# Patient Record
Sex: Male | Born: 1965 | Race: White | Hispanic: No | Marital: Married | Smoking: Former smoker
Health system: Southern US, Community
[De-identification: ages and names within clinical notes are randomized; demographics above are authoritative.]

## PROBLEM LIST (undated history)

## (undated) DIAGNOSIS — E785 Hyperlipidemia, unspecified: Secondary | ICD-10-CM

## (undated) DIAGNOSIS — E119 Type 2 diabetes mellitus without complications: Secondary | ICD-10-CM

## (undated) DIAGNOSIS — E782 Mixed hyperlipidemia: Secondary | ICD-10-CM

## (undated) DIAGNOSIS — U071 COVID-19: Secondary | ICD-10-CM

## (undated) DIAGNOSIS — I10 Essential (primary) hypertension: Secondary | ICD-10-CM

## (undated) DIAGNOSIS — Z87442 Personal history of urinary calculi: Secondary | ICD-10-CM

## (undated) DIAGNOSIS — I2510 Atherosclerotic heart disease of native coronary artery without angina pectoris: Secondary | ICD-10-CM

## (undated) DIAGNOSIS — K219 Gastro-esophageal reflux disease without esophagitis: Secondary | ICD-10-CM

## (undated) HISTORY — DX: COVID-19: U07.1

## (undated) HISTORY — DX: Type 2 diabetes mellitus without complications: E11.9

## (undated) HISTORY — DX: Essential (primary) hypertension: I10

## (undated) HISTORY — DX: Mixed hyperlipidemia: E78.2

## (undated) HISTORY — PX: LACERATION REPAIR: SHX5168

## (undated) HISTORY — DX: Gastro-esophageal reflux disease without esophagitis: K21.9

---
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
## 2007-12-07 ENCOUNTER — Ambulatory Visit (HOSPITAL_COMMUNITY): Admission: RE | Admit: 2007-12-07 | Discharge: 2007-12-07 | Payer: Self-pay | Admitting: Cardiovascular Disease

## 2010-08-03 ENCOUNTER — Ambulatory Visit: Payer: Self-pay | Admitting: Internal Medicine

## 2010-08-03 ENCOUNTER — Encounter (INDEPENDENT_AMBULATORY_CARE_PROVIDER_SITE_OTHER): Payer: Self-pay

## 2010-08-03 DIAGNOSIS — R112 Nausea with vomiting, unspecified: Secondary | ICD-10-CM

## 2010-08-03 DIAGNOSIS — R198 Other specified symptoms and signs involving the digestive system and abdomen: Secondary | ICD-10-CM | POA: Insufficient documentation

## 2010-08-03 DIAGNOSIS — R1013 Epigastric pain: Secondary | ICD-10-CM

## 2010-08-03 DIAGNOSIS — R634 Abnormal weight loss: Secondary | ICD-10-CM

## 2010-08-06 ENCOUNTER — Encounter: Payer: Self-pay | Admitting: Internal Medicine

## 2010-12-25 NOTE — Assessment & Plan Note (Signed)
Summary: STOMACH PAINS,ULCERS/SS   Visit Type:  consult Referring Adhrit Krenz:  [facility] ED Primary Care Tykesha Konicki:  None  Chief Complaint:  abd pain x 1 month.  History of Present Illness: Shawn Hughes is a pleasant 45 y/o WM, who presents here at request of [facility] ED for further evaluation of abd pain.   6 weeks h/o epigastric pain that radiates downward. Pain worse pp. Vomiting 2-3 times per week. Has not noticed obvious blood/black emesis, but ?streaks of blood day seen in ED. He states he doesn't look do to a weak stomach. No heartburn or dysphagia. Lots of gas. BM 3 per day for past few weeks. Used to be once daily. Stools loose to watery. No melena, brbpr. Wakes up at night with abd pain and then has stools. No recent Abx, ill contacts. No travel abroad. When abd pain started took ibuprofen 600mg  three times a day. Symptoms got worse. Rarely takes BCs for headache.  Weight down 3-4 pounds in last few weeks.   Labs done [facility] ED 07/10/10: glu 101, BUN 13, Cre 1.02, Tbili 0.6, AP 66, AST 20, ALT 29, alb 4.2, amylase 40, lipase 24, WBC 8100, H/H 16.1/45.9, Plt 257,000, H.Pylori negative.   Current Medications (verified): 1)  Prevacid 30 Mg Cpdr (Lansoprazole) .... Once Daily 2)  Tramadol Hcl 50 Mg Tabs (Tramadol Hcl) .Marland Kitchen.. 1-2 Q 4 Hours Prn  Allergies (verified): 1)  ! Lortab (Hydrocodone-Acetaminophen)  Past History:  Past Medical History: Kidney stones  Past Surgical History: Right arm, deep laceration.  Family History: No CRC, liver, chronic GI illnesses.  Father, DM.  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 Married. Two children. 1/2ppd. No drugs. No alcohol.   Review of Systems General:  Complains of weight loss; denies fever, chills, sweats, anorexia, fatigue, and weakness. Eyes:  Denies vision loss. ENT:  Denies nasal congestion, sore throat, hoarseness, and difficulty swallowing. CV:  Denies chest pains,  angina, palpitations, dyspnea on exertion, and peripheral edema. Resp:  Denies dyspnea at rest, dyspnea with exercise, cough, sputum, and wheezing. GI:  See HPI. GU:  Denies urinary burning and blood in urine. MS:  Denies joint pain / LOM. Derm:  Denies rash and itching. Neuro:  Denies weakness, frequent headaches, memory loss, and confusion. Psych:  Denies depression and anxiety. Endo:  Complains of unusual weight change. Heme:  Denies bruising and bleeding. Allergy:  Denies hives and rash.  Vital Signs:  Patient profile:   45 year old male Height:      69 inches Weight:      191 pounds BMI:     28.31 Temp:     97.6 degrees F oral Pulse rate:   80 / minute BP sitting:   140 / 100  (left arm) Cuff size:   regular  Vitals Entered By: Hendricks Limes LPN (August 03, 2010 10:45 AM)  Physical Exam  General:  Well developed, well nourished, no acute distress. Head:  Normocephalic and atraumatic. Eyes:  sclera nonicteric Mouth:  Oropharyngeal mucosa moist, pink.  No lesions, erythema or exudate.    Neck:  Supple; no masses or thyromegaly. Lungs:  Clear throughout to auscultation. Heart:  Regular rate and rhythm; no murmurs, rubs,  or bruits. Abdomen:  Soft. Mild epigastric tenderness. No rebound or guarding. No HSM or masses. No abd bruit or hernia.  Extremities:  No clubbing, cyanosis, edema or deformities noted. Neurologic:  Alert and  oriented x4;  grossly normal neurologically.  Skin:  Intact without significant lesions or rashes. Cervical Nodes:  No significant cervical adenopathy. Psych:  Alert and cooperative. Normal mood and affect.  Impression & Recommendations:  Problem # 1:  EPIGASTRIC PAIN (ICD-789.06)  Epigastric pain, worse pp associated with N/V, possible hematemesis, bowel change. DDx includes PUD, gallbladder disease. Doubt celiac disease. More difficulty to tie in change in bowels related to upper gi source, but patient under lot of stress at work and may have  element of IBS. Discussed options with patient including EGD and abd u/s. EGD to be performed in near future.  Risks, alternatives, benefits including but not limited to risk of reaction to medications, bleeding, infection, and perforation addressed.  Patient voiced understanding and verbal consent obtained. If EGD negative, then consider abd u/s as next step.  Dexilant 60mg  daily, #20 given. Patient out of Prevacid. He should call if symptoms worsen.   Orders: Consultation Level III (19147) I would like to thank [facility] ED for allowing [place] to take part in the care of this nice patient.

## 2010-12-25 NOTE — Letter (Signed)
Summary: Out of Work Note  [facility]  [address]   [place], [place] [address]   Phone: [phone]  Fax: [phone]    08/03/2010  TO: Leodis Sias IT MAY CONCERN  RE: Shawn Hughes [address] [id number] Jun 30, 1966       The above named individual was seen in our office today.      If you have any further questions or need additional information, please call.     Sincerely,     [facility] R. Roetta Sessions, M.D.    Jonette Eva, M.D. Lorenza Burton, FNP-BC    Tana Coast, PA-C Phone: [phone]    Fax: [phone]

## 2010-12-25 NOTE — Letter (Signed)
Summary: EGD ORDER  EGD ORDER   Imported By: Ave Filter 08/03/2010 11:28:47  _____________________________________________________________________  External Attachment:    Type:   Image     Comment:   External Document

## 2010-12-25 NOTE — Letter (Signed)
Summary: RECORDS FROM [facility]  RECORDS FROM [facility]   Imported By: Rexene Alberts 08/06/2010 16:36:31  _____________________________________________________________________  External Attachment:    Type:   Image     Comment:   External Document

## 2012-01-08 ENCOUNTER — Encounter: Payer: Self-pay | Admitting: Internal Medicine

## 2012-01-09 ENCOUNTER — Encounter: Payer: Self-pay | Admitting: Gastroenterology

## 2012-01-09 ENCOUNTER — Ambulatory Visit (INDEPENDENT_AMBULATORY_CARE_PROVIDER_SITE_OTHER): Payer: BC Managed Care – PPO | Admitting: Gastroenterology

## 2012-01-09 DIAGNOSIS — R079 Chest pain, unspecified: Secondary | ICD-10-CM | POA: Insufficient documentation

## 2012-01-09 DIAGNOSIS — R1013 Epigastric pain: Secondary | ICD-10-CM

## 2012-01-09 MED ORDER — SODIUM CHLORIDE 0.45 % IV SOLN
Freq: Once | INTRAVENOUS | Status: AC
  Administered 2012-01-10: 15:00:00 via INTRAVENOUS

## 2012-01-09 NOTE — Patient Instructions (Signed)
UPPER ENDOSCOPY TOMORROW.  WE WILL GET YOUR RECORDS FROM [facility].  WE WILL SCHEDULE FOLLOW UP AFTER YOUR UPPER ENDOSCOPY IS DONE.

## 2012-01-09 NOTE — Progress Notes (Signed)
  Subjective:    Patient ID: Shawn Hughes, male    DOB: 23-Apr-1966, 46 y.o.   MRN: [id number]  1o GI: Shawn Hughes PCP: Shawn Hughes  HPI 2009: CHEST PAIN CTA NECK/CHEST NL  Last seen SEP 2011 AT [facility] FOR HEMATEMESIS/ABDOMINAL PAIN, USING BCs-191 LBS. Remembers Prevacid helped and so he did not have EGD. SMOKING 1/2 PPD. NO ETOH.  HAVING CHEST PAIN AND ADMITTED AND DISCHARGED FROM [facility]. Xanax 0.5 mg (1/2 to 1) gave him crazy dreams and couldn't function. HAVING STRESS FROM WORK. He HAS chest pain  in his LEFT chest and moves to left shoulder/neck. HAS DULL PAIN ALL DAY LONG AND WHEN IT GETS SHARP THEN SHOOTS UP INTO HIS NECK. HAS EPIGASTRIC PAIN AS WELL. NO REPEAT IMAGING OR EGD. NO HEARTBURN, NAUSEA, OR VOMITING, OR PROBLEMS SWALLOWING. Unchanged by food. No ASA, BC, GOODYS. USES IBUPROFEN ONCE A DAY. 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 Appetite: poor. NO CHANGE IN BOWEL HABITS. PRILOSEC ONCE A DAY BEFORE MEALS-4 WEEKS AND NO CHANGE IN YOUR PAIN. No injury to back or chest. No blood in Stool, tarry stools, dysuria, hematuria. Cut back on smoking since 4 weeks: 6 or 7 a day.  Past Medical History  Diagnosis Date  . Kidney stone    Past Surgical History  Procedure Date  . Laceration repair     right arm   Allergies  Allergen Reactions  . Hydrocodone-Acetaminophen Itching    Current Outpatient Prescriptions  Medication Sig Dispense Refill  . omeprazole (PRILOSEC) 20 MG capsule Take 20 mg by mouth daily.        Family History  Problem Relation Age of Onset  . Colon cancer Neg Hx   . Colon polyps Neg Hx   . Esophageal cancer Neg Hx   . Pancreatic cancer Neg Hx   . Stomach cancer Neg Hx     History  Substance Use Topics  . Smoking status: Current Everyday Smoker -- 0.5 packs/day    Types: Cigarettes  . Smokeless tobacco: Not on file  . Alcohol Use: No       Review of Systems  All other systems reviewed and are  negative.       Objective:   Physical Exam  Constitutional: He is oriented to person, place, and time. He appears well-developed and well-nourished. No distress.       BREATH SMELL LIKE SMOKE  HENT:  Head: Normocephalic and atraumatic.  Mouth/Throat: Oropharynx is clear and moist. No oropharyngeal exudate.       POOR DENTITION  Eyes: Pupils are equal, round, and reactive to light. No scleral icterus.  Neck: Normal range of motion. Neck supple.  Cardiovascular: Normal rate and normal heart sounds.   Pulmonary/Chest: Effort normal and breath sounds normal. No respiratory distress.  Abdominal: Soft. Bowel sounds are normal. He exhibits no distension. There is tenderness (MILD TTP IN EPIGASTRIUM). There is no rebound and no guarding.  Musculoskeletal: Normal range of motion. He exhibits no edema.  Lymphadenopathy:    He has no cervical adenopathy.  Neurological: He is alert and oriented to person, place, and time.       NO FOCAL DEFICITS   Psychiatric: He has a normal mood and affect.          Assessment & Plan:

## 2012-01-09 NOTE — Assessment & Plan Note (Signed)
CONSTANT  & unchanged by food, which is not typical for GI chest pain. Pt may have non-gi cause for pain, doubt atypical reflux or esophageal spasm, less likely occult malignancy. RISK FACTOR FOR MALIGNANCY: TOBACCO USE  EGD TOMORROW. [facility]PV TBS AFTER EGD CONSIDER CT CHEST/ABDOMEN IF NO SIGNIFICANT FINDINGS ON EGD. MAY NEED [facility] TO EVALUATE FOR ESOPHAGEAL SPASM.

## 2012-01-09 NOTE — Assessment & Plan Note (Signed)
?   NSAID GASTRITIS OR PUD WITH RADIATION TO CHEST/NECK.  EGD TOMORROW. [facility]PV TBS AFTER EGD CONSIDER CT CHEST/ABDOMEN IF NO SIGNIFICANT FINDINGS ON EGD. MAY NEED BASW TO EVALUATE FOR ESOPHAGEAL SPASM.

## 2012-01-10 ENCOUNTER — Encounter (HOSPITAL_COMMUNITY)
Admission: RE | Disposition: A | Discharge status: Home / Self Care | Payer: Self-pay | Source: Ambulatory Visit | Attending: Gastroenterology

## 2012-01-10 ENCOUNTER — Other Ambulatory Visit: Payer: Self-pay | Admitting: Gastroenterology

## 2012-01-10 ENCOUNTER — Ambulatory Visit (HOSPITAL_COMMUNITY)
Admission: RE | Admit: 2012-01-10 | Discharge: 2012-01-10 | Disposition: A | Discharge status: Home / Self Care | Payer: BC Managed Care – PPO | Source: Ambulatory Visit | Attending: Gastroenterology | Admitting: Gastroenterology

## 2012-01-10 ENCOUNTER — Encounter (HOSPITAL_COMMUNITY): Payer: Self-pay

## 2012-01-10 DIAGNOSIS — K319 Disease of stomach and duodenum, unspecified: Secondary | ICD-10-CM | POA: Insufficient documentation

## 2012-01-10 DIAGNOSIS — R1110 Vomiting, unspecified: Secondary | ICD-10-CM | POA: Insufficient documentation

## 2012-01-10 DIAGNOSIS — K2990 Gastroduodenitis, unspecified, without bleeding: Secondary | ICD-10-CM

## 2012-01-10 DIAGNOSIS — K222 Esophageal obstruction: Secondary | ICD-10-CM | POA: Insufficient documentation

## 2012-01-10 DIAGNOSIS — R079 Chest pain, unspecified: Secondary | ICD-10-CM

## 2012-01-10 DIAGNOSIS — K2940 Chronic atrophic gastritis without bleeding: Secondary | ICD-10-CM | POA: Insufficient documentation

## 2012-01-10 DIAGNOSIS — R1013 Epigastric pain: Secondary | ICD-10-CM | POA: Insufficient documentation

## 2012-01-10 DIAGNOSIS — K2970 Gastritis, unspecified, without bleeding: Secondary | ICD-10-CM

## 2012-01-10 HISTORY — PX: ESOPHAGOGASTRODUODENOSCOPY: SHX5428

## 2012-01-10 SURGERY — EGD (ESOPHAGOGASTRODUODENOSCOPY)
Anesthesia: Moderate Sedation

## 2012-01-10 MED ORDER — MIDAZOLAM HCL 5 MG/5ML IJ SOLN
INTRAMUSCULAR | Status: AC
  Filled 2012-01-10: qty 10

## 2012-01-10 MED ORDER — OMEPRAZOLE 20 MG PO CPDR: DELAYED_RELEASE_CAPSULE | ORAL | Status: DC

## 2012-01-10 MED ORDER — MEPERIDINE HCL 100 MG/ML IJ SOLN
INTRAMUSCULAR | Status: AC
  Filled 2012-01-10: qty 2

## 2012-01-10 MED ORDER — MEPERIDINE HCL 100 MG/ML IJ SOLN
INTRAMUSCULAR | Status: DC | PRN
  Administered 2012-01-10 (×2): 50 mg via INTRAVENOUS

## 2012-01-10 MED ORDER — BUTAMBEN-TETRACAINE-BENZOCAINE 2-2-14 % EX AERO
INHALATION_SPRAY | CUTANEOUS | Status: DC | PRN
  Administered 2012-01-10: 2 via TOPICAL

## 2012-01-10 MED ORDER — MIDAZOLAM HCL 5 MG/5ML IJ SOLN
INTRAMUSCULAR | Status: DC | PRN
  Administered 2012-01-10 (×2): 2 mg via INTRAVENOUS

## 2012-01-10 MED ORDER — STERILE WATER FOR IRRIGATION IR SOLN
Status: DC | PRN
  Administered 2012-01-10: 15:00:00

## 2012-01-10 NOTE — Progress Notes (Signed)
Faxed to PCP

## 2012-01-10 NOTE — H&P (View-Only) (Signed)
  Subjective:    Patient ID: Shawn Hughes, male    DOB: 07/16/1966, 45 y.o.   MRN: [id number]  1o GI: Briggitte Boline PCP: [facility]  HPI 2009: CHEST PAIN CTA NECK/CHEST NL  Last seen SEP 2011 AT [facility] FOR HEMATEMESIS/ABDOMINAL PAIN, USING BCs-191 LBS. Remembers Prevacid helped and so he did not have EGD. SMOKING 1/2 PPD. NO ETOH.  HAVING CHEST PAIN AND ADMITTED AND DISCHARGED FROM [facility]. Xanax 0.5 mg (1/2 to 1) gave him crazy dreams and couldn't function. HAVING STRESS FROM WORK. He HAS chest pain  in his LEFT chest and moves to left shoulder/neck. HAS DULL PAIN ALL DAY LONG AND WHEN IT GETS SHARP THEN SHOOTS UP INTO HIS NECK. HAS EPIGASTRIC PAIN AS WELL. NO REPEAT IMAGING OR EGD. NO HEARTBURN, NAUSEA, OR VOMITING, OR PROBLEMS SWALLOWING. Unchanged by food. No ASA, BC, GOODYS. USES IBUPROFEN ONCE A DAY. 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 Appetite: poor. NO CHANGE IN BOWEL HABITS. PRILOSEC ONCE A DAY BEFORE MEALS-4 WEEKS AND NO CHANGE IN YOUR PAIN. No injury to back or chest. No blood in Stool, tarry stools, dysuria, hematuria. Cut back on smoking since 4 weeks: 6 or 7 a day.  Past Medical History  Diagnosis Date  . Kidney stone    Past Surgical History  Procedure Date  . Laceration repair     right arm   Allergies  Allergen Reactions  . Hydrocodone-Acetaminophen Itching    Current Outpatient Prescriptions  Medication Sig Dispense Refill  . omeprazole (PRILOSEC) 20 MG capsule Take 20 mg by mouth daily.        Family History  Problem Relation Age of Onset  . Colon cancer Neg Hx   . Colon polyps Neg Hx   . Esophageal cancer Neg Hx   . Pancreatic cancer Neg Hx   . Stomach cancer Neg Hx     History  Substance Use Topics  . Smoking status: Current Everyday Smoker -- 0.5 packs/day    Types: Cigarettes  . Smokeless tobacco: Not on file  . Alcohol Use: No       Review of Systems  All other systems reviewed and are  negative.       Objective:   Physical Exam  Constitutional: He is oriented to person, place, and time. He appears well-developed and well-nourished. No distress.       BREATH SMELL LIKE SMOKE  HENT:  Head: Normocephalic and atraumatic.  Mouth/Throat: Oropharynx is clear and moist. No oropharyngeal exudate.       POOR DENTITION  Eyes: Pupils are equal, round, and reactive to light. No scleral icterus.  Neck: Normal range of motion. Neck supple.  Cardiovascular: Normal rate and normal heart sounds.   Pulmonary/Chest: Effort normal and breath sounds normal. No respiratory distress.  Abdominal: Soft. Bowel sounds are normal. He exhibits no distension. There is tenderness (MILD TTP IN EPIGASTRIUM). There is no rebound and no guarding.  Musculoskeletal: Normal range of motion. He exhibits no edema.  Lymphadenopathy:    He has no cervical adenopathy.  Neurological: He is alert and oriented to person, place, and time.       NO FOCAL DEFICITS   Psychiatric: He has a normal mood and affect.          Assessment & Plan:   

## 2012-01-10 NOTE — Discharge Instructions (Signed)
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 I biopsied your stomach.  TAKE OMEPRAZOLE 30 MINUTES PRIOR TO MEALS TWICE DAILY FOR 3 MOS THEN ONCE DAILY FOR THE NEXT YEAR. AVOID TRIGGERS FOR GASTRITIS. SEE INFO BELOW. FOLLOW A LOW FAT DIET. SEE INFO BELOW. FOLLOW UP IN 2 MOS.  UPPER ENDOSCOPY AFTER CARE Read the instructions outlined below and refer to this sheet in the next week. These discharge instructions provide you with general information on caring for yourself after you leave the hospital. While your treatment has been planned according to the most current medical practices available, unavoidable complications occasionally occur. If you have any problems or questions after discharge, call DR. Anila Bojarski, [phone].  ACTIVITY  You may resume your regular activity, but move at a slower pace for the next 24 hours.   Take frequent rest periods for the next 24 hours.   Walking will help get rid of the air and reduce the bloated feeling in your belly (abdomen).   No driving for 24 hours (because of the medicine (anesthesia) used during the test).   You may shower.   Do not sign any important legal documents or operate any machinery for 24 hours (because of the anesthesia used during the test).    NUTRITION  Drink plenty of fluids.   You may resume your normal diet as instructed by your doctor.   Begin with a light meal and progress to your normal diet. Heavy or fried foods are harder to digest and may make you feel sick to your stomach (nauseated).   Avoid alcoholic beverages for 24 hours or as instructed.    MEDICATIONS  You may resume your normal medications.   WHAT YOU CAN EXPECT TODAY  Some feelings of bloating in the abdomen.   Passage of more gas than usual.    IF YOU HAD A BIOPSY TAKEN DURING THE UPPER ENDOSCOPY:  Eat a soft diet IF YOU HAVE NAUSEA, BLOATING, ABDOMINAL PAIN, OR VOMITING.    FINDING OUT THE RESULTS OF YOUR TEST Not  all test results are available during your visit. DR. Darrick Penna WILL CALL YOU WITHIN 7 DAYS OF YOUR PROCEDUE WITH YOUR RESULTS. Do not assume everything is normal if you have not heard from DR. Seleni Meller IN ONE WEEK, CALL HER OFFICE AT [phone].  SEEK IMMEDIATE MEDICAL ATTENTION AND CALL THE OFFICE: [phone] IF:  You have more than a spotting of blood in your stool.   Your belly is swollen (abdominal distention).   You are nauseated or vomiting.   You have a temperature over 101F.   You have abdominal pain or discomfort that is severe or gets worse throughout the day.   Gastritis  Gastritis is an inflammation (the body's way of reacting to injury and/or infection) of the stomach. It is often caused by viral or bacterial (germ) infections. It can also be caused BY ASPIRIN, BC/GOODY POWDER'S, (IBUPROFEN) MOTRIN, OR ALEVE (NAPROXEN), chemicals (including alcohol), SPICY FOODS, and medications. This illness may be associated with generalized malaise (feeling tired, not well), UPPER ABDOMINAL STOMACH cramps, and fever. One common bacterial cause of gastritis is an organism known as H. Pylori. This can be treated with antibiotics.    Low-Fat Diet  BREADS, CEREALS, PASTA, RICE, DRIED PEAS, AND BEANS These products are high in carbohydrates and most are low in fat. Therefore, they can be increased in the diet as substitutes for fatty foods. They too, however, contain calories and should not be eaten  in excess. Cereals can be eaten for snacks as well as for breakfast.  Include foods that contain fiber (fruits, vegetables, whole grains, and legumes). Research shows that fiber may lower blood cholesterol levels, especially the water-soluble fiber found in fruits, vegetables, oat products, and legumes.  FRUITS AND VEGETABLES It is good to eat fruits and vegetables. Besides being sources of fiber, both are rich in vitamins and some minerals. They help you get the daily allowances of these nutrients.  Fruits and vegetables can be used for snacks and desserts.  MEATS Limit lean meat, chicken, [place], and fish to no more than 6 ounces per day.  Beef, Pork, and Lamb Use lean cuts of beef, pork, and lamb. Lean cuts include:  Extra-lean ground beef.  Arm roast.  Sirloin tip.  Center-cut ham.  Round steak.  Loin chops.  Rump roast.  Tenderloin.  Trim all fat off the outside of meats before cooking. It is not necessary to severely decrease the intake of red meat, but lean choices should be made. Lean meat is rich in protein and contains a highly absorbable form of iron. Premenopausal women, in particular, should avoid reducing lean red meat because this could increase the risk for low red blood cells (iron-deficiency anemia).  Chicken and [place] These are good sources of protein. The fat of poultry can be reduced by removing the skin and underlying fat layers before cooking. Chicken and [place] can be substituted for lean red meat in the diet. Poultry should not be fried or covered with high-fat sauces.  Fish and Shellfish Fish is a good source of protein. Shellfish contain cholesterol, but they usually are low in saturated fatty acids. The preparation of fish is important. Like chicken and [place], they should not be fried or covered with high-fat sauces.  EGGS Egg whites contain no fat or cholesterol. They can be eaten often. Try 1 to 2 egg whites instead of whole eggs in recipes or use egg substitutes that do not contain yolk.  MILK AND DAIRY PRODUCTS Use skim or 1% milk instead of 2% or whole milk. Decrease whole milk, natural, and processed cheeses. Use nonfat or low-fat (2%) cottage cheese or low-fat cheeses made from vegetable oils. Choose nonfat or low-fat (1 to 2%) yogurt. Experiment with evaporated skim milk in recipes that call for heavy cream. Substitute low-fat yogurt or low-fat cottage cheese for sour cream in dips and salad dressings. Have at least 2 servings of low-fat dairy  products, such as 2 glasses of skim (or 1%) milk each day to help get your daily calcium intake.  FATS AND OILS Reduce the total intake of fats, especially saturated fat. Butterfat, lard, and beef fats are high in saturated fat and cholesterol. These should be avoided as much as possible. Vegetable fats do not contain cholesterol, but certain vegetable fats, such as coconut oil, palm oil, and palm kernel oil are very high in saturated fats. These should be limited. These fats are often used in bakery goods, processed foods, popcorn, oils, and nondairy creamers. Vegetable shortenings and some peanut butters contain hydrogenated oils, which are also saturated fats. Read the labels on these foods and check for saturated vegetable oils.  Unsaturated vegetable oils and fats do not raise blood cholesterol. However, they should be limited because they are fats and are high in calories. Total fat should still be limited to 30% of your daily caloric intake. Desirable liquid vegetable oils are corn oil, cottonseed oil, olive oil, canola oil, safflower oil,  soybean oil, and sunflower oil. Peanut oil is not as good, but small amounts are acceptable. Buy a heart-healthy tub margarine that has no partially hydrogenated oils in the ingredients. Mayonnaise and salad dressings often are made from unsaturated fats, but they should also be limited because of their high calorie and fat content. Seeds, nuts, peanut butter, olives, and avocados are high in fat, but the fat is mainly the unsaturated type. These foods should be limited mainly to avoid excess calories and fat.  OTHER EATING TIPS Snacks  Most sweets should be limited as snacks. They tend to be rich in calories and fats, and their caloric content outweighs their nutritional value. Some good choices in snacks are graham crackers, melba toast, soda crackers, bagels (no egg), English muffins, fruits, and vegetables. These snacks are preferable to snack crackers, [place]  fries, and chips. Popcorn should be air-popped or cooked in small amounts of liquid vegetable oil.  Desserts Eat fruit, low-fat yogurt, and fruit ices instead of pastries, cake, and cookies. Sherbet, angel food cake, gelatin dessert, frozen low-fat yogurt, or other frozen products that do not contain saturated fat (pure fruit juice bars, frozen ice pops) are also acceptable.   COOKING METHODS Choose those methods that use little or no fat. They include: Poaching.  Braising.  Steaming.  Grilling.  Baking.  Stir-frying.  Broiling.  Microwaving.  Foods can be cooked in a nonstick pan without added fat, or use a nonfat cooking spray in regular cookware. Limit fried foods and avoid frying in saturated fat. Add moisture to lean meats by using water, broth, cooking wines, and other nonfat or low-fat sauces along with the cooking methods mentioned above. Soups and stews should be chilled after cooking. The fat that forms on top after a few hours in the refrigerator should be skimmed off. When preparing meals, avoid using excess salt. Salt can contribute to raising blood pressure in some people.  EATING AWAY FROM HOME Order entres, potatoes, and vegetables without sauces or butter. When meat exceeds the size of a deck of cards (3 to 4 ounces), the rest can be taken home for another meal. Choose vegetable or fruit salads and ask for low-calorie salad dressings to be served on the side. Use dressings sparingly. Limit high-fat toppings, such as bacon, crumbled eggs, cheese, sunflower seeds, and olives. Ask for heart-healthy tub margarine instead of butter.

## 2012-01-10 NOTE — Interval H&P Note (Signed)
History and Physical Interval Note:  01/10/2012 2:25 PM  Shawn Hughes  has presented today for surgery, with the diagnosis of Chest pain  The various methods of treatment have been discussed with the patient and family. After consideration of risks, benefits and other options for treatment, the patient has consented to  Procedure(s) (LRB): ESOPHAGOGASTRODUODENOSCOPY (EGD) (N/A) as a surgical intervention .  The patients' history has been reviewed, patient examined, no change in status, stable for surgery.  I have reviewed the patients' chart and labs.  Questions were answered to the patient's satisfaction.     [employer]

## 2012-01-13 NOTE — Op Note (Signed)
[facility] [address] [place], [place]  [address]  ENDOSCOPY PROCEDURE REPORT  PATIENT:  Shawn, Hughes  MR#:  [id number] BIRTHDATE:  1966-03-10, 45 yrs. old  GENDER:  male  ENDOSCOPIST:  Jonette Eva, MD Referred by:  Karleen Hampshire, M.D.  PROCEDURE DATE:  01/10/2012 PROCEDURE:  EGD with biopsy, [address] ASA CLASS: INDICATIONS:  CHEST PAIN, VOMITING  MEDICATIONS:   Demerol 100 mg IV, Versed 4 mg IV TOPICAL ANESTHETIC:  Cetacaine Spray  DESCRIPTION OF PROCEDURE:     Physical exam was performed. Informed consent was obtained from the patient after explaining the benefits, risks, and alternatives to the procedure.  The patient was connected to the monitor and placed in the left lateral position.  Continuous oxygen was provided by nasal cannula and IV medicine administered through an indwelling cannula.  After administration of sedation, the patient's esophagus was intubated and the EG-[id number] ([id number]) endoscope was advanced under direct visualization to the second portion of the duodenum.  The scope was removed slowly by carefully examining the color, texture, anatomy, and integrity of the mucosa on the way out.  The patient was recovered in endoscopy and discharged home in satisfactory condition. <<PROCEDUREIMAGES>>  A PATENT stricture was found in the distal esophagus.  Mild gastritis was found & BIOPSIED VIA COLD FORCEPS. NL DUODENUM. NO BARRETT''S.  COMPLICATIONS:    None  ENDOSCOPIC IMPRESSION: 1) Stricture in the distal esophagus2O TO GERD 2) Mild gastritis  RECOMMENDATIONS: OMP BID AWAIT BIOPSIES OPV IN 2 MOS CONSIDER CHEST CT LOW FAT DIET AVOID GASTRTIS TRIGGERS  REPEAT EXAM:  No  ______________________________ Jonette Eva, MD  CC:  n. eSIGNED:   Marvelene Stoneberg at 01/10/2012 04:40 PM  Susa Raring, [phone]

## 2012-01-16 ENCOUNTER — Encounter (HOSPITAL_COMMUNITY): Payer: Self-pay | Admitting: Gastroenterology

## 2012-01-17 ENCOUNTER — Telehealth: Payer: Self-pay | Admitting: Gastroenterology

## 2012-01-17 NOTE — Telephone Encounter (Signed)
Please call pt. His stomach Bx shows gastritis.   TAKE OMEPRAZOLE 30 MINUTES PRIOR TO MEALS TWICE DAILY FOR 3 MOS THEN ONCE DAILY FOR THE NEXT YEAR. AVOID TRIGGERS FOR GASTRITIS.  FOLLOW A LOW FAT DIET.  FOLLOW UP IN 2 MOS.

## 2012-01-20 NOTE — Telephone Encounter (Signed)
LMOM to call.

## 2012-01-21 NOTE — Telephone Encounter (Signed)
Pt called and was informed of the results.  

## 2012-01-22 NOTE — Telephone Encounter (Signed)
Results Cc to PCP  

## 2012-01-22 NOTE — Telephone Encounter (Signed)
Reminder in epic to follow up in 2 months 

## 2013-08-12 ENCOUNTER — Encounter: Payer: Self-pay | Admitting: Gastroenterology

## 2013-08-16 ENCOUNTER — Ambulatory Visit: Payer: BC Managed Care – PPO | Admitting: Gastroenterology

## 2013-09-02 ENCOUNTER — Encounter: Payer: Self-pay | Admitting: Gastroenterology

## 2013-09-02 ENCOUNTER — Ambulatory Visit (INDEPENDENT_AMBULATORY_CARE_PROVIDER_SITE_OTHER): Payer: BC Managed Care – PPO | Admitting: Gastroenterology

## 2013-09-02 ENCOUNTER — Encounter (INDEPENDENT_AMBULATORY_CARE_PROVIDER_SITE_OTHER): Payer: Self-pay

## 2013-09-02 VITALS — BP 143/88 | HR 82 | Temp 97.6°F | Ht 69.0 in | Wt 199.0 lb

## 2013-09-02 DIAGNOSIS — R195 Other fecal abnormalities: Secondary | ICD-10-CM

## 2013-09-02 DIAGNOSIS — K219 Gastro-esophageal reflux disease without esophagitis: Secondary | ICD-10-CM

## 2013-09-02 MED ORDER — DICYCLOMINE HCL 10 MG PO CAPS: 10.0000 mg | ORAL_CAPSULE | Freq: Three times a day (TID) | ORAL | Status: DC

## 2013-09-02 MED ORDER — PANTOPRAZOLE SODIUM 40 MG PO TBEC: 40.0000 mg | DELAYED_RELEASE_TABLET | Freq: Every day | ORAL | Status: DC

## 2013-09-02 NOTE — Patient Instructions (Signed)
I have sent a prescription for Protonix to your pharmacy. This is for reflux. Take it each morning on an empty stomach, 30 minutes before breakfast.  For abdominal discomfort and frequent bowel movements: take Bentyl 1 capsule with meals and at bedtime.   Please complete the stool samples and return to the lab.   Please let us know how you are doing next week.

## 2013-09-02 NOTE — Assessment & Plan Note (Signed)
Increased from baseline, associated with constant abdominal pain, postprandial component. No rectal bleeding. CT from [facility] requested. Doubt infectious process; will obtain Cdiff PCR, stool culture, Giardia, provide Bentyl, and consider colonoscopy in future if no improvement with supportive measures.

## 2013-09-02 NOTE — Assessment & Plan Note (Signed)
Not on a PPI. 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 Notes intermittent indigestion with occasional postprandial vomiting. Restart PPI daily; I have sent Protonix to the pharmacy.

## 2013-09-02 NOTE — Progress Notes (Signed)
Referring Provider: Karleen Hampshire, MD Primary Care Physician:  Kirk Ruths, MD Primary GI: Dr. Darrick Penna   Chief Complaint  Patient presents with  . Abdominal Pain  . Diarrhea    sometimes 4 times a day    HPI:   Shawn Hughes presents today secondary to persistent diarrhea. He was last seen in Feb 2013 and underwent an EGD by Dr. Darrick Penna showing a stricture in the distal esophagus secondary to GERD and mild gastritis.  He returns today stating a month long history of lower abdominal pain, constant, associated with 2-3 soft/loose stools per day, postprandial. No rectal bleeding. Denies abx exposure, sick contacts, med changes. Has well water. Notes weight loss of about 11 lbs since symptom onset. Denies NSAIDs, aspirin powders. No fever, chills. Occasional rectal itching but no rectal bleeding.  No PPI currently. Sometimes notes indigestion, occasional vomiting after eating but not often.   [place]: had CT. No reports available at time of visit.   Past Medical History  Diagnosis Date  . Kidney stone     Past Surgical History  Procedure Laterality Date  . Laceration repair      right arm  . Esophagogastroduodenoscopy  01/10/2012    NGE:[id number] in the distal esophagus2O TO GERD/Mild gastritis    Current Outpatient Prescriptions  Medication Sig Dispense Refill  . omeprazole (PRILOSEC) 20 MG capsule 1 po 30 minutes prior to meals bid  62 capsule  11   No current facility-administered medications for this visit.    Allergies as of 09/02/2013 - Review Complete 09/02/2013  Allergen Reaction Noted  . Hydrocodone-acetaminophen Itching     Family History  Problem Relation Age of Onset  . Colon cancer Neg Hx   . Colon polyps Neg Hx   . Esophageal cancer Neg Hx   . Pancreatic cancer Neg Hx   . Stomach cancer Neg Hx     History   Social History  . Marital Status: Married    Spouse Name: N/A    Number of Children: N/A  . Years of Education: N/A   Social History  Main Topics  . Smoking status: Current Every Day Smoker -- 0.50 packs/day    Types: Cigarettes  . Smokeless tobacco: None  . Alcohol Use: No  . Drug Use: No  . Sexual Activity: None   Other Topics Concern  . None   Social History Narrative  . None    Review of Systems: As mentioned in HPI.   Physical Exam: BP 143/88  Pulse 82  Temp(Src) 97.6 F (36.4 C) (Oral)  Ht 5\' 9"  (1.753 m)  Wt 199 lb (90.266 kg)  BMI 29.37 kg/m2 General:   Alert and oriented. No distress noted. Pleasant and cooperative.  Head:  Normocephalic and atraumatic. Eyes:  Conjuctiva clear without scleral icterus. Mouth:  Oral mucosa pink and moist. Good dentition. No lesions. Neck:  Supple, without mass or thyromegaly. Heart:  S1, S2 present without murmurs, rubs, or gallops. Regular rate and rhythm. Abdomen:  +BS, soft, TTP lower abdomen, left-sided abdomen and non-distended. No rebound or guarding. No HSM or masses noted. Msk:  Symmetrical without gross deformities. Normal posture. Extremities:  Without edema. Neurologic:  Alert and  oriented x4;  grossly normal neurologically. Skin:  Intact without significant lesions or rashes. Cervical Nodes:  No significant cervical adenopathy. Psych:  Alert and cooperative. Normal mood and affect.

## 2013-09-06 NOTE — Progress Notes (Signed)
cc'd to pcp 

## 2013-10-12 NOTE — Progress Notes (Signed)
CT with contrast from Aug 2014 received from [facility].   Low attenuation lesions in the right hepatic lobe too small to characterize. Colon unremarkable. No findings to explain pain.   Patient has not completed stool studies: he needs to do this so we may know how to help treat.

## 2013-10-19 NOTE — Progress Notes (Signed)
LMOM to call.

## 2013-11-01 NOTE — Progress Notes (Signed)
LMOM to call and also need to know if he has completed the stool studies.

## 2013-11-04 NOTE — Progress Notes (Signed)
Letter to pt to call for information.

## 2015-02-04 ENCOUNTER — Encounter (HOSPITAL_COMMUNITY): Payer: Self-pay | Admitting: *Deleted

## 2015-02-04 ENCOUNTER — Emergency Department (HOSPITAL_COMMUNITY): Payer: BLUE CROSS/BLUE SHIELD

## 2015-02-04 ENCOUNTER — Emergency Department (HOSPITAL_COMMUNITY)
Admission: EM | Admit: 2015-02-04 | Discharge: 2015-02-04 | Disposition: A | Discharge status: Home / Self Care | Payer: BLUE CROSS/BLUE SHIELD | Attending: Emergency Medicine | Admitting: Emergency Medicine

## 2015-02-04 DIAGNOSIS — R1110 Vomiting, unspecified: Secondary | ICD-10-CM | POA: Insufficient documentation

## 2015-02-04 DIAGNOSIS — Z79899 Other long term (current) drug therapy: Secondary | ICD-10-CM | POA: Insufficient documentation

## 2015-02-04 DIAGNOSIS — Z720 Tobacco use: Secondary | ICD-10-CM | POA: Insufficient documentation

## 2015-02-04 DIAGNOSIS — R109 Unspecified abdominal pain: Secondary | ICD-10-CM

## 2015-02-04 DIAGNOSIS — R1031 Right lower quadrant pain: Secondary | ICD-10-CM | POA: Insufficient documentation

## 2015-02-04 DIAGNOSIS — K219 Gastro-esophageal reflux disease without esophagitis: Secondary | ICD-10-CM | POA: Insufficient documentation

## 2015-02-04 DIAGNOSIS — M25551 Pain in right hip: Secondary | ICD-10-CM | POA: Insufficient documentation

## 2015-02-04 DIAGNOSIS — M25552 Pain in left hip: Secondary | ICD-10-CM | POA: Diagnosis not present

## 2015-02-04 DIAGNOSIS — Z87442 Personal history of urinary calculi: Secondary | ICD-10-CM | POA: Insufficient documentation

## 2015-02-04 LAB — URINALYSIS, ROUTINE W REFLEX MICROSCOPIC
BILIRUBIN URINE: NEGATIVE
Glucose, UA: NEGATIVE mg/dL
HGB URINE DIPSTICK: NEGATIVE
NITRITE: NEGATIVE
PH: 6 (ref 5.0–8.0)
SPECIFIC GRAVITY, URINE: 1.025 (ref 1.005–1.030)
UROBILINOGEN UA: 0.2 mg/dL (ref 0.0–1.0)

## 2015-02-04 LAB — CBC WITH DIFFERENTIAL/PLATELET
BASOS PCT: 0 % (ref 0–1)
Basophils Absolute: 0 10*3/uL (ref 0.0–0.1)
EOS PCT: 5 % (ref 0–5)
Eosinophils Absolute: 0.5 10*3/uL (ref 0.0–0.7)
HEMATOCRIT: 45.9 % (ref 39.0–52.0)
HEMOGLOBIN: 16.2 g/dL (ref 13.0–17.0)
LYMPHS ABS: 2.8 10*3/uL (ref 0.7–4.0)
Lymphocytes Relative: 30 % (ref 12–46)
MCH: 33.1 pg (ref 26.0–34.0)
MCHC: 35.3 g/dL (ref 30.0–36.0)
MCV: 93.9 fL (ref 78.0–100.0)
MONO ABS: 0.7 10*3/uL (ref 0.1–1.0)
Monocytes Relative: 8 % (ref 3–12)
Neutro Abs: 5.4 10*3/uL (ref 1.7–7.7)
Neutrophils Relative %: 57 % (ref 43–77)
Platelets: 275 10*3/uL (ref 150–400)
RBC: 4.89 MIL/uL (ref 4.22–5.81)
RDW: 13.2 % (ref 11.5–15.5)
WBC: 9.5 10*3/uL (ref 4.0–10.5)

## 2015-02-04 LAB — BASIC METABOLIC PANEL
Anion gap: 8 (ref 5–15)
BUN: 11 mg/dL (ref 6–23)
CO2: 22 mmol/L (ref 19–32)
CREATININE: 1.24 mg/dL (ref 0.50–1.35)
Calcium: 9.8 mg/dL (ref 8.4–10.5)
Chloride: 108 mmol/L (ref 96–112)
GFR calc Af Amer: 79 mL/min — ABNORMAL LOW (ref >90)
GFR, EST NON AFRICAN AMERICAN: 68 mL/min — AB (ref >90)
Glucose, Bld: 127 mg/dL — ABNORMAL HIGH (ref 70–99)
POTASSIUM: 3.5 mmol/L (ref 3.5–5.1)
SODIUM: 138 mmol/L (ref 135–145)

## 2015-02-04 LAB — URINE MICROSCOPIC-ADD ON

## 2015-02-04 MED ORDER — KETOROLAC TROMETHAMINE 30 MG/ML IJ SOLN
30.0000 mg | Freq: Once | INTRAMUSCULAR | Status: AC
  Administered 2015-02-04: 30 mg via INTRAVENOUS

## 2015-02-04 MED ORDER — KETOROLAC TROMETHAMINE 30 MG/ML IJ SOLN
INTRAMUSCULAR | Status: DC
Start: 2015-02-04 — End: 2015-02-05
  Filled 2015-02-04: qty 1

## 2015-02-04 NOTE — Discharge Instructions (Signed)
Abdominal Pain Take Tylenol or Advil as directed for pain. [facility] if continuing to have significant pain by next week. Your blood pressure rechecked within the next 2 weeks. Today's was elevated at 130/100 Many things can cause belly (abdominal) pain. Most times, the belly pain is not dangerous. Many cases of belly pain can be watched and treated at home. HOME CARE   Do not take medicines that help you go poop (laxatives) unless told to by your doctor.  Only take medicine as told by your doctor.  Eat or drink as told by your doctor. Your doctor will tell you if you should be on a special diet. GET HELP IF:  You do not know what is causing your belly pain.  You have belly pain while you are sick to your stomach (nauseous) or have runny poop (diarrhea).  You have pain while you pee or poop.  Your belly pain wakes you up at night.  You have belly pain that gets worse or better when you eat.  You have belly pain that gets worse when you eat fatty foods.  You have a fever. GET HELP RIGHT AWAY IF:   The pain does not go away within 2 hours.  You keep throwing up (vomiting).  The pain changes and is only in the right or left part of the belly.  You have bloody or tarry looking poop. MAKE SURE YOU:   Understand these instructions.  Will watch your condition.  Will get help right away if you are not doing well or get worse. Document Released: 04/29/2008 Document Revised: 11/16/2013 Document Reviewed: 07/21/2013 [facility] Patient Information 2015 [place]. This information is not intended to replace advice given to you by your health care provider. Make sure you discuss any questions you have with your health care provider.

## 2015-02-04 NOTE — ED Notes (Signed)
Pt with bilateral hip for 1.5 weeks, c/o right mid abd pain today with vomiting

## 2015-02-04 NOTE — ED Notes (Signed)
Wife states that pt has had joint pain in the past before

## 2015-02-04 NOTE — ED Provider Notes (Signed)
CSN: [id number]     Arrival date & time 02/04/15  1800 History   First MD Initiated Contact with Patient 02/04/15 1813     Chief Complaint  Patient presents with  . Hip Pain   Chief complaint abdominal pain  (Consider location/radiation/quality/duration/timing/severity/associated sxs/prior Treatment) HPI Patient complains of right lower quadrant abdominal pain, nonradiating onset 4 hours ago, sharp in quality. Feels like kidney stone he had in the past. He's vomited one time since the event. Denies nausea at present. Pain waxes and wanes, nothing makes symptoms better or worse. Nonradiating. No fever. No other associated symptoms. No treatment prior to coming here pain is mild to moderate presently Past Medical History  Diagnosis Date  . GERD (gastroesophageal reflux disease)   . Kidney stone    Past Surgical History  Procedure Laterality Date  . Laceration repair      right arm  . Esophagogastroduodenoscopy  01/10/2012    WUJ:[id number]:Stricture in the distal esophagus2O TO GERD/Mild gastritis   Family History  Problem Relation Age of Onset  . Colon cancer Neg Hx   . Colon polyps Neg Hx   . Esophageal cancer Neg Hx   . Pancreatic cancer Neg Hx   . Stomach cancer Neg Hx    History  Substance Use Topics  . Smoking status: Current Every Day Smoker -- 0.50 packs/day    Types: Cigarettes  . Smokeless tobacco: Not on file  . Alcohol Use: Yes     Comment: socially    Review of Systems  Constitutional: Negative.   HENT: Negative.   Respiratory: Negative.   Cardiovascular: Negative.   Gastrointestinal: Positive for vomiting and abdominal pain.  Musculoskeletal: Positive for arthralgias.       Bilateral hip pain chronic times several months  Skin: Negative.   Neurological: Negative.   Psychiatric/Behavioral: Negative.   All other systems reviewed and are negative.     Allergies  Hydrocodone  Home Medications   Prior to Admission medications   Medication Sig Start Date End  Date Taking? Authorizing Provider  dicyclomine (BENTYL) 10 MG capsule Take 1 capsule (10 mg total) by mouth 4 (four) times daily -  before meals and at bedtime. 09/02/13   Nira RetortAnna W Sams, NP  pantoprazole (PROTONIX) 40 MG tablet Take 1 tablet (40 mg total) by mouth daily. 30 minutes before breakfast 09/02/13   Nira RetortAnna W Sams, NP   BP 137/103 mmHg  Pulse 114  Temp(Src) 99.1 F (37.3 C) (Oral)  Resp 16  Ht 5\' 9"  (1.753 m)  Wt 205 lb (92.987 kg)  BMI 30.26 kg/m2  SpO2 97% Physical Exam  Constitutional: He appears well-developed and well-nourished.  HENT:  Head: Normocephalic and atraumatic.  Eyes: Conjunctivae are normal. Pupils are equal, round, and reactive to light.  Neck: Neck supple. No tracheal deviation present. No thyromegaly present.  Cardiovascular: Normal rate and regular rhythm.   No murmur heard. Pulmonary/Chest: Effort normal and breath sounds normal.  Abdominal: Soft. Bowel sounds are normal. He exhibits no distension and no mass. There is tenderness. There is no rebound and no guarding.  Tender at right lower quadrant  Genitourinary: Penis normal.  Scrotum normal  Musculoskeletal: Normal range of motion. He exhibits no edema or tenderness.  Neurological: He is alert. Coordination normal.  Skin: Skin is warm and dry. No rash noted.  Psychiatric: He has a normal mood and affect.  Nursing note and vitals reviewed.   ED Course  Procedures (including critical care time) Labs Review Labs Reviewed -  No data to display  Imaging Review No results found.   EKG Interpretation None     Declines pain medicine presently  22:10 PM patient requesting pain medicine. Intravenous Toradol ordered.  At 202:50 PM pain is improved feels ready to go home. Results for orders placed or performed during the hospital encounter of 02/04/15  Basic metabolic panel  Result Value Ref Range   Sodium 138 135 - 145 mmol/L   Potassium 3.5 3.5 - 5.1 mmol/L   Chloride 108 96 - 112 mmol/L   CO2  22 19 - 32 mmol/L   Glucose, Bld 127 (H) 70 - 99 mg/dL   BUN 11 6 - 23 mg/dL   Creatinine, Ser 1.61 0.50 - 1.35 mg/dL   Calcium 9.8 8.4 - 09.6 mg/dL   GFR calc non Af Amer 68 (L) >90 mL/min   GFR calc Af Amer 79 (L) >90 mL/min   Anion gap 8 5 - 15  CBC with Differential/Platelet  Result Value Ref Range   WBC 9.5 4.0 - 10.5 K/uL   RBC 4.89 4.22 - 5.81 MIL/uL   Hemoglobin 16.2 13.0 - 17.0 g/dL   HCT 04.5 40.9 - 81.1 %   MCV 93.9 78.0 - 100.0 fL   MCH 33.1 26.0 - 34.0 pg   MCHC 35.3 30.0 - 36.0 g/dL   RDW 91.4 78.2 - 95.6 %   Platelets 275 150 - 400 K/uL   Neutrophils Relative % 57 43 - 77 %   Neutro Abs 5.4 1.7 - 7.7 K/uL   Lymphocytes Relative 30 12 - 46 %   Lymphs Abs 2.8 0.7 - 4.0 K/uL   Monocytes Relative 8 3 - 12 %   Monocytes Absolute 0.7 0.1 - 1.0 K/uL   Eosinophils Relative 5 0 - 5 %   Eosinophils Absolute 0.5 0.0 - 0.7 K/uL   Basophils Relative 0 0 - 1 %   Basophils Absolute 0.0 0.0 - 0.1 K/uL  Urinalysis, Routine w reflex microscopic  Result Value Ref Range   Color, Urine YELLOW YELLOW   APPearance HAZY (A) CLEAR   Specific Gravity, Urine 1.025 1.005 - 1.030   pH 6.0 5.0 - 8.0   Glucose, UA NEGATIVE NEGATIVE mg/dL   Hgb urine dipstick NEGATIVE NEGATIVE   Bilirubin Urine NEGATIVE NEGATIVE   Ketones, ur TRACE (A) NEGATIVE mg/dL   Protein, ur TRACE (A) NEGATIVE mg/dL   Urobilinogen, UA 0.2 0.0 - 1.0 mg/dL   Nitrite NEGATIVE NEGATIVE   Leukocytes, UA TRACE (A) NEGATIVE  Urine microscopic-add on  Result Value Ref Range   Squamous Epithelial / LPF FEW (A) RARE   WBC, UA 3-6 <3 WBC/hpf   RBC / HPF 3-6 <3 RBC/hpf   Bacteria, UA FEW (A) RARE   Crystals CA OXALATE CRYSTALS (A) NEGATIVE   Urine-Other MUCOUS PRESENT    Ct Renal Stone Study  02/04/2015   CLINICAL DATA:  49 year old male with constant right-sided sharp flank pain radiating into the groin which began this morning.  EXAM: CT ABDOMEN AND PELVIS WITHOUT CONTRAST  TECHNIQUE: Multidetector CT imaging of the  abdomen and pelvis was performed following the standard protocol without IV contrast.  COMPARISON:  No priors.  FINDINGS: Lower chest:  Unremarkable.  Hepatobiliary: No discrete cystic or solid hepatic lesions identified in the liver on today's non contrast CT examination. The unenhanced appearance of the gallbladder is normal.  Pancreas: Unremarkable.  Spleen: Unremarkable.  Adrenals/Urinary Tract: 3 mm nonobstructive calculus in the interpolar collecting system of the  right kidney. No additional calculi are identified within the left renal collecting system, along the course of either ureter, or within the lumen of the urinary bladder. No hydroureteronephrosis or perinephric stranding to indicate urinary tract obstruction at this time. The unenhanced appearance of the kidneys are otherwise normal bilaterally. The unenhanced appearance of the urinary bladder is normal. Bilateral adrenal glands are normal in appearance.  Stomach/Bowel: The unenhanced appearance of the stomach is normal. No pathologic dilatation of small bowel or colon. Normal appendix.  Vascular/Lymphatic: Minimal atherosclerotic calcifications are identified within the abdominal and pelvic vasculature, without evidence of aneurysm. No lymphadenopathy noted in the abdomen or pelvis on today's non contrast CT examination.  Reproductive: Prostate gland and seminal vesicles are unremarkable in appearance.  Other: No significant volume of ascites.  No pneumoperitoneum.  Musculoskeletal: There are no aggressive appearing lytic or blastic lesions noted in the visualized portions of the skeleton.  IMPRESSION: 1. 3 mm nonobstructive calculus in the interpolar collecting system of the right kidney. No ureteral stones or findings of urinary tract obstruction are noted at this time. 2. No other acute findings are noted in the abdomen or pelvis. 3. Normal appendix. 4. Mild atherosclerosis.   Electronically Signed   By: Trudie Reed M.D.   On: 02/04/2015  20:24    MDM  Pain is felt to be nonspecific. Prescription pain medication offered to pt which he declined Final diagnoses:  None  plan Tylenol or apple for pain. Follow-up with PMD if significant pain in the next week Diagnosis #1abdominal pain #2 elevated blood pressure     Doug Sou, MD 02/04/15 [phone]

## 2015-02-04 NOTE — ED Notes (Signed)
Patient with no complaints at this time. Respirations even and unlabored. Skin warm/dry. Discharge instructions reviewed with patient at this time. Patient given opportunity to voice concerns/ask questions. IV removed per policy and band-aid applied to site. Patient discharged at this time and left Emergency Department with steady gait.  

## 2015-06-16 NOTE — Progress Notes (Signed)
REVIEWED-NO ADDITIONAL RECOMMENDATIONS. 

## 2016-07-23 ENCOUNTER — Ambulatory Visit (INDEPENDENT_AMBULATORY_CARE_PROVIDER_SITE_OTHER): Payer: BLUE CROSS/BLUE SHIELD | Admitting: Urology

## 2016-07-23 DIAGNOSIS — N509 Disorder of male genital organs, unspecified: Secondary | ICD-10-CM | POA: Diagnosis not present

## 2016-08-30 ENCOUNTER — Encounter (HOSPITAL_COMMUNITY): Payer: Self-pay

## 2016-08-30 ENCOUNTER — Encounter (HOSPITAL_COMMUNITY)
Admission: EM | Disposition: A | Discharge status: Home / Self Care | Payer: Self-pay | Source: Home / Self Care | Attending: Cardiovascular Disease

## 2016-08-30 ENCOUNTER — Emergency Department (HOSPITAL_COMMUNITY): Payer: BLUE CROSS/BLUE SHIELD

## 2016-08-30 ENCOUNTER — Inpatient Hospital Stay (HOSPITAL_COMMUNITY)
Admission: EM | Admit: 2016-08-30 | Discharge: 2016-09-01 | DRG: 247 | Disposition: A | Discharge status: Home / Self Care | Payer: BLUE CROSS/BLUE SHIELD | Attending: Cardiovascular Disease | Admitting: Cardiovascular Disease

## 2016-08-30 DIAGNOSIS — J9811 Atelectasis: Secondary | ICD-10-CM | POA: Diagnosis present

## 2016-08-30 DIAGNOSIS — I249 Acute ischemic heart disease, unspecified: Secondary | ICD-10-CM | POA: Diagnosis present

## 2016-08-30 DIAGNOSIS — I214 Non-ST elevation (NSTEMI) myocardial infarction: Secondary | ICD-10-CM | POA: Diagnosis present

## 2016-08-30 DIAGNOSIS — Z888 Allergy status to other drugs, medicaments and biological substances status: Secondary | ICD-10-CM

## 2016-08-30 DIAGNOSIS — R000 Tachycardia, unspecified: Secondary | ICD-10-CM | POA: Diagnosis present

## 2016-08-30 DIAGNOSIS — K219 Gastro-esophageal reflux disease without esophagitis: Secondary | ICD-10-CM | POA: Diagnosis present

## 2016-08-30 DIAGNOSIS — I2510 Atherosclerotic heart disease of native coronary artery without angina pectoris: Secondary | ICD-10-CM | POA: Diagnosis not present

## 2016-08-30 DIAGNOSIS — F17210 Nicotine dependence, cigarettes, uncomplicated: Secondary | ICD-10-CM | POA: Diagnosis present

## 2016-08-30 DIAGNOSIS — Z720 Tobacco use: Secondary | ICD-10-CM

## 2016-08-30 DIAGNOSIS — R059 Cough, unspecified: Secondary | ICD-10-CM

## 2016-08-30 DIAGNOSIS — R05 Cough: Secondary | ICD-10-CM

## 2016-08-30 DIAGNOSIS — E785 Hyperlipidemia, unspecified: Secondary | ICD-10-CM | POA: Diagnosis present

## 2016-08-30 HISTORY — DX: Atherosclerotic heart disease of native coronary artery without angina pectoris: I25.10

## 2016-08-30 HISTORY — DX: Hyperlipidemia, unspecified: E78.5

## 2016-08-30 HISTORY — DX: Personal history of urinary calculi: Z87.442

## 2016-08-30 HISTORY — PX: CARDIAC CATHETERIZATION: SHX172

## 2016-08-30 LAB — CBC WITH DIFFERENTIAL/PLATELET
Basophils Absolute: 0 10*3/uL (ref 0.0–0.1)
Basophils Relative: 0 %
Eosinophils Absolute: 0 10*3/uL (ref 0.0–0.7)
Eosinophils Relative: 0 %
HCT: 47.3 % (ref 39.0–52.0)
Hemoglobin: 16.6 g/dL (ref 13.0–17.0)
Lymphocytes Relative: 4 %
Lymphs Abs: 1.1 10*3/uL (ref 0.7–4.0)
MCH: 32.7 pg (ref 26.0–34.0)
MCHC: 35.1 g/dL (ref 30.0–36.0)
MCV: 93.1 fL (ref 78.0–100.0)
Monocytes Absolute: 0.5 10*3/uL (ref 0.1–1.0)
Monocytes Relative: 2 %
Neutro Abs: 25.2 10*3/uL — ABNORMAL HIGH (ref 1.7–7.7)
Neutrophils Relative %: 94 %
Platelets: 288 10*3/uL (ref 150–400)
RBC: 5.08 MIL/uL (ref 4.22–5.81)
RDW: 13.1 % (ref 11.5–15.5)
WBC: 26.8 10*3/uL — ABNORMAL HIGH (ref 4.0–10.5)

## 2016-08-30 LAB — POCT ACTIVATED CLOTTING TIME: ACTIVATED CLOTTING TIME: 252 s

## 2016-08-30 LAB — BASIC METABOLIC PANEL
Anion gap: 9 (ref 5–15)
BUN: 13 mg/dL (ref 6–20)
CO2: 22 mmol/L (ref 22–32)
Calcium: 9.4 mg/dL (ref 8.9–10.3)
Chloride: 102 mmol/L (ref 101–111)
Creatinine, Ser: 0.81 mg/dL (ref 0.61–1.24)
GFR calc Af Amer: >60 mL/min (ref >60)
GFR calc non Af Amer: >60 mL/min (ref >60)
Glucose, Bld: 278 mg/dL — ABNORMAL HIGH (ref 65–99)
Potassium: 4.1 mmol/L (ref 3.5–5.1)
Sodium: 133 mmol/L — ABNORMAL LOW (ref 135–145)

## 2016-08-30 LAB — TROPONIN I: Troponin I: 0.24 ng/mL (ref <0.03)

## 2016-08-30 LAB — D-DIMER, QUANTITATIVE (NOT AT ARMC): D DIMER QUANT: 0.31 ug{FEU}/mL (ref 0.00–0.50)

## 2016-08-30 LAB — APTT: aPTT: 31 seconds (ref 24–36)

## 2016-08-30 SURGERY — LEFT HEART CATH AND CORONARY ANGIOGRAPHY
Anesthesia: LOCAL

## 2016-08-30 MED ORDER — TICAGRELOR 90 MG PO TABS
ORAL_TABLET | ORAL | Status: AC
Start: 2016-08-30 — End: 2016-08-30
  Filled 2016-08-30: qty 2

## 2016-08-30 MED ORDER — ASPIRIN EC 81 MG PO TBEC
81.0000 mg | DELAYED_RELEASE_TABLET | Freq: Every day | ORAL | Status: DC
  Administered 2016-08-31 – 2016-09-01 (×2): 81 mg via ORAL
  Filled 2016-08-30 (×2): qty 1

## 2016-08-30 MED ORDER — IOPAMIDOL (ISOVUE-370) INJECTION 76%
INTRAVENOUS | Status: AC
  Filled 2016-08-30: qty 100

## 2016-08-30 MED ORDER — ASPIRIN 81 MG PO CHEW: 81.0000 mg | CHEWABLE_TABLET | Freq: Every day | ORAL | Status: DC

## 2016-08-30 MED ORDER — NITROGLYCERIN 0.4 MG SL SUBL: 0.4000 mg | SUBLINGUAL_TABLET | SUBLINGUAL | Status: DC | PRN

## 2016-08-30 MED ORDER — HEPARIN (PORCINE) IN NACL 2-0.9 UNIT/ML-% IJ SOLN
INTRAMUSCULAR | Status: DC | PRN
  Administered 2016-08-30: 1000 mL
  Administered 2016-08-30: 500 mL

## 2016-08-30 MED ORDER — ASPIRIN 81 MG PO CHEW: 324.0000 mg | CHEWABLE_TABLET | ORAL | Status: DC

## 2016-08-30 MED ORDER — PREDNISONE 20 MG PO TABS
40.0000 mg | ORAL_TABLET | Freq: Once | ORAL | Status: AC
  Administered 2016-08-30: 40 mg via ORAL
  Filled 2016-08-30: qty 2

## 2016-08-30 MED ORDER — MORPHINE SULFATE (PF) 2 MG/ML IV SOLN
2.0000 mg | Freq: Once | INTRAVENOUS | Status: AC
  Administered 2016-08-30: 2 mg via INTRAVENOUS
  Filled 2016-08-30: qty 1

## 2016-08-30 MED ORDER — LIDOCAINE HCL (PF) 1 % IJ SOLN
INTRAMUSCULAR | Status: AC
  Filled 2016-08-30: qty 30

## 2016-08-30 MED ORDER — HEPARIN (PORCINE) IN NACL 100-0.45 UNIT/ML-% IJ SOLN
1250.0000 [IU]/h | INTRAMUSCULAR | Status: DC
  Administered 2016-08-30: 1250 [IU]/h via INTRAVENOUS
  Filled 2016-08-30: qty 250

## 2016-08-30 MED ORDER — NITROGLYCERIN 0.4 MG SL SUBL
0.4000 mg | SUBLINGUAL_TABLET | SUBLINGUAL | Status: DC | PRN
  Administered 2016-08-30 (×2): 0.4 mg via SUBLINGUAL
  Filled 2016-08-30: qty 1

## 2016-08-30 MED ORDER — TICAGRELOR 90 MG PO TABS
ORAL_TABLET | ORAL | Status: DC | PRN
  Administered 2016-08-30: 180 mg via ORAL

## 2016-08-30 MED ORDER — FENTANYL CITRATE (PF) 100 MCG/2ML IJ SOLN
INTRAMUSCULAR | Status: AC
  Filled 2016-08-30: qty 2

## 2016-08-30 MED ORDER — MIDAZOLAM HCL 2 MG/2ML IJ SOLN
INTRAMUSCULAR | Status: DC | PRN
  Administered 2016-08-30: 2 mg via INTRAVENOUS

## 2016-08-30 MED ORDER — NITROGLYCERIN 1 MG/10 ML FOR IR/CATH LAB
INTRA_ARTERIAL | Status: DC | PRN
  Administered 2016-08-30: 200 ug via INTRACORONARY

## 2016-08-30 MED ORDER — IOPAMIDOL (ISOVUE-370) INJECTION 76%
INTRAVENOUS | Status: DC | PRN
Start: 2016-08-30 — End: 2016-08-30
  Administered 2016-08-30: 180 mL via INTRA_ARTERIAL

## 2016-08-30 MED ORDER — HEART ATTACK BOUNCING BOOK
Freq: Once | Status: AC
  Administered 2016-08-30: 1
  Filled 2016-08-30: qty 1

## 2016-08-30 MED ORDER — ANGIOPLASTY BOOK
Freq: Once | Status: AC
  Administered 2016-08-30: 21:00:00 1
  Filled 2016-08-30: qty 1

## 2016-08-30 MED ORDER — HEPARIN (PORCINE) IN NACL 2-0.9 UNIT/ML-% IJ SOLN
INTRAMUSCULAR | Status: AC
  Filled 2016-08-30: qty 1000

## 2016-08-30 MED ORDER — IBUPROFEN 200 MG PO TABS: 600.0000 mg | ORAL_TABLET | Freq: Four times a day (QID) | ORAL | Status: DC | PRN

## 2016-08-30 MED ORDER — NITROGLYCERIN IN D5W 200-5 MCG/ML-% IV SOLN
5.0000 ug/min | INTRAVENOUS | Status: DC
  Administered 2016-08-30: 5 ug/min via INTRAVENOUS
  Filled 2016-08-30: qty 250

## 2016-08-30 MED ORDER — FENTANYL CITRATE (PF) 100 MCG/2ML IJ SOLN
INTRAMUSCULAR | Status: DC | PRN
  Administered 2016-08-30: 50 ug via INTRAVENOUS

## 2016-08-30 MED ORDER — HEPARIN SODIUM (PORCINE) 1000 UNIT/ML IJ SOLN
INTRAMUSCULAR | Status: AC
Start: 2016-08-30 — End: 2016-08-30
  Filled 2016-08-30: qty 1

## 2016-08-30 MED ORDER — PREDNISONE 20 MG PO TABS
20.0000 mg | ORAL_TABLET | Freq: Three times a day (TID) | ORAL | Status: DC
  Administered 2016-08-30 – 2016-09-01 (×5): 20 mg via ORAL
  Filled 2016-08-30 (×5): qty 1

## 2016-08-30 MED ORDER — ASPIRIN 81 MG PO CHEW
324.0000 mg | CHEWABLE_TABLET | Freq: Once | ORAL | Status: AC
  Administered 2016-08-30: 324 mg via ORAL
  Filled 2016-08-30: qty 4

## 2016-08-30 MED ORDER — ATORVASTATIN CALCIUM 40 MG PO TABS
80.0000 mg | ORAL_TABLET | Freq: Every day | ORAL | Status: DC
  Administered 2016-08-30 – 2016-08-31 (×2): 80 mg via ORAL
  Filled 2016-08-30 (×5): qty 2

## 2016-08-30 MED ORDER — ONDANSETRON HCL 4 MG/2ML IJ SOLN: 4.0000 mg | Freq: Four times a day (QID) | INTRAMUSCULAR | Status: DC | PRN

## 2016-08-30 MED ORDER — ASPIRIN 300 MG RE SUPP: 300.0000 mg | RECTAL | Status: DC

## 2016-08-30 MED ORDER — METOPROLOL TARTRATE 25 MG PO TABS
25.0000 mg | ORAL_TABLET | Freq: Two times a day (BID) | ORAL | Status: DC
  Administered 2016-08-30 – 2016-09-01 (×5): 25 mg via ORAL
  Filled 2016-08-30 (×5): qty 1

## 2016-08-30 MED ORDER — ACETAMINOPHEN 325 MG PO TABS: 650.0000 mg | ORAL_TABLET | ORAL | Status: DC | PRN

## 2016-08-30 MED ORDER — VERAPAMIL HCL 2.5 MG/ML IV SOLN
INTRAVENOUS | Status: DC | PRN
  Administered 2016-08-30: 17:00:00 via INTRA_ARTERIAL

## 2016-08-30 MED ORDER — ALBUTEROL SULFATE HFA 108 (90 BASE) MCG/ACT IN AERS
2.0000 | INHALATION_SPRAY | Freq: Once | RESPIRATORY_TRACT | Status: AC
  Administered 2016-08-30: 2 via RESPIRATORY_TRACT
  Filled 2016-08-30: qty 6.7

## 2016-08-30 MED ORDER — METOPROLOL TARTRATE 5 MG/5ML IV SOLN
10.0000 mg | Freq: Once | INTRAVENOUS | Status: DC
  Filled 2016-08-30: qty 10

## 2016-08-30 MED ORDER — MIDAZOLAM HCL 2 MG/2ML IJ SOLN
INTRAMUSCULAR | Status: AC
  Filled 2016-08-30: qty 2

## 2016-08-30 MED ORDER — SODIUM CHLORIDE 0.9 % IV SOLN
INTRAVENOUS | Status: DC
  Administered 2016-08-30: 18:00:00 via INTRAVENOUS

## 2016-08-30 MED ORDER — HEPARIN SODIUM (PORCINE) 1000 UNIT/ML IJ SOLN
INTRAMUSCULAR | Status: DC | PRN
  Administered 2016-08-30: 3000 [IU] via INTRAVENOUS
  Administered 2016-08-30 (×2): 5000 [IU] via INTRAVENOUS

## 2016-08-30 MED ORDER — TICAGRELOR 90 MG PO TABS
90.0000 mg | ORAL_TABLET | Freq: Two times a day (BID) | ORAL | Status: DC
  Administered 2016-08-31 – 2016-09-01 (×3): 90 mg via ORAL
  Filled 2016-08-30 (×3): qty 1

## 2016-08-30 MED ORDER — SODIUM CHLORIDE 0.9 % IV SOLN
250.0000 mL | INTRAVENOUS | Status: DC | PRN
Start: 2016-08-30 — End: 2016-09-01

## 2016-08-30 MED ORDER — LEVOFLOXACIN 500 MG PO TABS
500.0000 mg | ORAL_TABLET | Freq: Every day | ORAL | Status: DC
  Administered 2016-08-31 – 2016-09-01 (×2): 500 mg via ORAL
  Filled 2016-08-30 (×2): qty 1

## 2016-08-30 MED ORDER — NITROGLYCERIN 1 MG/10 ML FOR IR/CATH LAB
INTRA_ARTERIAL | Status: AC
  Filled 2016-08-30: qty 10

## 2016-08-30 MED ORDER — SODIUM CHLORIDE 0.9% FLUSH: 3.0000 mL | INTRAVENOUS | Status: DC | PRN

## 2016-08-30 MED ORDER — PROCHLORPERAZINE EDISYLATE 5 MG/ML IJ SOLN
5.0000 mg | Freq: Once | INTRAMUSCULAR | Status: AC
  Administered 2016-08-30: 5 mg via INTRAVENOUS
  Filled 2016-08-30: qty 2

## 2016-08-30 MED ORDER — HEPARIN SODIUM (PORCINE) 1000 UNIT/ML IJ SOLN
INTRAMUSCULAR | Status: AC
  Filled 2016-08-30: qty 1

## 2016-08-30 MED ORDER — HEPARIN BOLUS VIA INFUSION
4000.0000 [IU] | Freq: Once | INTRAVENOUS | Status: AC
  Administered 2016-08-30: 4000 [IU] via INTRAVENOUS

## 2016-08-30 MED ORDER — VERAPAMIL HCL 2.5 MG/ML IV SOLN
INTRAVENOUS | Status: AC
  Filled 2016-08-30: qty 2

## 2016-08-30 MED ORDER — SODIUM CHLORIDE 0.9% FLUSH
3.0000 mL | Freq: Two times a day (BID) | INTRAVENOUS | Status: DC
  Administered 2016-08-30 – 2016-08-31 (×2): 3 mL via INTRAVENOUS

## 2016-08-30 SURGICAL SUPPLY — 18 items
BALLN MINITREK RX 2.0X12 (BALLOONS) ×2
BALLN ~~LOC~~ EMERGE MR 2.75X12 (BALLOONS) ×2
BALLOON MINITREK RX 2.0X12 (BALLOONS) ×1 IMPLANT
BALLOON ~~LOC~~ EMERGE MR 2.75X12 (BALLOONS) ×1 IMPLANT
CATH INFINITI MULTIPACK ST 5F (CATHETERS) ×2 IMPLANT
CATH VISTA GUIDE 6FR XB3 (CATHETERS) ×2 IMPLANT
DEVICE RAD COMP TR BAND LRG (VASCULAR PRODUCTS) ×2 IMPLANT
GLIDESHEATH SLEND SS 6F .021 (SHEATH) ×2 IMPLANT
KIT ENCORE 26 ADVANTAGE (KITS) ×2 IMPLANT
KIT HEART LEFT (KITS) ×2 IMPLANT
PACK CARDIAC CATHETERIZATION (CUSTOM PROCEDURE TRAY) ×2 IMPLANT
STENT PROMUS PREM MR 2.5X16 (Permanent Stent) ×2 IMPLANT
SYR MEDRAD MARK V 150ML (SYRINGE) ×2 IMPLANT
TRANSDUCER W/STOPCOCK (MISCELLANEOUS) ×2 IMPLANT
TUBING CIL FLEX 10 FLL-RA (TUBING) ×2 IMPLANT
WIRE HI TORQ BMW 190CM (WIRE) ×2 IMPLANT
WIRE HI TORQ VERSACORE-J 145CM (WIRE) ×2 IMPLANT
WIRE SAFE-T 1.5MM-J .035X260CM (WIRE) ×2 IMPLANT

## 2016-08-30 NOTE — ED Notes (Signed)
Patient transported to X-ray 

## 2016-08-30 NOTE — H&P (Signed)
CARDIOLOGY HISTORY AND PHYSICAL   Patient ID: Shawn Hughes MRN: [id number]  DOB/AGE: March 02, 1966 50 y.o. Admit date: 08/30/2016  Primary Care Physician: [facility] Primary Cardiologist: New (remotely Dr. Allyson Sabal)  Clinical Summary Shawn Hughes is a 50 y.o.male with no prior history of coronary artery disease who presented to the emergency room after awakening last evening with stabbing chest pain radiating to his back, jaw and left arm. The patient states on 08/29/2016 he was feeling weak and nauseated, thought he caught a "bug" and a respiratory infection. Was seen at an urgent care where he was treated for possible early pneumonia, given steroids, antibiotics, cough medicine.  He went home and went to bed around 7:30 pm and awoke a couple hours later with stabbing chest pain and radiation to his jaw and right arm. The patient states that he could not lay down flat he had to lean up against a wall. He described the pain in a crescendo decrescendo pattern,  waxing and waning but never going away. The pain lasted for several hours unrelenting 10/10 -4/10. He awoke his wife about 3 AM who called EMS. EKG was completed the patient was told there was no abnormalities.  He reported that EMS thought his discomfort was related to his pneumonia. The patient went back to bed, but awoke again this morning with continued unrelenting chest pain sharp stabbing with associated nausea. He presented to the emergency room.  On arrival to the emergency room he was complaining of 10 over 10 sharp chest pain with continued radiation. Blood pressure 171/109, heart rate 114, O2 sat 98%, afebrile. D-dimer was negative at 0.31 white blood cells were elevated at 26.8, he was not found to be anemic, sodium was 133, creatinine 0.81, glucose 278. Troponin 0.24. EKG sinus tachycardia with ST depression noted V2 and V3. Chest x-ray revealed chronic bronchitic changes but no acute pneumonia CHF or other  acute cardiopulmonary abnormality.   He was treated with morphine 2 mg IV, Compazine, albuterol inhaler, chewable aspirin, nitroglycerin 3. Due to elevated troponin and ongoing chest pain cardiology was asked to assist. He was seen and examined by Dr. Diona Browner and it was felt that the patient was best served being transferred to [facility] for cardiac catheterization in the setting of ongoing active chest pain and suspected ACS.  This was explained to the patient and his wife who verbalized understanding he is willing to be transferred to [facility] and proceed with cardiac catheterization. Risks and benefits have been explained to the patient by Dr. Diona Browner. In the interim the patient is started on heparin drip, nitroglycerin drip, provided aspirin, we have started on him on metoprolol 25 mg twice a day, Lipitor 80 mg daily. He did not require oxygen. Also given IV Lopressor in ER.  Allergies  Allergen Reactions  . Hydrocodone Itching    Home Medications No current facility-administered medications on file prior to encounter.    Current Outpatient Prescriptions on File Prior to Encounter  Medication Sig Dispense Refill  . ibuprofen (ADVIL,MOTRIN) 200 MG tablet Take 600-800 mg by mouth every 6 (six) hours as needed for mild pain.    Prednisone 20 mg 3 times a day (started just recently) Levofloxacin 500 mg daily (started recently)  Scheduled Medications . aspirin  81 mg Oral Daily  . atorvastatin  80 mg Oral q1800  . metoprolol  10 mg Intravenous Once  . metoprolol tartrate  25 mg Oral BID    Infusions .  heparin 1,250 Units/hr (08/30/16 1237)  . nitroGLYCERIN 5 mcg/min (08/30/16 1244)    PRN Medications nitroGLYCERIN  Past Medical History:  Diagnosis Date  . GERD (gastroesophageal reflux disease)   . History of nephrolithiasis     Past Surgical History:  Procedure Laterality Date  . ESOPHAGOGASTRODUODENOSCOPY  01/10/2012   GMW:NUUVOZDGU in the distal esophagus2O  TO GERD/Mild gastritis  . LACERATION REPAIR     Right arm    Family History  Problem Relation Age of Onset  . CAD Mother 29  . CAD Father 58  . Hypertension Brother   . Colon cancer Neg Hx   . Colon polyps Neg Hx   . Esophageal cancer Neg Hx   . Pancreatic cancer Neg Hx   . Stomach cancer Neg Hx     Social History Shawn Hughes reports that he has been smoking Cigarettes.  He has a 7.50 pack-year smoking history. He has never used smokeless tobacco. Shawn Hughes reports that he drinks about 1.8 oz of alcohol per week .  Review of Systems Otherwise reviewed and negative except as outlined. Wife indicates that Shawn Hughes has complained of intermittent exertional fatigue in the last month.  Physical Examination Temp:  [97.7 F (36.5 C)] 97.7 F (36.5 C) (10/06 0949) Pulse Rate:  [91-119] 96 (10/06 1230) Resp:  [16-28] 16 (10/06 1230) BP: (123-171)/(81-113) 131/86 (10/06 1230) SpO2:  [94 %-98 %] 95 % (10/06 1230) Weight:  [205 lb (93 kg)] 205 lb (93 kg) (10/06 0943) No intake or output data in the 24 hours ending 08/30/16 1255  Gen: Ill-appearing, continuing to complain of active chest pain.  HEENT: Conjunctiva and lids normal, oropharynx clear with moist mucosa. Neck: Supple, no elevated JVP or carotid bruits, no thyromegaly. Lungs: Clear to auscultation, nonlabored breathing at rest. Cardiac: Regular rate and rhythm, tachycardic, no S3 or significant systolic murmur, no pericardial rub. Abdomen: Soft, nontender, bowel sounds present, no guarding or rebound. Extremities: No pitting edema, distal pulses 2+. Skin: Warm and dry. Musculoskeletal: No kyphosis. Neuropsychiatric: Alert and oriented x3, affect grossly appropriate.  Lab Results  Basic Metabolic Panel:  Recent Labs Lab 08/30/16 1011  NA 133*  K 4.1  CL 102  CO2 22  GLUCOSE 278*  BUN 13  CREATININE 0.81  CALCIUM 9.4   CBC:  Recent Labs Lab 08/30/16 1011  WBC 26.8*  NEUTROABS 25.2*  HGB 16.6  HCT  47.3  MCV 93.1  PLT 288    Cardiac Enzymes:  Recent Labs Lab 08/30/16 1011  TROPONINI 0.24*    Radiology Dg Chest 2 View  Result Date: 08/30/2016 CLINICAL DATA:  Stabbing left-sided chest pain possible early pneumonia, current smoker. EXAM: CHEST  2 VIEW COMPARISON:  No recent studies in [facility] FINDINGS: The lungs are adequately inflated. There is no pneumothorax, pleural effusion, or alveolar pneumonia. The interstitial markings are coarse bilaterally. The heart and pulmonary vascularity are normal. The mediastinum is normal in width. The bony thorax is unremarkable. IMPRESSION: Mild chronic bronchitic changes. No acute pneumonia, CHF, nor other acute cardiopulmonary abnormality. Electronically Signed   By: David  [place] M.D.   On: 08/30/2016 10:36    ECG. Sinus tachycardia with nonspecific ST depression noted V2, V3.   Impression and Recommendations  1. Acute coronary syndrome: Active chest pain with elevated troponin and nonspecific changes in his EKG. The patient's been seen and examined by Dr. Diona Browner in the ER and is felt the patient is best served by transfer to [facility] for cardiac catheterization  today.  He is being placed on IV nitroglycerin, heparin, beta blocker, and has received aspirin. Also emperic statin therapy. The patient is agreeable to transfer to [facility] and undergo catheterization. The patient is being sent by [facility], accepted by Dr. Tresa EndoKelly, with planned catheterization by Dr.Varanasi. He will be placed in ICU.  2. Ongoing tobacco abuse: Patient is a one pack-a-day smoker and has been smoking since the age of 50. Smoking cessation is strongly recommended  3. Alcohol use: He drinks 2, 12 ounce beers a night and sometimes more on the weekends.  Signed: Bettey MareKathryn M. Lawrence NP [facility]  08/30/2016, 12:55 PM Co-Sign MD  Attending note:  Patient seen and examined. Reviewed available records and discussed the case with Ms. Lawrence NP. Shawn Hughes presents  with recent onset sharp chest discomfort radiating to the neck, left arm, and shoulders. This began in the early morning hours, proceeded by orthopnea, and symptoms that he thought were potentially a developing upper respiratory tract infection. He had been seen at an urgent care recently and started on prednisone and Levaquin. Symptoms that took him to that visit were mainly just a feeling of fatigue and malaise. His wife indicates that he has had episodic fatigue over the last month. He does not have any personal history of CAD, reports undergoing a cardiac catheterization with Dr. Allyson SabalBerry about 10 years ago. He does have a family history of premature disease in both parents.  On examination he appears uncomfortable, reports continued chest discomfort. He presented both tachycardic and hypertensive. Lungs exhibit diminished but clear breath sounds without radials, cardiac exam with S4, abdomen nontender, extremities show no edema. Lab work reveals creatinine 0.81, potassium 4.1, troponin I 0.24, WBC 26, hemoglobin 16.6, platelets 288, d-dimer 0.31. ECG shows ST segment depression in the anteroseptal leads. Chest x-ray shows chronic appearing bronchitic changes with no acute infiltrates or pulmonary edema.  Acute coronary syndrome (NSTEMI), initial troponin I 0.24, anteroseptal ST segment depression by ECG. Symptom onset was within the last 12-24 hours. He does not having any acute infiltrates or edema by chest x-ray. Recently placed on antibiotics and steroids (likely explains leukocytosis). No current fevers. Being initiated on heparin and IV nitroglycerin as well as given IV Lopressor by ER staff. Also received aspirin and will place on empiric statin therapy. After discussion plan is to have him transferred to [facility] in anticipation of a diagnostic cardiac catheterization today. ICU bed arranged. He is in agreement.  Jonelle SidleSamuel G. Jamelia Varano, M.D., F.A.C.C.

## 2016-08-30 NOTE — Care Management Note (Signed)
Case Management Note  Patient Details  Name: Gar PontoScottie D Koning MRN: [id number] Date of Birth: 12-Sep-1966  Subjective/Objective:   S/p intervention, NSTEMI, will be in twilight study per RN.  NCM will cont to follow for dc needs.                 Action/Plan:   Expected Discharge Date:                  Expected Discharge Plan:  Home/Self Care  In-House Referral:     Discharge planning Services  CM Consult  Post Acute Care Choice:    Choice offered to:     DME Arranged:    DME Agency:     HH Arranged:    HH Agency:     Status of Service:  Completed, signed off  If discussed at [employer] Length of Stay Meetings, dates discussed:    Additional Comments:  Leone Havenaylor, Mercedez Boule Clinton, RN 08/30/2016, 7:11 PM

## 2016-08-30 NOTE — Progress Notes (Signed)
ANTICOAGULATION CONSULT NOTE - Initial Consult  Pharmacy Consult for HEPARIN Indication: ACS  Allergies  Allergen Reactions  . Hydrocodone Itching   Patient Measurements: Height: 5\' 9"  (175.3 cm) Weight: 205 lb (93 kg) IBW/kg (Calculated) : 70.7 HEPARIN DW (KG): 89.8  Vital Signs: Temp: 97.7 F (36.5 C) (10/06 0949) Temp Source: Oral (10/06 0949) BP: 154/107 (10/06 1130) Pulse Rate: 119 (10/06 1130)  Labs:  Recent Labs  08/30/16 1011  HGB 16.6  HCT 47.3  PLT 288  CREATININE 0.81  TROPONINI 0.24*   Estimated Creatinine Clearance: 125.6 mL/min (by C-G formula based on SCr of 0.81 mg/dL).  Medical History: Past Medical History:  Diagnosis Date  . GERD (gastroesophageal reflux disease)   . History of nephrolithiasis    Medications:   (Not in a hospital admission)  Assessment: 50yo male c/o chest pain that radiates to left arm and neck x 1 week.  Asked to initiate Heparin for ACS.  Per PTA med list, pt not on anticoagulants.    Goal of Therapy:  Heparin level 0.3-0.7 units/ml Monitor platelets by anticoagulation protocol: Yes   Plan:   Heparin 4000 units IV now x 1  Heparin infusion at 1250 units/hr  Heparin level in 6-8 hrs then daily  CBC daily while on Heparin  Margo AyeHall, Bryndon Cumbie A 08/30/2016,12:06 PM

## 2016-08-30 NOTE — ED Notes (Signed)
EMS arrived to transport patient to cath lab at [facility]. Updated family and patient on disposition.

## 2016-08-30 NOTE — Interval H&P Note (Signed)
History and Physical Interval Note:  08/30/2016 4:25 PM  Shawn Hughes  has presented today for cardiac cath with the diagnosis of NSTEMI. The various methods of treatment have been discussed with the patient and family. After consideration of risks, benefits and other options for treatment, the patient has consented to  Procedure(s): Left Heart Cath and Coronary Angiography (N/A) as a surgical intervention .  The patient's history has been reviewed, patient examined, no change in status, stable for surgery.  I have reviewed the patient's chart and labs.  Questions were answered to the patient's satisfaction.    Cath Lab Visit (complete for each Cath Lab visit)  Clinical Evaluation Leading to the Procedure:   ACS: Yes.    Non-ACS:    Anginal Classification: CCS III  Anti-ischemic medical therapy: No Therapy  Non-Invasive Test Results: No non-invasive testing performed  Prior CABG: No previous CABG         Verne Carrowhristopher Nikeia Henkes

## 2016-08-30 NOTE — ED Triage Notes (Signed)
Pt reports left sided chest pain that radiates into left arm and left side of neck x 1 week.  Reports went to [facility] yesterday and was told he has pneumonia in r lung and was given steroids and antibiotics.  Today pain is worse.  Reports SOB and nausea.  PT also reports pain is worse with certain movements.

## 2016-08-30 NOTE — ED Provider Notes (Signed)
[facility]-EMERGENCY DEPT Provider Note   CSN: [id number] Arrival date & time: 08/30/16  [id number]     History   Chief Complaint Chief Complaint  Patient presents with  . Chest Pain    HPI Shawn Hughes is a 50 y.o. male.  Patient is a 50 year old male who presents to the emergency department with a complaint of left chest pain.  The patient and his significant other state that he's been having some difficulty with chest discomfort off and on for nearly a week. The patient states that he did not seek medical assistance at that time because there was a virus that was going around his place of work. 2 days ago the problem got progressively worse, and [facility]. He had an x-ray done there and was told he possibly had some pneumonia on. He was treated with antibiotics and steroids. The patient states that the night he had a lot of coughing, and production of phlegm. Early this morning he developed pain in the left chest going into his neck and shoulder. He denies any fever. Denies any hemoptysis. There's been no recent injury or operation, or procedure to the chest, neck, or shoulder. The patient denies a history of hypertension or known heart disease. He is a smoker. He uses alcohol on a weekly basis, he denies use of any recreational or street drugs. He denies any environment changes, and he does not work around chemicals or smoke, he does work around a lot of dust. It is of note that his mom and dad both had heart disease, his mom passed away at age 48, and his dad passed away at age 48 with heart related problems. The patient is not been out of the country recently.      Past Medical History:  Diagnosis Date  . GERD (gastroesophageal reflux disease)   . Kidney stone     Patient Active Problem List   Diagnosis Date Noted  . Loose stools 09/02/2013  . GERD (gastroesophageal reflux disease) 09/02/2013  . Chest pain 01/09/2012  . WEIGHT LOSS 08/03/2010   . NAUSEA WITH VOMITING 08/03/2010  . CHANGE IN BOWELS 08/03/2010  . EPIGASTRIC PAIN 08/03/2010    Past Surgical History:  Procedure Laterality Date  . ESOPHAGOGASTRODUODENOSCOPY  01/10/2012   JXB:JYNWGNFAOSLF:Stricture in the distal esophagus2O TO GERD/Mild gastritis  . LACERATION REPAIR     right arm       Home Medications    Prior to Admission medications   Medication Sig Start Date End Date Taking? Authorizing Provider  dicyclomine (BENTYL) 10 MG capsule Take 1 capsule (10 mg total) by mouth 4 (four) times daily -  before meals and at bedtime. Patient not taking: Reported on 02/04/2015 09/02/13   Gelene MinkAnna W Boone, NP  ibuprofen (ADVIL,MOTRIN) 200 MG tablet Take 600-800 mg by mouth every 6 (six) hours as needed for mild pain.    Historical Provider, MD  pantoprazole (PROTONIX) 40 MG tablet Take 1 tablet (40 mg total) by mouth daily. 30 minutes before breakfast Patient not taking: Reported on 02/04/2015 09/02/13   Gelene MinkAnna W Boone, NP    Family History Family History  Problem Relation Age of Onset  . Colon cancer Neg Hx   . Colon polyps Neg Hx   . Esophageal cancer Neg Hx   . Pancreatic cancer Neg Hx   . Stomach cancer Neg Hx     Social History Social History  Substance Use Topics  . Smoking status:  Current Every Day Smoker    Packs/day: 0.50    Types: Cigarettes  . Smokeless tobacco: Never Used  . Alcohol use Yes     Comment: socially     Allergies   Hydrocodone   Review of Systems Review of Systems  HENT: Positive for congestion.   Respiratory: Positive for cough. Negative for wheezing.   Cardiovascular: Positive for chest pain. Negative for palpitations and leg swelling.  Musculoskeletal: Positive for myalgias.  All other systems reviewed and are negative.    Physical Exam Updated Vital Signs BP (!) 162/112   Pulse 102   Temp 97.7 F (36.5 C) (Oral)   Resp 17   Ht 5\' 9"  (1.753 m)   Wt 93 kg   SpO2 98%   BMI 30.27 kg/m   Physical Exam  Constitutional: He  appears well-developed and well-nourished. No distress.  Appears uncomfortable.  HENT:  Head: Normocephalic and atraumatic.  Right Ear: External ear normal.  Left Ear: External ear normal.  Eyes: Conjunctivae are normal. Right eye exhibits no discharge. Left eye exhibits no discharge. No scleral icterus.  Neck: Neck supple. No tracheal deviation present.  Cardiovascular: Normal rate, regular rhythm and intact distal pulses.   Pulmonary/Chest: Effort normal and breath sounds normal. No stridor. Tachypnea noted. No respiratory distress. He has no wheezes. He has no rales.  Respiratory rate 20.  Abdominal: Soft. Bowel sounds are normal. He exhibits no distension. There is no tenderness. There is no rebound and no guarding.  Musculoskeletal: He exhibits no edema or tenderness.  NO edema. Neg Homan's Sign.  Neurological: He is alert. He has normal strength. No cranial nerve deficit (no facial droop, extraocular movements intact, no slurred speech) or sensory deficit. He exhibits normal muscle tone. He displays no seizure activity. Coordination normal.  Skin: Skin is warm and dry. No rash noted.  Psychiatric: His mood appears anxious.  Nursing note and vitals reviewed.    ED Treatments / Results  Labs (all labs ordered are listed, but only abnormal results are displayed) Labs Reviewed  CBC WITH DIFFERENTIAL/PLATELET  BASIC METABOLIC PANEL  TROPONIN I  D-DIMER, QUANTITATIVE (NOT AT [facility])    EKG  EKG Interpretation None       Radiology No results found.  Procedures Procedures (including critical care time)  Medications Ordered in ED Medications  morphine 2 MG/ML injection 2 mg (not administered)  prochlorperazine (COMPAZINE) injection 5 mg (not administered)     Initial Impression / Assessment and Plan / ED Course  I have reviewed the triage vital signs and the nursing notes.  Pertinent labs & imaging results that were available during my care of the patient were  reviewed by me and considered in my medical decision making (see chart for details).  Clinical Course    **I have reviewed nursing notes, vital signs, and all appropriate lab and imaging results for this patient.*  Final Clinical Impressions(s) / ED Diagnoses  The heart rate is elevated at 102, blood pressure is elevated at 162/112. The chest x-ray shows chronic bronchitis changes, but no pneumonia, congestive heart failure, or other acute cardiopulmonary abnormality.   Patient reports only minimal improvement after IV morphine, continues to have pain in the chest. Pulse oximetry is 98% on room air. Blood pressure continues to be elevated. The basic metabolic panel is within normal limits, with the exception of the glucose being 278. The troponin is elevated at 0.24. The d-dimer is within normal limits at 0.31.  The electrocardiogram shows some  abnormality in with questionable ST depression in V2 and V3 on.  Case discussed with and reviewed by Dr. Juleen China. Case discussed with Dr. Diona Browner, cardiology. Dr. Diona Browner will see the patient for admission. I discussed all findings and admission with the patient in terms which he understands, as well as his significant other. Patient is in agreement to be admitted on. IV metoprolol and IV nitroglycerin have been ordered. The patient is already been given aspirin here in the department.    Final diagnoses:  None    New Prescriptions New Prescriptions   No medications on file     Shawn Hughes, Cordelia Poche 09/02/16 Ernestina Columbia    Raeford Razor, MD 09/04/16 1157

## 2016-08-30 NOTE — ED Notes (Signed)
CRITICAL VALUE ALERT  Critical value received: Troponin 0.24  Date of notification: 08/30/2016  Time of notification:  1103  Critical value read back:Yes.    Nurse who received alert:  [facility] RN  MD notified (1st page):  H. Beverely PaceBryant PA  Time of first page:  1103  MD notified (2nd page):  Time of second page:  Responding MD:  Loney LaurenceH. Bryant PA  Time MD responded:  [phone]

## 2016-08-31 ENCOUNTER — Inpatient Hospital Stay (HOSPITAL_COMMUNITY): Payer: BLUE CROSS/BLUE SHIELD

## 2016-08-31 DIAGNOSIS — I249 Acute ischemic heart disease, unspecified: Secondary | ICD-10-CM

## 2016-08-31 LAB — URINALYSIS, ROUTINE W REFLEX MICROSCOPIC
Bilirubin Urine: NEGATIVE
Glucose, UA: >1000 mg/dL — AB
Hgb urine dipstick: NEGATIVE
KETONES UR: NEGATIVE mg/dL
LEUKOCYTES UA: NEGATIVE
NITRITE: NEGATIVE
PROTEIN: NEGATIVE mg/dL
Specific Gravity, Urine: 1.01 (ref 1.005–1.030)
pH: 6 (ref 5.0–8.0)

## 2016-08-31 LAB — CBC WITH DIFFERENTIAL/PLATELET
BASOS PCT: 0 %
Basophils Absolute: 0 10*3/uL (ref 0.0–0.1)
EOS PCT: 0 %
Eosinophils Absolute: 0 10*3/uL (ref 0.0–0.7)
HCT: 45.4 % (ref 39.0–52.0)
Hemoglobin: 15 g/dL (ref 13.0–17.0)
LYMPHS ABS: 1.3 10*3/uL (ref 0.7–4.0)
Lymphocytes Relative: 5 %
MCH: 31.9 pg (ref 26.0–34.0)
MCHC: 33 g/dL (ref 30.0–36.0)
MCV: 96.6 fL (ref 78.0–100.0)
MONO ABS: 1 10*3/uL (ref 0.1–1.0)
Monocytes Relative: 4 %
NEUTROS ABS: 23.1 10*3/uL — AB (ref 1.7–7.7)
NEUTROS PCT: 91 %
PLATELETS: 294 10*3/uL (ref 150–400)
RBC: 4.7 MIL/uL (ref 4.22–5.81)
RDW: 13.6 % (ref 11.5–15.5)
WBC: 25.4 10*3/uL — ABNORMAL HIGH (ref 4.0–10.5)

## 2016-08-31 LAB — URINE MICROSCOPIC-ADD ON
Bacteria, UA: NONE SEEN
RBC / HPF: NONE SEEN RBC/hpf (ref 0–5)
SQUAMOUS EPITHELIAL / LPF: NONE SEEN
WBC UA: NONE SEEN WBC/hpf (ref 0–5)

## 2016-08-31 LAB — BASIC METABOLIC PANEL
ANION GAP: 7 (ref 5–15)
BUN: 13 mg/dL (ref 6–20)
CHLORIDE: 106 mmol/L (ref 101–111)
CO2: 23 mmol/L (ref 22–32)
Calcium: 9.2 mg/dL (ref 8.9–10.3)
Creatinine, Ser: 0.89 mg/dL (ref 0.61–1.24)
GFR calc non Af Amer: >60 mL/min (ref >60)
Glucose, Bld: 261 mg/dL — ABNORMAL HIGH (ref 65–99)
POTASSIUM: 4.5 mmol/L (ref 3.5–5.1)
SODIUM: 136 mmol/L (ref 135–145)

## 2016-08-31 LAB — ECHOCARDIOGRAM COMPLETE
CHL CUP MV DEC (S): 158
E decel time: 158 msec
E/e' ratio: 6.95
FS: 42 % (ref 28–44)
Height: 69 in
IV/PV OW: 0.87
LA ID, A-P, ES: 37 mm
LA vol index: 18.6 mL/m2
LA vol: 38.7 mL
LADIAMINDEX: 1.78 cm/m2
LAVOLA4C: 30.2 mL
LDCA: 3.8 cm2
LEFT ATRIUM END SYS DIAM: 37 mm
LV E/e' medial: 6.95
LV E/e'average: 6.95
LV PW d: 10.5 mm — AB (ref 0.6–1.1)
LV TDI E'LATERAL: 12.9
LVELAT: 12.9 cm/s
LVOTD: 22 mm
MV pk A vel: 82.5 m/s
MV pk E vel: 89.7 m/s
MVPG: 3 mmHg
RV LATERAL S' VELOCITY: 17.5 cm/s
RV TAPSE: 22.6 mm
TDI e' medial: 8.81
WEIGHTICAEL: 3245.17 [oz_av]

## 2016-08-31 LAB — CBC
HEMATOCRIT: 45.8 % (ref 39.0–52.0)
Hemoglobin: 15.4 g/dL (ref 13.0–17.0)
MCH: 32.2 pg (ref 26.0–34.0)
MCHC: 33.6 g/dL (ref 30.0–36.0)
MCV: 95.8 fL (ref 78.0–100.0)
PLATELETS: 319 10*3/uL (ref 150–400)
RBC: 4.78 MIL/uL (ref 4.22–5.81)
RDW: 13.5 % (ref 11.5–15.5)
WBC: 30.1 10*3/uL — AB (ref 4.0–10.5)

## 2016-08-31 LAB — LIPID PANEL
CHOLESTEROL: 215 mg/dL — AB (ref 0–200)
HDL: 30 mg/dL — AB (ref >40)
LDL CALC: 165 mg/dL — AB (ref 0–99)
TRIGLYCERIDES: 99 mg/dL (ref <150)
Total CHOL/HDL Ratio: 7.2 RATIO
VLDL: 20 mg/dL (ref 0–40)

## 2016-08-31 LAB — SEDIMENTATION RATE: SED RATE: 1 mm/h (ref 0–16)

## 2016-08-31 MED ORDER — SODIUM CHLORIDE 0.9% FLUSH
3.0000 mL | Freq: Two times a day (BID) | INTRAVENOUS | Status: DC
  Administered 2016-08-31: 3 mL via INTRAVENOUS

## 2016-08-31 MED ORDER — SODIUM CHLORIDE 0.9% FLUSH: 3.0000 mL | INTRAVENOUS | Status: DC | PRN

## 2016-08-31 MED ORDER — THE SENSUOUS HEART BOOK
Freq: Once | Status: AC
  Administered 2016-08-31: 01:00:00 1
  Filled 2016-08-31: qty 1

## 2016-08-31 MED ORDER — SODIUM CHLORIDE 0.9 % IV SOLN: 250.0000 mL | INTRAVENOUS | Status: DC | PRN

## 2016-08-31 MED ORDER — ACTIVE PARTNERSHIP FOR HEALTH OF YOUR HEART BOOK
Freq: Once | Status: AC
  Administered 2016-08-31: 1
  Filled 2016-08-31: qty 1

## 2016-08-31 MED ORDER — ALPRAZOLAM 0.5 MG PO TABS
0.5000 mg | ORAL_TABLET | Freq: Two times a day (BID) | ORAL | Status: DC | PRN
  Administered 2016-08-31 (×2): 0.5 mg via ORAL
  Filled 2016-08-31 (×2): qty 1

## 2016-08-31 MED ORDER — SODIUM CHLORIDE 0.9 % WEIGHT BASED INFUSION: 1.0000 mL/kg/h | INTRAVENOUS | Status: DC

## 2016-08-31 MED ORDER — ASPIRIN 81 MG PO CHEW: 81.0000 mg | CHEWABLE_TABLET | ORAL | Status: DC

## 2016-08-31 NOTE — Progress Notes (Signed)
Subjective: Feeling a lot better than yesterday  With no CP  Still with cough  Objective: Vitals:   08/30/16 2330 08/30/16 2345 08/31/16 0000 08/31/16 0400  BP: 121/76 123/76 123/81 129/76  Pulse: 95 (!) 103 81 80  Resp: 15 17 13 13   Temp:    98.1 F (36.7 C)  TempSrc:    Oral  SpO2: 96% 97% 96% 96%  Weight:    202 lb 13.2 oz (92 kg)  Height:       Weight change:   Intake/Output Summary (Last 24 hours) at 08/31/16 0625 Last data filed at 08/31/16 0400  Gross per 24 hour  Intake              810 ml  Output             2350 ml  Net            -1540 ml    General: Alert, awake, oriented x3, in no acute distress Neck:  JVP is normal Heart: Regular rate and rhythm, without murmurs, rubs, gallops.  Lungs: Clear to auscultation.  No rales or wheezes.Mild rhonchi   Exemities:  No edema.   Neuro: Grossly intact, nonfocal.  Tele:  SR / ST   Lab Results: Results for orders placed or performed during the hospital encounter of 08/30/16 (from the past 24 hour(s))  CBC with Differential     Status: Abnormal   Collection Time: 08/30/16 10:11 AM  Result Value Ref Range   WBC 26.8 (H) 4.0 - 10.5 K/uL   RBC 5.08 4.22 - 5.81 MIL/uL   Hemoglobin 16.6 13.0 - 17.0 g/dL   HCT 16.1 09.6 - 04.5 %   MCV 93.1 78.0 - 100.0 fL   MCH 32.7 26.0 - 34.0 pg   MCHC 35.1 30.0 - 36.0 g/dL   RDW 40.9 81.1 - 91.4 %   Platelets 288 150 - 400 K/uL   Neutrophils Relative % 94 %   Lymphocytes Relative 4 %   Monocytes Relative 2 %   Eosinophils Relative 0 %   Basophils Relative 0 %   Neutro Abs 25.2 (H) 1.7 - 7.7 K/uL   Lymphs Abs 1.1 0.7 - 4.0 K/uL   Monocytes Absolute 0.5 0.1 - 1.0 K/uL   Eosinophils Absolute 0.0 0.0 - 0.7 K/uL   Basophils Absolute 0.0 0.0 - 0.1 K/uL  Basic metabolic panel     Status: Abnormal   Collection Time: 08/30/16 10:11 AM  Result Value Ref Range   Sodium 133 (L) 135 - 145 mmol/L   Potassium 4.1 3.5 - 5.1 mmol/L   Chloride 102 101 - 111 mmol/L   CO2 22 22 - 32 mmol/L     Glucose, Bld 278 (H) 65 - 99 mg/dL   BUN 13 6 - 20 mg/dL   Creatinine, Ser 7.82 0.61 - 1.24 mg/dL   Calcium 9.4 8.9 - 95.6 mg/dL   GFR calc non Af Amer >60 >60 mL/min   GFR calc Af Amer >60 >60 mL/min   Anion gap 9 5 - 15  Troponin I     Status: Abnormal   Collection Time: 08/30/16 10:11 AM  Result Value Ref Range   Troponin I 0.24 (HH) <0.03 ng/mL  APTT     Status: None   Collection Time: 08/30/16 10:11 AM  Result Value Ref Range   aPTT 31 24 - 36 seconds  D-dimer, quantitative (not at [facility])     Status: None   Collection Time: 08/30/16  10:29 AM  Result Value Ref Range   D-Dimer, Quant 0.31 0.00 - 0.50 ug/mL-FEU  POCT Activated clotting time     Status: None   Collection Time: 08/30/16  5:08 PM  Result Value Ref Range   Activated Clotting Time 252 seconds  CBC     Status: Abnormal   Collection Time: 08/31/16  4:13 AM  Result Value Ref Range   WBC 30.1 (H) 4.0 - 10.5 K/uL   RBC 4.78 4.22 - 5.81 MIL/uL   Hemoglobin 15.4 13.0 - 17.0 g/dL   HCT 16.145.8 09.639.0 - 04.552.0 %   MCV 95.8 78.0 - 100.0 fL   MCH 32.2 26.0 - 34.0 pg   MCHC 33.6 30.0 - 36.0 g/dL   RDW 40.913.5 81.111.5 - 91.415.5 %   Platelets 319 150 - 400 K/uL  Basic metabolic panel     Status: Abnormal   Collection Time: 08/31/16  4:13 AM  Result Value Ref Range   Sodium 136 135 - 145 mmol/L   Potassium 4.5 3.5 - 5.1 mmol/L   Chloride 106 101 - 111 mmol/L   CO2 23 22 - 32 mmol/L   Glucose, Bld 261 (H) 65 - 99 mg/dL   BUN 13 6 - 20 mg/dL   Creatinine, Ser 7.820.89 0.61 - 1.24 mg/dL   Calcium 9.2 8.9 - 95.610.3 mg/dL   GFR calc non Af Amer >60 >60 mL/min   GFR calc Af Amer >60 >60 mL/min   Anion gap 7 5 - 15  Lipid panel     Status: Abnormal   Collection Time: 08/31/16  4:13 AM  Result Value Ref Range   Cholesterol 215 (H) 0 - 200 mg/dL   Triglycerides 99 <213<150 mg/dL   HDL 30 (L) >08>40 mg/dL   Total CHOL/HDL Ratio 7.2 RATIO   VLDL 20 0 - 40 mg/dL   LDL Cholesterol 657165 (H) 0 - 99 mg/dL    Studies/Results: Dg Chest 2 View  Result  Date: 08/30/2016 CLINICAL DATA:  Stabbing left-sided chest pain possible early pneumonia, current smoker. EXAM: CHEST  2 VIEW COMPARISON:  [facility] FINDINGS: The lungs are adequately inflated. There is no pneumothorax, pleural effusion, or alveolar pneumonia. The interstitial markings are coarse bilaterally. The heart and pulmonary vascularity are normal. The mediastinum is normal in width. The bony thorax is unremarkable. IMPRESSION: Mild chronic bronchitic changes. No acute pneumonia, CHF, nor other acute cardiopulmonary abnormality. Electronically Signed   By: David  SwazilandJordan M.D.   On: 08/30/2016 10:36    Medications: REviewed     @PROBHOSP @  1  NSTEMI  Cath yesterday showed 40% RCA 10% LCx  20% LAD 99% ramus  Pt underwent PTCA/DES to ramus  LVEF normal with hypokinesis of the anterorlateral wall  REcomm DAPT and Brilinta x 1 year      2  Tob  Counselled on quitting  He just stopped 2 days ago    3  EtOH  Follow   4  HL   LDL ws 165  HDL 30  On lipitor 80 now    5  INcreased WBC  WBC was 30 today  Was 29 yesterday  Predomin neutrophils  CXR without acute changes   Would repeat CXR  ON levaquin  Check UA    LOS: 1 day   Dietrich PatesPaula Klynn Linnemann 08/31/2016, 6:25 AM

## 2016-08-31 NOTE — Progress Notes (Signed)
  Patient's WBC up to 30.1 this AM. Had been [facility] prior to transfer.   UA negative and CXR with mild right base atelectasis but otherwise clear. Echo is pending.   Will recheck WBC this afternoon. Will make call APP aware so we can follow-up on results. Patient updated on plan and made aware discharge will likely be postponed until tomorrow.   Signed, Ellsworth LennoxBrittany M Yaminah Clayborn, PA-C 08/31/2016, 11:50 AM Pager: [phone]

## 2016-08-31 NOTE — Research (Signed)
TWILIGHT Informed Consent   Subject Name: Shawn Hughes  Subject met inclusion and exclusion criteria.  The informed consent form, study requirements and expectations were reviewed with the subject and questions and concerns were addressed prior to the signing of the consent form.  The subject verbalized understanding of the trail requirements.  The subject agreed to participate in the TWILIGHT trial and signed the informed consent.  The informed consent was obtained prior to performance of any protocol-specific procedures for the subject.  A copy of the signed informed consent was given to the subject and a copy was placed in the subject's medical record.  [name] 08/31/2016, [phone]

## 2016-08-31 NOTE — Progress Notes (Signed)
Notified Randall AnBrittany Strader PA that chest xray completed, UA sent, echo completed and waiting for results at this time.

## 2016-08-31 NOTE — Progress Notes (Signed)
  Echocardiogram 2D Echocardiogram has been performed.  Delcie RochENNINGTON, Kemuel Buchmann 08/31/2016, 9:14 AM

## 2016-08-31 NOTE — Progress Notes (Signed)
CARDIAC REHAB PHASE I   PRE:  Rate/Rhythm: 94 NSR  BP:  Sitting: 125/67      SaO2: 94 RA  MODE:  Ambulation: 500 ft   POST:  Rate/Rhythm: 122 ST  BP:  Sitting: 131/90     SaO2: 97 RA  [id number] Discharge education completed with wife and patient prior to ambulation. Teach back noted. Patient expressed interest in nicotine patches. Primary RN notified. Patient also expressed interest in cardiac re[facility] phase 2 at [facility] but concerned that he may not be able to complete the program due to full time employment. Post education patient ambulated 500 feet independently. Initial complaint of "light headedness" but that resolved after a few seconds. Steady gait noted. Unsure if patient will be discharged today or tomorrow. Echo pending. With patients consent referral placed for phase 2 cardiac rehab at [facility]. Patient appreciative of all education.  Milessa Hogan English PayneRN, BSN 08/31/2016 9:28 AM

## 2016-09-01 ENCOUNTER — Encounter (HOSPITAL_COMMUNITY): Payer: Self-pay | Admitting: Student

## 2016-09-01 DIAGNOSIS — Z720 Tobacco use: Secondary | ICD-10-CM

## 2016-09-01 LAB — CBC WITH DIFFERENTIAL/PLATELET
BASOS ABS: 0 10*3/uL (ref 0.0–0.1)
BASOS PCT: 0 %
Eosinophils Absolute: 0 10*3/uL (ref 0.0–0.7)
Eosinophils Relative: 0 %
HEMATOCRIT: 46.5 % (ref 39.0–52.0)
HEMOGLOBIN: 15.6 g/dL (ref 13.0–17.0)
Lymphocytes Relative: 5 %
Lymphs Abs: 0.9 10*3/uL (ref 0.7–4.0)
MCH: 32.2 pg (ref 26.0–34.0)
MCHC: 33.5 g/dL (ref 30.0–36.0)
MCV: 96.1 fL (ref 78.0–100.0)
Monocytes Absolute: 0.6 10*3/uL (ref 0.1–1.0)
Monocytes Relative: 3 %
NEUTROS ABS: 18 10*3/uL — AB (ref 1.7–7.7)
NEUTROS PCT: 92 %
Platelets: 295 10*3/uL (ref 150–400)
RBC: 4.84 MIL/uL (ref 4.22–5.81)
RDW: 13.6 % (ref 11.5–15.5)
WBC: 19.5 10*3/uL — AB (ref 4.0–10.5)

## 2016-09-01 MED ORDER — ATORVASTATIN CALCIUM 80 MG PO TABS: 80.0000 mg | ORAL_TABLET | Freq: Every day | ORAL | 6 refills | Status: DC

## 2016-09-01 MED ORDER — METOPROLOL TARTRATE 25 MG PO TABS: 25.0000 mg | ORAL_TABLET | Freq: Two times a day (BID) | ORAL | 6 refills | Status: DC

## 2016-09-01 MED ORDER — TICAGRELOR 90 MG PO TABS: 90.0000 mg | ORAL_TABLET | Freq: Two times a day (BID) | ORAL | Status: DC

## 2016-09-01 MED ORDER — ASPIRIN 81 MG PO TBEC: 81.0000 mg | DELAYED_RELEASE_TABLET | Freq: Every day | ORAL | Status: DC

## 2016-09-01 MED ORDER — NITROGLYCERIN 0.4 MG SL SUBL: 0.4000 mg | SUBLINGUAL_TABLET | SUBLINGUAL | 2 refills | Status: DC | PRN

## 2016-09-01 NOTE — Progress Notes (Signed)
Hospital Problem List     Active Problems:   NSTEMI (non-ST elevated myocardial infarction) (HCC)   ACS (acute coronary syndrome) [facility])     Patient Profile:   Primary Cardiologist: Dr. Diona Browner - Shawn Hughes  50 yo male w/ PMH of tobacco use and HLD who presented to [facility] for chest pain. Found to have NSTEMI and transferred to [facility] for cath.   Subjective   Patient upset this AM, for he just learned his uncle passed away unexpectedly. Denies any chest discomfort or palpitations. Anxious to go home.   Inpatient Medications    . aspirin  81 mg Oral Pre-Cath  . aspirin EC  81 mg Oral Daily  . atorvastatin  80 mg Oral q1800  . levofloxacin  500 mg Oral Daily  . metoprolol tartrate  25 mg Oral BID  . predniSONE  20 mg Oral TID  . sodium chloride flush  3 mL Intravenous Q12H  . sodium chloride flush  3 mL Intravenous Q12H  . ticagrelor  90 mg Oral BID    Vital Signs    Vitals:   08/31/16 1102 08/31/16 1349 08/31/16 2043 09/01/16 0549  BP: 125/72 127/77 123/70 122/75  Pulse: 87 (!) 103 (!) 106 70  Resp:  16 16 16   Temp: 97.7 F (36.5 C) 97.7 F (36.5 C) 98.6 F (37 C) 97.6 F (36.4 C)  TempSrc: Oral Oral Oral Oral  SpO2: 98% 96% 97% 96%  Weight:      Height:        Intake/Output Summary (Last 24 hours) at 09/01/16 0800 Last data filed at 09/01/16 0230  Gross per 24 hour  Intake             1194 ml  Output              600 ml  Net              594 ml   Filed Weights   08/30/16 0943 08/31/16 0400  Weight: 205 lb (93 kg) 202 lb 13.2 oz (92 kg)    Physical Exam    General: Well developed, well nourished, male appearing in no acute distress. Head: Normocephalic, atraumatic.  Neck: Supple without bruits, JVD not elevated. Lungs:  Resp regular and unlabored, CTA without wheezing or rales. Heart: RRR, S1, S2, no S3, S4, or murmur; no rub. Abdomen: Soft, non-tender, non-distended with normoactive bowel sounds. No hepatomegaly. No rebound/guarding. No obvious  abdominal masses. Extremities: No clubbing, cyanosis, or edema. Distal pedal pulses are 2+ bilaterally. Neuro: Alert and oriented X 3. Moves all extremities spontaneously. Psych: Normal affect.  Labs    CBC  Recent Labs  08/31/16 1504 09/01/16 0246  WBC 25.4* 19.5*  NEUTROABS 23.1* 18.0*  HGB 15.0 15.6  HCT 45.4 46.5  MCV 96.6 96.1  PLT 294 295   Basic Metabolic Panel  Recent Labs  08/30/16 1011 08/31/16 0413  NA 133* 136  K 4.1 4.5  CL 102 106  CO2 22 23  GLUCOSE 278* 261*  BUN 13 13  CREATININE 0.81 0.89  CALCIUM 9.4 9.2   Liver Function Tests No results for input(s): AST, ALT, ALKPHOS, BILITOT, PROT, ALBUMIN in the last 72 hours. No results for input(s): LIPASE, AMYLASE in the last 72 hours. Cardiac Enzymes  Recent Labs  08/30/16 1011  TROPONINI 0.24*   BNP Invalid input(s): POCBNP D-Dimer  Recent Labs  08/30/16 1029  DDIMER 0.31   Hemoglobin A1C No results for input(s): HGBA1C  in the last 72 hours. Fasting Lipid Panel  Recent Labs  08/31/16 0413  CHOL 215*  HDL 30*  LDLCALC 165*  TRIG 99  CHOLHDL 7.2     Telemetry    NSR, HR in 80's - 90's. Peaking in 120's with activity.   ECG    NSR, HR 75 with no acute ST or T-wave changes.   Cardiac Studies and Radiology    Dg Chest 2 View  Result Date: 08/31/2016 CLINICAL DATA:  Cough.  Recent coronary stent placement EXAM: CHEST  2 VIEW COMPARISON:  August 30, 2016 FINDINGS: There is mild right base atelectasis. There is no edema or consolidation. The heart size and pulmonary vascularity are normal. No adenopathy. No bone lesions. No pneumothorax. IMPRESSION: Mild right base atelectasis. Lungs elsewhere clear. Stable cardiac silhouette. Electronically Signed   By: Bretta BangWilliam  Woodruff III M.D.   On: 08/31/2016 11:00   Dg Chest 2 View  Result Date: 08/30/2016 CLINICAL DATA:  Stabbing left-sided chest pain possible early pneumonia, current smoker. EXAM: CHEST  2 VIEW COMPARISON:  [facility] FINDINGS: The lungs are adequately inflated. There is no pneumothorax, pleural effusion, or alveolar pneumonia. The interstitial markings are coarse bilaterally. The heart and pulmonary vascularity are normal. The mediastinum is normal in width. The bony thorax is unremarkable. IMPRESSION: Mild chronic bronchitic changes. No acute pneumonia, CHF, nor other acute cardiopulmonary abnormality. Electronically Signed   By: David  SwazilandJordan M.D.   On: 08/30/2016 10:36    Echocardiogram: 08/31/2016   Left ventricle:  The cavity size was normal. Systolic function was normal. The estimated ejection fraction was in the range of 55% to 60%. ------------------------------------------------------------------- Aortic valve:   Structurally normal valve.   Cusp separation was normal.  Doppler:  Transvalvular velocity was within the normal range. There was no stenosis. There was no regurgitation. ------------------------------------------------------------------- Mitral valve:   Structurally normal valve.   Leaflet separation was normal.  Doppler:  Transvalvular velocity was within the normal range. There was no evidence for stenosis. There was no regurgitation.    Peak gradient (D): 3 mm Hg. ------------------------------------------------------------------- Left atrium:  The atrium was normal in size. ------------------------------------------------------------------- Right ventricle:  The cavity size was normal. Wall thickness was normal. Systolic function was normal. ------------------------------------------------------------------- Pulmonic valve:    The valve appears to be grossly normal. ------------------------------------------------------------------- Tricuspid valve:   Structurally normal valve.   Leaflet separation was normal.  Doppler:  Transvalvular velocity was within the normal range. There was no  regurgitation. ------------------------------------------------------------------- Pericardium:  There was no pericardial effusion. ------------------------------------------------------------------- Systemic veins: Inferior vena cava: The vessel was normal in size. The respirophasic diameter changes were in the normal range (>= 50%), consistent with normal central venous pressure.  Cardiac Catheterization: 08/30/2016  Prox RCA lesion, 40 %stenosed.  Mid Cx lesion, 10 %stenosed.  Prox LAD to Mid LAD lesion, 20 %stenosed.  A STENT PROMUS PREM MR 2.5X16 drug eluting stent was successfully placed.  Ramus lesion, 99 %stenosed.  Post intervention, there is a 0% residual stenosis.  There is mild left ventricular systolic dysfunction.  LV end diastolic pressure is mildly elevated.  The left ventricular ejection fraction is 50-55% by visual estimate.  There is no mitral valve regurgitation.   1. NSTEMI secondary to severe, ulcerated stenosis Ramus Intermediate branch 2. Successful PTCA/DES x 1 Ramus Intermediate branch 3. Mild non-obstructive disease in the LAD and RCA 4. The overall LV systolic function is preserved but there is hypokinesis of the anterolateral wall.  Recommendations: Will need DAPT with ASA and Brilinta x 1 year. Continue beta blocker and statin. Echo tomorrow then likely d/c home tomorrow.   Assessment & Plan    1. NSTEMI - no prior cardiac history but presented with chest pain and initial troponin elevated to 0.24. Cyclic troponin values were not trended.  - cath showed 40% prox RCA, 10% LCx, 20% LAD and 99% RI stenosis. Successful PCI performed with placement of DES. Will need to be on DAPT with ASA and Brilinta for 1 year.  - echo shows preserved EF of 55-60% with no WMA. - continue statin and BB.   2. HLD - Lipid Panel shows total cholesterol 215, HDL 30, and LDL 165. - continue high-intensity statin therapy.   3. Tobacco Use - cessation advised.    4. Leukocytosis - WBC count 26.8 on admission, peaked at 30.1. Now down-trending to 19.5. - UA negative and CXR showing mild right base atelectasis but lungs otherwise clear.  - has been on Levaquin 500mg  daily. Need to address continuing this or d/c'ing at time of discharge with no evidence of active infection.  - will need repeat CBC at time of follow-up appointment.   5. Elevated Glucose - Fasting glucose 261. A1c not yet checked. - needs to follow-up with PCP as an outpatient.   Signed, Ellsworth Lennox , PA-C 8:00 AM 09/01/2016 Pager: [phone]  I have seen and examined the patient along with Ellsworth Lennox , PA-C.  I have reviewed the chart, notes and new data.  I agree with PA's note.  Key new complaints: no further angina; distraught over death of his uncle Key examination changes: no CHF, no arrhythmia Key new findings / data: elevated WBC improving; no fever and no other signs of infection. Elevated glucose in DM range, but no signs of DKA by exam or labs.  PLAN: Will need referral for DM management. Repeat labs for DM and WBC at early follow up  Thurmon Fair, MD, [facility] [phone] 09/01/2016, 8:35 AM

## 2016-09-01 NOTE — Discharge Summary (Signed)
Discharge Summary    Patient ID: Shawn Hughes,  MRN: [id number], DOB/AGE: 48-Mar-1967 48 y.o.  Admit date: 08/30/2016 Discharge date: 09/01/2016  Primary Care Provider: [facility] Primary Cardiologist: Dr. Nada Maclachlan  Discharge Diagnoses    Principal Problem:   NSTEMI (non-ST elevated myocardial infarction) [facility]) Active Problems:   ACS (acute coronary syndrome) (HCC)   Tobacco use   History of Present Illness     Shawn Hughes is a 50 y.o. male with past medical history of tobacco use and HLD who presented to [facility] on 08/30/2016 for evaluation of chest pain.   Reported feeling "weak" the previous day and was seen at an Urgent Care and treated for possible early PNA having been started on steroids, antibiotics, and cough medicine.   He went home that evening and took his medication and awoke a few hours later with stabbing chest discomfort and radiating pain to his jaw and right arm. The pain lasted for several hours and with no improvement in his symptoms, EMS was called.   Initial labs showed a D-dimer was negative at 0.31, WBC count elevated at 26.8, Creatinine 0.81, Glucose 278. Troponin 0.24. EKG showed sinus tachycardia with ST depression noted in V2 and V3. CXR revealed chronic bronchitic changes but no acute pneumonia CHF or other acute cardiopulmonary abnormality.   He was transferred to [facility] for a cardiac catheterization later that day.   Hospital Course     Consultants: None  His catheterization showed 40% prox RCA, 10% LCx, 20% LAD and 99% RI stenosis. Successful PCI performed with placement of DES to the RI. He was started on DAPT with ASA and Brilinta and enrolled in the TWILIGHT study. Was also started on high-intensity statin and BB therapy. He tolerated the procedure well and no complications were noted.  The following morning, he denied any repeat episodes of chest discomfort or dyspnea with exertion.  Ambulated over 500 ft with cardiac rehab without any anginal symptoms. His WBC count was trending upwards to 30.1K, therefore a UA and CXR were obtained for further evaluation. UA was negative and CXR showed mild right base atelectasis but lungs otherwise clear. Echocardiogram was performed and showed a preserved EF of 55-60% with no WMA. He was continued on PTA Levaquin and observed for another night.  On 09/01/2016, the patient was visually upset as he learned of the sudden passing of his uncle and was anxious to go home. He denied any chest discomfort, palpitations, or dyspnea. WBC count was trending down to 19.5. With recent negative testing, his Levaquin was discontinued. Leukocytosis could be secondary to recent Prednisone use. Fasting glucose was elevated to 261 which could also be secondary to steroid use but he was instructed to follow-up with PCP in regards to his glucose and testing for DM. He will need a repeat BMET at the time of his follow-up appointment. Patient was also strongly encouraged multiple times throughout this hospitalization to quit smoking.  Shawn Hughes was last examined Dr. Royann Shivers and deemed stable for discharge. ASA and Brilinta were provided to the patient via TWILIGHT study. A staff message has been sent to arrange for a 7-day TOC appointment in [place]. He was discharged home in good condition.  _____________  Discharge Vitals Blood pressure 122/75, pulse 70, temperature 97.6 F (36.4 C), temperature source Oral, resp. rate 16, height 5\' 9"  (1.753 m), weight 202 lb 13.2 oz (92 kg), SpO2 96 %.  Filed Weights  08/30/16 0943 08/31/16 0400  Weight: 205 lb (93 kg) 202 lb 13.2 oz (92 kg)    Labs & Radiologic Studies     CBC  Recent Labs  08/31/16 1504 09/01/16 0246  WBC 25.4* 19.5*  NEUTROABS 23.1* 18.0*  HGB 15.0 15.6  HCT 45.4 46.5  MCV 96.6 96.1  PLT 294 295   Basic Metabolic Panel  Recent Labs  08/30/16 1011 08/31/16 0413  NA 133* 136  K 4.1  4.5  CL 102 106  CO2 22 23  GLUCOSE 278* 261*  BUN 13 13  CREATININE 0.81 0.89  CALCIUM 9.4 9.2   Liver Function Tests No results for input(s): AST, ALT, ALKPHOS, BILITOT, PROT, ALBUMIN in the last 72 hours. No results for input(s): LIPASE, AMYLASE in the last 72 hours. Cardiac Enzymes  Recent Labs  08/30/16 1011  TROPONINI 0.24*   BNP Invalid input(s): POCBNP D-Dimer  Recent Labs  08/30/16 1029  DDIMER 0.31   Hemoglobin A1C No results for input(s): HGBA1C in the last 72 hours. Fasting Lipid Panel  Recent Labs  08/31/16 0413  CHOL 215*  HDL 30*  LDLCALC 165*  TRIG 99  CHOLHDL 7.2   Thyroid Function Tests No results for input(s): TSH, T4TOTAL, T3FREE, THYROIDAB in the last 72 hours.  Invalid input(s): FREET3  Dg Chest 2 View  Result Date: 08/31/2016 CLINICAL DATA:  Cough.  Recent coronary stent placement EXAM: CHEST  2 VIEW COMPARISON:  August 30, 2016 FINDINGS: There is mild right base atelectasis. There is no edema or consolidation. The heart size and pulmonary vascularity are normal. No adenopathy. No bone lesions. No pneumothorax. IMPRESSION: Mild right base atelectasis. Lungs elsewhere clear. Stable cardiac silhouette. Electronically Signed   By: Bretta Bang III M.D.   On: 08/31/2016 11:00   Dg Chest 2 View  Result Date: 08/30/2016 CLINICAL DATA:  Stabbing left-sided chest pain possible early pneumonia, current smoker. EXAM: CHEST  2 VIEW COMPARISON:  No recent studies in [facility] FINDINGS: The lungs are adequately inflated. There is no pneumothorax, pleural effusion, or alveolar pneumonia. The interstitial markings are coarse bilaterally. The heart and pulmonary vascularity are normal. The mediastinum is normal in width. The bony thorax is unremarkable. IMPRESSION: Mild chronic bronchitic changes. No acute pneumonia, CHF, nor other acute cardiopulmonary abnormality. Electronically Signed   By: David  Swaziland M.D.   On: 08/30/2016 10:36    Diagnostic  Studies/Procedures    Echocardiogram: 08/31/2016   Left ventricle: The cavity size was normal. Systolic function was normal. The estimated ejection fraction was in the range of 55% to 60%. ------------------------------------------------------------------- Aortic valve: Structurally normal valve. Cusp separation was normal. Doppler: Transvalvular velocity was within the normal range. There was no stenosis. There was no regurgitation. ------------------------------------------------------------------- Mitral valve: Structurally normal valve. Leaflet separation was normal. Doppler: Transvalvular velocity was within the normal range. There was no evidence for stenosis. There was no regurgitation. Peak gradient (D): 3 mm Hg. ------------------------------------------------------------------- Left atrium: The atrium was normal in size. ------------------------------------------------------------------- Right ventricle: The cavity size was normal. Wall thickness was normal. Systolic function was normal. ------------------------------------------------------------------- Pulmonic valve: The valve appears to be grossly normal. ------------------------------------------------------------------- Tricuspid valve: Structurally normal valve. Leaflet separation was normal. Doppler: Transvalvular velocity was within the normal range. There was no regurgitation. ------------------------------------------------------------------- Pericardium: There was no pericardial effusion. ------------------------------------------------------------------- Systemic veins: Inferior vena cava: The vessel was normal in size. The respirophasic diameter changes were in the normal range (>= 50%), consistent with normal central venous pressure.  Cardiac Catheterization: 08/30/2016  Prox RCA lesion, 40 %stenosed.  Mid Cx lesion, 10 %stenosed.  Prox LAD to Mid LAD lesion, 20  %stenosed.  A STENT PROMUS PREM MR 2.5X16 drug eluting stent was successfully placed.  Ramus lesion, 99 %stenosed.  Post intervention, there is a 0% residual stenosis.  There is mild left ventricular systolic dysfunction.  LV end diastolic pressure is mildly elevated.  The left ventricular ejection fraction is 50-55% by visual estimate.  There is no mitral valve regurgitation.  1. NSTEMI secondary to severe, ulcerated stenosis Ramus Intermediate branch 2. Successful PTCA/DES x 1 Ramus Intermediate branch 3. Mild non-obstructive disease in the LAD and RCA 4. The overall LV systolic function is preserved but there is hypokinesis of the anterolateral wall.   Recommendations: Will need DAPT with ASA and Brilinta x 1 year. Continue beta blocker and statin. Echo tomorrow then likely d/c home tomorrow.     Disposition   Pt is being discharged home today in good condition.  Follow-up Plans & Appointments    Follow-up Information    [facility] .   Specialty:  Family Medicine Why:  Follow-up with Primary Care Provider within 2 weeks. Need Glucose rechecked at that time.  Contact information: [address] [place] [id number] [phone]        Nona DellSamuel McDowell, MD .   Specialty:  Cardiology Why:  The office will call you to arrange follow-up. If you do not hear from them in 3 business days, please call the number provided.  Contact information: [address] [place] [id number] [phone]          Discharge Instructions    Amb Referral to Cardiac Rehabilitation    Complete by:  As directed    Diagnosis:   Coronary Stents NSTEMI     Diet - low sodium heart healthy    Complete by:  As directed    Discharge instructions    Complete by:  As directed    PLEASE REMEMBER TO BRING ALL OF YOUR MEDICATIONS TO EACH OF YOUR FOLLOW-UP OFFICE VISITS.  PLEASE ATTEND ALL SCHEDULED FOLLOW-UP APPOINTMENTS.   Activity: Increase  activity slowly as tolerated. You may shower, but no soaking baths (or swimming) for 1 week. No driving for 1 week. No lifting over 10 lbs for 2 weeks. No sexual activity for 2 weeks.   You May Return to Work: in 1 week (if applicable)  Wound Care: You may wash cath site gently with soap and water. Keep cath site clean and dry. If you notice pain, swelling, bleeding or pus at your cath site, please call [phone].   Increase activity slowly    Complete by:  As directed       Discharge Medications     Medication List    STOP taking these medications   ibuprofen 200 MG tablet Commonly known as:  ADVIL,MOTRIN   levofloxacin 500 MG tablet Commonly known as:  LEVAQUIN   predniSONE 20 MG tablet Commonly known as:  DELTASONE     TAKE these medications   aspirin 81 MG EC tablet Take 1 tablet (81 mg total) by mouth daily. Notes to patient:  FROM TWILIGHT STUDY   atorvastatin 80 MG tablet Commonly known as:  LIPITOR Take 1 tablet (80 mg total) by mouth daily at 6 PM.   metoprolol tartrate 25 MG tablet Commonly known as:  LOPRESSOR Take 1 tablet (25 mg total) by mouth 2 (two) times daily.   nitroGLYCERIN 0.4 MG SL  tablet Commonly known as:  NITROSTAT Place 1 tablet (0.4 mg total) under the tongue every 5 (five) minutes x 3 doses as needed for chest pain.   ticagrelor 90 MG Tabs tablet Commonly known as:  BRILINTA Take 1 tablet (90 mg total) by mouth 2 (two) times daily. Notes to patient:  FROM TWILIGHT STUDY       Aspirin prescribed at discharge?  Yes High Intensity Statin Prescribed? (Lipitor 40-80mg  or Crestor 20-40mg ): Yes Beta Blocker Prescribed? Yes For EF 40% or less, Was ACEI/ARB Prescribed? No: EF > 40% ADP Receptor Inhibitor Prescribed? (i.e. Plavix etc.-Includes Medically Managed Patients): Yes For EF <40%, Aldosterone Inhibitor Prescribed? No: N/A Was EF assessed during THIS hospitalization? Yes - Cath and Echo Was Cardiac Rehab II ordered? (Included  Medically managed Patients): Yes   Allergies Allergies  Allergen Reactions  . Hydrocodone Itching     Outstanding Labs/Studies   CBC and BMET at follow-up appointment.   Duration of Discharge Encounter   Greater than 30 minutes including physician time.  Signed, Ellsworth Lennox, PA-C 09/01/2016, 10:04 AM

## 2016-09-02 ENCOUNTER — Other Ambulatory Visit: Payer: Self-pay | Admitting: *Deleted

## 2016-09-02 ENCOUNTER — Encounter (HOSPITAL_COMMUNITY): Payer: Self-pay | Admitting: Cardiovascular Disease

## 2016-09-02 MED ORDER — AMBULATORY NON FORMULARY MEDICATION: 81.0000 mg | Freq: Every day | Status: DC

## 2016-09-02 MED ORDER — AMBULATORY NON FORMULARY MEDICATION: 90.0000 mg | Freq: Two times a day (BID) | Status: DC

## 2016-09-02 MED FILL — Lidocaine HCl Local Preservative Free (PF) Inj 1%: INTRAMUSCULAR | Qty: 30 | Status: AC

## 2016-09-03 ENCOUNTER — Telehealth: Payer: Self-pay

## 2016-09-03 NOTE — Telephone Encounter (Signed)
[facility].  Patient understands to follow up with provider Lyman BishopLawrence NP on 09/10/16 at 330 pm at [place]. Patient understands discharge instructions? Yes   Patient understands medications and regiment? Yes  Patient understands to bring all medications to this visit? Yes  Front reception desk Raechel Acheerry Goins spoke with family

## 2016-09-03 NOTE — Telephone Encounter (Signed)
-----   Message from Dyane Dustmanerry L Goins sent at 09/03/2016  9:37 AM EDT ----- Regarding: FW: Hospital Follow-up TCM appointment scheduled. Patient aware.   Thanks, Aurther Lofterry ----- Message ----- From: Ellsworth LennoxBrittany M Strader, PA Sent: 09/01/2016   9:46 AM To: Dyane Dustmanerry L Goins, [facility] Triage Subject: Hospital Follow-up                             Please schedule this patient for a follow-up appointment and call them with that information.  Primary Cardiologist: Dr. Diona BrownerMcDowell Date of Discharge: 09/01/2016 Appointment Needed Within: 7 day TOC Appointment Type: 7-day TOC with an APP.  Thank you! [place]ittany M. [place] OuchStrader,  PA-C

## 2016-09-10 ENCOUNTER — Encounter: Payer: Self-pay | Admitting: Adult Health

## 2016-09-10 ENCOUNTER — Encounter: Payer: Self-pay | Admitting: *Deleted

## 2016-09-10 ENCOUNTER — Ambulatory Visit (INDEPENDENT_AMBULATORY_CARE_PROVIDER_SITE_OTHER): Payer: BLUE CROSS/BLUE SHIELD | Admitting: Adult Health

## 2016-09-10 VITALS — BP 124/84 | HR 100 | Ht 69.0 in | Wt 197.0 lb

## 2016-09-10 DIAGNOSIS — F419 Anxiety disorder, unspecified: Secondary | ICD-10-CM

## 2016-09-10 DIAGNOSIS — F3289 Other specified depressive episodes: Secondary | ICD-10-CM | POA: Diagnosis not present

## 2016-09-10 DIAGNOSIS — I2510 Atherosclerotic heart disease of native coronary artery without angina pectoris: Secondary | ICD-10-CM | POA: Diagnosis not present

## 2016-09-10 NOTE — Progress Notes (Signed)
Name: Shawn Hughes    DOB: 1965-12-07  Age: 50 y.o.  MR#: [id number]       PCP:  [facility]      Insurance: Payor: BLUE CROSS BLUE SHIELD / Plan: BCBS OTHER / Product Type: *No Product type* /   CC:   No chief complaint on file.   VS Vitals:   09/10/16 1548  BP: 124/84  Pulse: 100  SpO2: 98%  Weight: 197 lb (89.4 kg)  Height: 5\' 9"  (1.753 m)    Weights Current Weight  09/10/16 197 lb (89.4 kg)  08/31/16 202 lb 13.2 oz (92 kg)  02/04/15 205 lb (93 kg)    Blood Pressure  BP Readings from Last 3 Encounters:  09/10/16 124/84  09/01/16 122/75  02/04/15 130/100     Admit date:  (Not on file) Last encounter with RMR:  Visit date not found   Allergy Hydrocodone  Current Outpatient Prescriptions  Medication Sig Dispense Refill  . AMBULATORY NON FORMULARY MEDICATION Take 90 mg by mouth 2 (two) times daily. Medication Name: Brilinta 90 mg BID (TWILIGHT Research study PROVIDED)    . AMBULATORY NON FORMULARY MEDICATION Take 81 mg by mouth daily. Medication Name: Aspirin 81 mg Daily (TWILIGHT Research Study Provided)    . atorvastatin (LIPITOR) 80 MG tablet Take 1 tablet (80 mg total) by mouth daily at 6 PM. 30 tablet 6  . metoprolol tartrate (LOPRESSOR) 25 MG tablet Take 1 tablet (25 mg total) by mouth 2 (two) times daily. 60 tablet 6  . nitroGLYCERIN (NITROSTAT) 0.4 MG SL tablet Place 1 tablet (0.4 mg total) under the tongue every 5 (five) minutes x 3 doses as needed for chest pain. 25 tablet 2   No current facility-administered medications for this visit.     Discontinued Meds:   There are no discontinued medications.  Patient Active Problem List   Diagnosis Date Noted  . Tobacco use 09/01/2016  . ACS (acute coronary syndrome) (HCC) 08/30/2016  . NSTEMI (non-ST elevated myocardial infarction) (HCC)   . Loose stools 09/02/2013  . GERD (gastroesophageal reflux disease) 09/02/2013  . Chest pain 01/09/2012  . WEIGHT LOSS 08/03/2010  . NAUSEA WITH  VOMITING 08/03/2010  . CHANGE IN BOWELS 08/03/2010  . EPIGASTRIC PAIN 08/03/2010    LABS    Component Value Date/Time   NA 136 08/31/2016 0413   NA 133 (L) 08/30/2016 1011   NA 138 02/04/2015 1820   K 4.5 08/31/2016 0413   K 4.1 08/30/2016 1011   K 3.5 02/04/2015 1820   CL 106 08/31/2016 0413   CL 102 08/30/2016 1011   CL 108 02/04/2015 1820   CO2 23 08/31/2016 0413   CO2 22 08/30/2016 1011   CO2 22 02/04/2015 1820   GLUCOSE 261 (H) 08/31/2016 0413   GLUCOSE 278 (H) 08/30/2016 1011   GLUCOSE 127 (H) 02/04/2015 1820   BUN 13 08/31/2016 0413   BUN 13 08/30/2016 1011   BUN 11 02/04/2015 1820   CREATININE 0.89 08/31/2016 0413   CREATININE 0.81 08/30/2016 1011   CREATININE 1.24 02/04/2015 1820   CALCIUM 9.2 08/31/2016 0413   CALCIUM 9.4 08/30/2016 1011   CALCIUM 9.8 02/04/2015 1820   GFRNONAA >60 08/31/2016 0413   GFRNONAA >60 08/30/2016 1011   GFRNONAA 68 (L) 02/04/2015 1820   GFRAA >60 08/31/2016 0413   GFRAA >60 08/30/2016 1011   GFRAA 79 (L) 02/04/2015 1820   CMP     Component Value Date/Time   NA 136  08/31/2016 0413   K 4.5 08/31/2016 0413   CL 106 08/31/2016 0413   CO2 23 08/31/2016 0413   GLUCOSE 261 (H) 08/31/2016 0413   BUN 13 08/31/2016 0413   CREATININE 0.89 08/31/2016 0413   CALCIUM 9.2 08/31/2016 0413   GFRNONAA >60 08/31/2016 0413   GFRAA >60 08/31/2016 0413       Component Value Date/Time   WBC 19.5 (H) 09/01/2016 0246   WBC 25.4 (H) 08/31/2016 1504   WBC 30.1 (H) 08/31/2016 0413   HGB 15.6 09/01/2016 0246   HGB 15.0 08/31/2016 1504   HGB 15.4 08/31/2016 0413   HCT 46.5 09/01/2016 0246   HCT 45.4 08/31/2016 1504   HCT 45.8 08/31/2016 0413   MCV 96.1 09/01/2016 0246   MCV 96.6 08/31/2016 1504   MCV 95.8 08/31/2016 0413    Lipid Panel     Component Value Date/Time   CHOL 215 (H) 08/31/2016 0413   TRIG 99 08/31/2016 0413   HDL 30 (L) 08/31/2016 0413   CHOLHDL 7.2 08/31/2016 0413   VLDL 20 08/31/2016 0413   LDLCALC 165 (H) 08/31/2016  0413    ABG No results found for: PHART, PCO2ART, PO2ART, HCO3, TCO2, ACIDBASEDEF, O2SAT   No results found for: TSH BNP (last 3 results) No results for input(s): BNP in the last 8760 hours.  ProBNP (last 3 results) No results for input(s): PROBNP in the last 8760 hours.  Cardiac Panel (last 3 results) No results for input(s): CKTOTAL, CKMB, TROPONINI, RELINDX in the last 72 hours.  Iron/TIBC/Ferritin/ %Sat No results found for: IRON, TIBC, FERRITIN, IRONPCTSAT   EKG Orders placed or performed during the hospital encounter of 08/30/16  . EKG 12-Lead  . EKG 12-Lead  . EKG 12-Lead  . EKG 12-Lead  . EKG 12-Lead  . EKG 12-Lead  . EKG 12-Lead  . EKG 12-Lead  . EKG 12-Lead  . EKG 12-Lead  . EKG 12-Lead  . EKG     Prior Assessment and Plan Problem List as of 09/10/2016 Reviewed: 09/02/2013  5:05 PM by Gelene MinkAnna W Boone, NP     Cardiovascular and Mediastinum   NSTEMI (non-ST elevated myocardial infarction) [facility](HCC)   ACS (acute coronary syndrome) (HCC)     Digestive   NAUSEA WITH VOMITING   GERD (gastroesophageal reflux disease)   Last Assessment & Plan 09/02/2013 Office Visit Written 09/02/2013  5:07 PM by Nira RetortAnna W Sams, NP    Not on a PPI. 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 Notes intermittent indigestion with occasional postprandial vomiting. Restart PPI daily; I have sent Protonix to the pharmacy.         Other   WEIGHT LOSS   CHANGE IN BOWELS   EPIGASTRIC PAIN   Last Assessment & Plan 01/09/2012 Office Visit Written 01/09/2012 11:13 AM by West BaliSandi L Fields, MD    ? NSAID GASTRITIS OR PUD WITH RADIATION TO CHEST/NECK.  EGD TOMORROW. OPV TBS AFTER EGD CONSIDER CT CHEST/ABDOMEN IF NO SIGNIFICANT FINDINGS ON EGD. MAY NEED BASW TO EVALUATE FOR ESOPHAGEAL SPASM.      Chest pain   Last Assessment & Plan 01/09/2012 Office Visit Written 01/09/2012 11:15 AM by West BaliSandi L Fields, MD    CONSTANT  & unchanged by food, which is not typical for GI  chest pain. Pt may have non-gi cause for pain, doubt atypical reflux or esophageal spasm, less likely occult malignancy. RISK FACTOR FOR MALIGNANCY: TOBACCO USE  EGD TOMORROW. OPV TBS AFTER EGD  CONSIDER CT CHEST/ABDOMEN IF NO SIGNIFICANT FINDINGS ON EGD. MAY NEED BASW TO EVALUATE FOR ESOPHAGEAL SPASM.      Loose stools   Last Assessment & Plan 09/02/2013 Office Visit Written 09/02/2013  5:06 PM by Nira Retort, NP    Increased from baseline, associated with constant abdominal pain, postprandial component. No rectal bleeding. CT from [facility] requested. Doubt infectious process; will obtain Cdiff PCR, stool culture, Giardia, provide Bentyl, and consider colonoscopy in future if no improvement with supportive measures.       Tobacco use       Imaging: Dg Chest 2 View  Result Date: 08/31/2016 CLINICAL DATA:  Cough.  Recent coronary stent placement EXAM: CHEST  2 VIEW COMPARISON:  August 30, 2016 FINDINGS: There is mild right base atelectasis. There is no edema or consolidation. The heart size and pulmonary vascularity are normal. No adenopathy. No bone lesions. No pneumothorax. IMPRESSION: Mild right base atelectasis. Lungs elsewhere clear. Stable cardiac silhouette. Electronically Signed   By: Bretta Bang III M.D.   On: 08/31/2016 11:00   Dg Chest 2 View  Result Date: 08/30/2016 CLINICAL DATA:  Stabbing left-sided chest pain possible early pneumonia, current smoker. EXAM: CHEST  2 VIEW COMPARISON:  No recent studies in [facility] FINDINGS: The lungs are adequately inflated. There is no pneumothorax, pleural effusion, or alveolar pneumonia. The interstitial markings are coarse bilaterally. The heart and pulmonary vascularity are normal. The mediastinum is normal in width. The bony thorax is unremarkable. IMPRESSION: Mild chronic bronchitic changes. No acute pneumonia, CHF, nor other acute cardiopulmonary abnormality. Electronically Signed   By: David  [place]az[place]and M.D.   On: 08/30/2016 10:36

## 2016-09-10 NOTE — Patient Instructions (Signed)
Your physician recommends that you schedule a follow-up appointment in: 3 Months with Dr. McDowell.   Your physician recommends that you continue on your current medications as directed. Please refer to the Current Medication list given to you today.  If you need a refill on your cardiac medications before your next appointment, please call your pharmacy.  Thank you for choosing [facility]!    

## 2016-09-10 NOTE — Progress Notes (Signed)
Cardiology Office Note   Date:  09/10/2016   ID:  Shawn Hughes, DOB 09/28/1966, MRN [id number]  [facility]  Cardiologist: [place]/ Joni ReiningKathryn Lawrence, NP   Chief Complaint  Patient presents with  . Coronary Artery Disease      History of Present Illness: Shawn Hughes is a 50 y.o. male who presents for posthospitalization follow-up after admission for chest pain and non-ST elevation MI after being seen at [facility] emergency room for weakness chest pain and dyspnea. He had subsequent cardiac catheterization.  His catheterization revealed 40% prox RCA, 10% LCx, 20% LAD and 99% RI stenosis. Successful PCI performed with placement of DES to the RI. He was started on DAPT with ASA and Brilinta and enrolled in the TWILIGHT study. Was also started on high-intensity statin and BB therapy.   The patient was discharged home, and on that day found that his uncle had suddenly died. He became visibly upset and was very anxious to be discharged. He was continued on study medications. He was continued on atorvastatin, metoprolol 25 mg twice a day, And is here for TOC follow up.  He comes today with anxiety and depression, and ongoing intermittent chest discomfort. He states he is worried and is having panic attacks over possible recurrence of chest pain and further worsening of his coronary artery disease. He is medically compliant. He has quit smoking.   Past Medical History:  Diagnosis Date  . CAD (coronary artery disease)    a. 08/2016: cath showing 40% prox RCA, 10% LCx, 20% LAD and 99% RI stenosis. DES to RI.  Marland Kitchen. GERD (gastroesophageal reflux disease)   . History of nephrolithiasis   . HLD (hyperlipidemia)   . Tobacco use     Past Surgical History:  Procedure Laterality Date  . CARDIAC CATHETERIZATION N/A 08/30/2016   Procedure: Left Heart Cath and Coronary Angiography;  Surgeon: Kathleene Hazelhristopher D McAlhany, MD;  [facility];  Service:  Cardiovascular;  Laterality: N/A;  . CARDIAC CATHETERIZATION N/A 08/30/2016   Procedure: Coronary Stent Intervention;  Surgeon: Kathleene Hazelhristopher D McAlhany, MD;  Location: [facility] INVASIVE CV LAB;  Service: Cardiovascular;  Laterality: N/A;  . ESOPHAGOGASTRODUODENOSCOPY  01/10/2012   WGN:[id number]:Stricture in the distal esophagus2O TO GERD/Mild gastritis  . LACERATION REPAIR     Right arm     Current Outpatient Prescriptions  Medication Sig Dispense Refill  . AMBULATORY NON FORMULARY MEDICATION Take 90 mg by mouth 2 (two) times daily. Medication Name: Brilinta 90 mg BID (TWILIGHT Research study PROVIDED)    . AMBULATORY NON FORMULARY MEDICATION Take 81 mg by mouth daily. Medication Name: Aspirin 81 mg Daily (TWILIGHT Research Study Provided)    . atorvastatin (LIPITOR) 80 MG tablet Take 1 tablet (80 mg total) by mouth daily at 6 PM. 30 tablet 6  . metoprolol tartrate (LOPRESSOR) 25 MG tablet Take 1 tablet (25 mg total) by mouth 2 (two) times daily. 60 tablet 6  . nitroGLYCERIN (NITROSTAT) 0.4 MG SL tablet Place 1 tablet (0.4 mg total) under the tongue every 5 (five) minutes x 3 doses as needed for chest pain. 25 tablet 2   No current facility-administered medications for this visit.     Allergies:   Hydrocodone    Social History:  The patient  reports that he quit smoking 11 days ago. His smoking use included Cigarettes. He started smoking about 33 years ago. He has a 7.50 pack-year smoking history. He has never used smokeless tobacco. He reports  that he drinks about 1.8 oz of alcohol per week . He reports that he does not use drugs.   Family History:  The patient's family history includes CAD (age of onset: 45) in his father; CAD (age of onset: 31) in his mother; Hypertension in his brother.    ROS: All other systems are reviewed and negative. Unless otherwise mentioned in H&P    PHYSICAL EXAM: VS:  BP 124/84   Pulse 100   Ht 5\' 9"  (1.753 m)   Wt 197 lb (89.4 kg)   SpO2 98%   BMI 29.09 kg/m  , BMI  Body mass index is 29.09 kg/m. GEN: Well nourished, well developed, in no acute distress  HEENT: normal  Neck: no JVD, carotid bruits, or masses Cardiac: RRR; no murmurs, rubs, or gallops,no edema  Respiratory: Clear to auscultation bilaterally, normal work of breathing GI: soft, nontender, nondistended, + BS MS: no deformity or atrophy  Skin: warm and dry, no rash Neuro:  Strength and sensation are intact Psych: euthymic mood, full affect  Recent Labs: 08/31/2016: BUN 13; Creatinine, Ser 0.89; Potassium 4.5; Sodium 136 09/01/2016: Hemoglobin 15.6; Platelets 295    Lipid Panel    Component Value Date/Time   CHOL 215 (H) 08/31/2016 0413   TRIG 99 08/31/2016 0413   HDL 30 (L) 08/31/2016 0413   CHOLHDL 7.2 08/31/2016 0413   VLDL 20 08/31/2016 0413   LDLCALC 165 (H) 08/31/2016 0413      Wt Readings from Last 3 Encounters:  09/10/16 197 lb (89.4 kg)  08/31/16 202 lb 13.2 oz (92 kg)  02/04/15 205 lb (93 kg)     ASSESSMENT AND PLAN:  1. Coronary artery disease: Status post non-ST elevation MI requiring cardiac catheterization. Status post drug-eluting stent to his R eye, with nonobstructive disease elsewhere. He will continue current medication regimen to include TWILiGHT study medication. He will be referred to cardiac rehabilitation. A letter was written on his behalf to his employer informing them that he was here today.  2. Anxiety and depression over her health status: Reassurance has been given but no antianxiety medications are prescribed. I have referred him to Dr. Tenny Craw, psychiatry, for further evaluation of his current anxiety and depression and medical management thereof. We will not be providing medications for this patient to alleviate his anxiety.  3. Tobacco abuse: He quit smoking one week ago. I congratulated him on his lifestyle change and encouraged him to continue.   Current medicines are reviewed at length with the patient today.    Labs/ tests ordered today  include:   Orders Placed This Encounter  Procedures  . Ambulatory referral to Psychiatry     Disposition:   FU with 3 months  Signed, Joni Reining, NP  09/10/2016 4:51 PM    [facility] [address], [place], [place] [address] Phone: [phone]; Fax: [phone]

## 2016-10-01 ENCOUNTER — Telehealth: Payer: Self-pay | Admitting: *Deleted

## 2016-10-01 NOTE — Telephone Encounter (Signed)
TWILIGHT Research study month 1 telephone call attempted. Left message for patient to call research office.

## 2016-10-02 ENCOUNTER — Telehealth (HOSPITAL_COMMUNITY): Payer: Self-pay | Admitting: *Deleted

## 2016-10-02 NOTE — Telephone Encounter (Signed)
left voice message regarding an appointment. 

## 2016-10-03 ENCOUNTER — Telehealth: Payer: Self-pay | Admitting: *Deleted

## 2016-10-03 NOTE — Telephone Encounter (Signed)
Left message for patient to call research office for TWILIGHT Research study month 1 telephone follow up and to schedule month 3 appointment to pick up more Brilinta before 18/JAN/2018.

## 2016-10-07 ENCOUNTER — Encounter: Payer: Self-pay | Admitting: *Deleted

## 2016-10-07 DIAGNOSIS — Z006 Encounter for examination for normal comparison and control in clinical research program: Secondary | ICD-10-CM

## 2016-10-07 NOTE — Progress Notes (Signed)
Spoke with Subject's wife, Shawn Hughes, this morning.  She stated that she had concerns about the medications that her husband is taking. Forgot to tell the physician at discharge has tried to take Lipitor in the past, 40 mg and had myalgias and a feeling of fatigue.  Since subject left the hospital on Lipitor 80 mg those feelings have returned and apparently are exacerbated due to the higher dose. Per wife subject has stopped taking the Lipitor.  Subject is also experiencing shortness of breath which is severe enough to disrupt his sleep. Subject is starting to skip doses of the Brilinta that he has supplied through the [employer] research study.  Subject is also experiencing intermittent nose bleeds.  She also stated that the subject is experiencing tiredness and weakness associated with feelings that he is about to "pass out."  She felt that maybe his blood pressure medication was attributing to that.  I strongly encouraged her to contact [facility] in [place] and discuss these issues with his cardiologist. She was very appreciative of our conversation and stated that she would call the office for him.

## 2016-12-12 ENCOUNTER — Telehealth: Payer: Self-pay | Admitting: *Deleted

## 2016-12-12 NOTE — Telephone Encounter (Signed)
[facility]ed to contact [facility] [phone]. The person answering stated Shawn Hughes his wife works at this Number. Message left for Shawn Hughes to call research office. Also attempted to call Mobile Epic provided number [phone] no answer and no way to leave message. Several other phone attempts and Letter mailed for patient to schedule TWILIGHT month 3 randomization visit. Patient should be out of BRILINTA After today. I will also inform Cardiology Office.

## 2016-12-12 NOTE — Telephone Encounter (Signed)
Attempted to reach patient with home number

## 2016-12-13 ENCOUNTER — Other Ambulatory Visit: Payer: Self-pay | Admitting: Adult Health

## 2016-12-13 ENCOUNTER — Encounter (HOSPITAL_COMMUNITY): Payer: Self-pay | Admitting: Emergency Medicine

## 2016-12-13 ENCOUNTER — Emergency Department (HOSPITAL_COMMUNITY)
Admission: EM | Admit: 2016-12-13 | Discharge: 2016-12-13 | Disposition: A | Discharge status: Home / Self Care | Payer: BLUE CROSS/BLUE SHIELD | Attending: Dermatology | Admitting: Dermatology

## 2016-12-13 DIAGNOSIS — Z5321 Procedure and treatment not carried out due to patient leaving prior to being seen by health care provider: Secondary | ICD-10-CM | POA: Insufficient documentation

## 2016-12-13 DIAGNOSIS — R1012 Left upper quadrant pain: Secondary | ICD-10-CM | POA: Insufficient documentation

## 2016-12-13 DIAGNOSIS — Z87891 Personal history of nicotine dependence: Secondary | ICD-10-CM | POA: Insufficient documentation

## 2016-12-13 DIAGNOSIS — I2510 Atherosclerotic heart disease of native coronary artery without angina pectoris: Secondary | ICD-10-CM | POA: Insufficient documentation

## 2016-12-13 DIAGNOSIS — E119 Type 2 diabetes mellitus without complications: Secondary | ICD-10-CM | POA: Insufficient documentation

## 2016-12-13 MED ORDER — TICAGRELOR 60 MG PO TABS: 90.0000 mg | ORAL_TABLET | Freq: Two times a day (BID) | ORAL | Status: DC

## 2016-12-13 NOTE — Telephone Encounter (Signed)
Please order Brilinta. If he needs samples please provide.

## 2016-12-13 NOTE — ED Triage Notes (Signed)
Stopped brelintta  Two day onset of LUQ pain with N/V

## 2016-12-14 ENCOUNTER — Emergency Department (HOSPITAL_COMMUNITY)
Admission: EM | Admit: 2016-12-14 | Discharge: 2016-12-14 | Disposition: A | Discharge status: Home Health | Payer: PRIVATE HEALTH INSURANCE | Attending: Emergency Medicine | Admitting: Emergency Medicine

## 2016-12-14 ENCOUNTER — Encounter (HOSPITAL_COMMUNITY): Payer: Self-pay | Admitting: Emergency Medicine

## 2016-12-14 DIAGNOSIS — E1165 Type 2 diabetes mellitus with hyperglycemia: Secondary | ICD-10-CM | POA: Insufficient documentation

## 2016-12-14 DIAGNOSIS — R0602 Shortness of breath: Secondary | ICD-10-CM | POA: Diagnosis not present

## 2016-12-14 DIAGNOSIS — Z87891 Personal history of nicotine dependence: Secondary | ICD-10-CM | POA: Diagnosis not present

## 2016-12-14 DIAGNOSIS — R109 Unspecified abdominal pain: Secondary | ICD-10-CM | POA: Diagnosis not present

## 2016-12-14 DIAGNOSIS — R739 Hyperglycemia, unspecified: Secondary | ICD-10-CM | POA: Diagnosis present

## 2016-12-14 DIAGNOSIS — I2510 Atherosclerotic heart disease of native coronary artery without angina pectoris: Secondary | ICD-10-CM | POA: Diagnosis not present

## 2016-12-14 DIAGNOSIS — Z79899 Other long term (current) drug therapy: Secondary | ICD-10-CM | POA: Insufficient documentation

## 2016-12-14 DIAGNOSIS — R112 Nausea with vomiting, unspecified: Secondary | ICD-10-CM | POA: Insufficient documentation

## 2016-12-14 LAB — URINALYSIS, MICROSCOPIC (REFLEX)

## 2016-12-14 LAB — COMPREHENSIVE METABOLIC PANEL
ALK PHOS: 91 U/L (ref 38–126)
ALT: 27 U/L (ref 17–63)
ANION GAP: 9 (ref 5–15)
AST: 20 U/L (ref 15–41)
Albumin: 3.7 g/dL (ref 3.5–5.0)
BUN: 10 mg/dL (ref 6–20)
CALCIUM: 9 mg/dL (ref 8.9–10.3)
CHLORIDE: 102 mmol/L (ref 101–111)
CO2: 24 mmol/L (ref 22–32)
Creatinine, Ser: 0.66 mg/dL (ref 0.61–1.24)
GFR calc non Af Amer: >60 mL/min (ref >60)
Glucose, Bld: 376 mg/dL — ABNORMAL HIGH (ref 65–99)
Potassium: 4.1 mmol/L (ref 3.5–5.1)
SODIUM: 135 mmol/L (ref 135–145)
Total Bilirubin: 0.6 mg/dL (ref 0.3–1.2)
Total Protein: 6.8 g/dL (ref 6.5–8.1)

## 2016-12-14 LAB — URINALYSIS, ROUTINE W REFLEX MICROSCOPIC
Bilirubin Urine: NEGATIVE
Glucose, UA: >=500 mg/dL — AB
Hgb urine dipstick: NEGATIVE
KETONES UR: 15 mg/dL — AB
LEUKOCYTES UA: NEGATIVE
NITRITE: NEGATIVE
PH: 5.5 (ref 5.0–8.0)
PROTEIN: NEGATIVE mg/dL
Specific Gravity, Urine: 1.01 (ref 1.005–1.030)

## 2016-12-14 LAB — CBC
HCT: 44.1 % (ref 39.0–52.0)
Hemoglobin: 15.3 g/dL (ref 13.0–17.0)
MCH: 32.1 pg (ref 26.0–34.0)
MCHC: 34.7 g/dL (ref 30.0–36.0)
MCV: 92.5 fL (ref 78.0–100.0)
PLATELETS: 272 10*3/uL (ref 150–400)
RBC: 4.77 MIL/uL (ref 4.22–5.81)
RDW: 12.7 % (ref 11.5–15.5)
WBC: 6.2 10*3/uL (ref 4.0–10.5)

## 2016-12-14 LAB — CBG MONITORING, ED
GLUCOSE-CAPILLARY: 392 mg/dL — AB (ref 65–99)
Glucose-Capillary: 222 mg/dL — ABNORMAL HIGH (ref 65–99)

## 2016-12-14 LAB — LIPASE, BLOOD: Lipase: 18 U/L (ref 11–51)

## 2016-12-14 MED ORDER — METFORMIN HCL 500 MG PO TABS: 500.0000 mg | ORAL_TABLET | Freq: Two times a day (BID) | ORAL | 0 refills | Status: DC

## 2016-12-14 MED ORDER — INSULIN ASPART 100 UNIT/ML ~~LOC~~ SOLN
10.0000 [IU] | Freq: Once | SUBCUTANEOUS | Status: AC
  Administered 2016-12-14: 10 [IU] via SUBCUTANEOUS
  Filled 2016-12-14: qty 1

## 2016-12-14 MED ORDER — GLIPIZIDE 5 MG PO TABS: 5.0000 mg | ORAL_TABLET | Freq: Every day | ORAL | 0 refills | Status: DC

## 2016-12-14 MED ORDER — SODIUM CHLORIDE 0.9 % IV BOLUS (SEPSIS)
1000.0000 mL | Freq: Once | INTRAVENOUS | Status: AC
  Administered 2016-12-14: 1000 mL via INTRAVENOUS

## 2016-12-14 MED ORDER — SODIUM CHLORIDE 0.9 % IV BOLUS (SEPSIS)
500.0000 mL | Freq: Once | INTRAVENOUS | Status: AC
  Administered 2016-12-14: 500 mL via INTRAVENOUS

## 2016-12-14 NOTE — Discharge Instructions (Signed)
Please follow up with your PCP regarding today's visit. Please take metformin and glipizide as prescribed until you see your primary care physician who will continue to monitor. Very important that you eat attached diabetic diet and exercise regularly.   Get help right away if: You have difficulty breathing. You have a change in how you think, feel, or act (mental status). You have nausea or vomiting that does not go away.

## 2016-12-14 NOTE — ED Provider Notes (Signed)
[facility]-EMER[facility]ENCY DEPT Provider Note   CSN: [id number] Arrival date & time: 12/14/16  0759     History   Chief Complaint Chief Complaint  Patient presents with  . Hyperglycemia    HPI Shawn Hughes is a 51 y.o. male with history of CAD, ACS with stent placement in October presenting to the ED with complaints of "hyperglycemia" and right sided abdominal pain since 5 days ago. Patient reports his right sided abdominal pain is worse with food, aching, and currently a 2/10 pain. He states yesterday was about an 7 or 8/10. He endorses associated SOB when the pain is more severe, nausea, vomiting 4 times yesterday but none today and sweats. He states that he had a heart attack August 30, 2016 and was given a stent. He was part of a study for his medication associated with his stent and states he has not taken any medication including his daily medications since beginning of November because of side effects. Patient states that he has noticed having blurry vision, dizziness, polyuria, polyphagia, foul "sweet and sour" smelling urine and weight loss since right after his heart attack in October. He states he has a PCP but has not seen his PCP since before his heart attack and all of his symptoms started. He states he is scheduled to see his cardiologist for 3 month follow up of stent placement in a week. Patient admits to being able to tolerate solids and liquids. Patient denies chest pain, SOB right now, changes in bowel movements, fever, chills, difficulty urinating, dysuria.   The history is provided by the patient and the spouse. No language interpreter was used.  Hyperglycemia  Associated symptoms: abdominal pain (Right side), diaphoresis, dizziness, nausea, polyuria, shortness of breath (secondary to severe abdominal pain) and vomiting   Associated symptoms: no chest pain, no dysuria, no fever and no weakness     Past Medical History:  Diagnosis Date  . CAD (coronary artery disease)    a.  08/2016: cath showing 40% prox RCA, 10% LCx, 20% LAD and 99% RI stenosis. DES to RI.  Marland Kitchen GERD (gastroesophageal reflux disease)   . History of nephrolithiasis   . HLD (hyperlipidemia)   . Tobacco use     Patient Active Problem List   Diagnosis Date Noted  . Tobacco use 09/01/2016  . ACS (acute coronary syndrome) (HCC) 08/30/2016  . NSTEMI (non-ST elevated myocardial infarction) (HCC)   . Loose stools 09/02/2013  . GERD (gastroesophageal reflux disease) 09/02/2013  . Chest pain 01/09/2012  . WEIGHT LOSS 08/03/2010  . NAUSEA WITH VOMITING 08/03/2010  . CHANGE IN BOWELS 08/03/2010  . EPIGASTRIC PAIN 08/03/2010    Past Surgical History:  Procedure Laterality Date  . CARDIAC CATHETERIZATION N/A 08/30/2016   Procedure: Left Heart Cath and Coronary Angiography;  Surgeon: Kathleene Hazel, MD;  Location: [facility] INVASIVE CV LAB;  Service: Cardiovascular;  Laterality: N/A;  . CARDIAC CATHETERIZATION N/A 08/30/2016   Procedure: Coronary Stent Intervention;  Surgeon: Kathleene Hazel, MD;  Location: [facility] INVASIVE CV LAB;  Service: Cardiovascular;  Laterality: N/A;  . ESOPHAGOGASTRODUODENOSCOPY  01/10/2012   WUJ:[id number] in the distal esophagus2O TO GERD/Mild gastritis  . LACERATION REPAIR     Right arm       Home Medications    Prior to Admission medications   Medication Sig Start Date End Date Taking? Authorizing Provider  nitroGLYCERIN (NITROSTAT) 0.4 MG SL tablet Place 1 tablet (0.4 mg total) under the tongue every 5 (five) minutes x 3 doses  as needed for chest pain. 09/01/16  Yes Ellsworth LennoxBrittany M Strader, PA  AMBULATORY NON FORMULARY MEDICATION Take 90 mg by mouth 2 (two) times daily. Medication Name: Brilinta 90 mg BID (TWILIGHT Research study PROVIDED) Patient not taking: Reported on 12/14/2016 08/31/16   Kathleene Hazelhristopher D McAlhany, MD  AMBULATORY NON FORMULARY MEDICATION Take 81 mg by mouth daily. Medication Name: Aspirin 81 mg Daily (TWILIGHT Research Study Provided) Patient not taking:  Reported on 12/14/2016 08/31/16   Kathleene Hazelhristopher D McAlhany, MD  atorvastatin (LIPITOR) 80 MG tablet Take 1 tablet (80 mg total) by mouth daily at 6 PM. Patient not taking: Reported on 12/14/2016 09/01/16   Ellsworth LennoxBrittany M Strader, PA  glipiZIDE (GLUCOTROL) 5 MG tablet Take 1 tablet (5 mg total) by mouth daily before breakfast. 12/14/16   Lucita LoraFrancisco Manuel [place], [place]  metFORMIN (GLUCOPHAGE) 500 MG tablet Take 1 tablet (500 mg total) by mouth 2 (two) times daily with a meal. 12/14/16 01/13/17  Aaric Dolph Manuel [place]  metoprolol tartrate (LOPRESSOR) 25 MG tablet Take 1 tablet (25 mg total) by mouth 2 (two) times daily. Patient not taking: Reported on 12/14/2016 09/01/16   Ellsworth LennoxBrittany M Strader, PA    Family History Family History  Problem Relation Age of Onset  . CAD Mother 6660  . CAD Father 558  . Hypertension Brother   . Colon cancer Neg Hx   . Colon polyps Neg Hx   . Esophageal cancer Neg Hx   . Pancreatic cancer Neg Hx   . Stomach cancer Neg Hx     Social History Social History  Substance Use Topics  . Smoking status: Former Smoker    Packs/day: 0.00    Years: 15.00    Types: Cigarettes    Start date: 09/11/1983    Quit date: 08/30/2016  . Smokeless tobacco: Never Used  . Alcohol use 1.8 oz/week    3 Cans of beer per week     Comment: daily     Allergies   Hydrocodone   Review of Systems Review of Systems  Constitutional: Positive for diaphoresis and unexpected weight change. Negative for chills and fever.  Eyes:       Blurry vision  Respiratory: Positive for shortness of breath (secondary to severe abdominal pain).   Cardiovascular: Negative for chest pain.  Gastrointestinal: Positive for abdominal pain (Right side), nausea and vomiting. Negative for constipation and diarrhea.  Endocrine: Positive for polyphagia and polyuria.  Genitourinary: Negative for difficulty urinating and dysuria.  Musculoskeletal: Negative for back pain, neck pain and neck stiffness.  Skin: Negative for  rash and wound.  Neurological: Positive for dizziness. Negative for speech difficulty, weakness and numbness.  Psychiatric/Behavioral: Positive for agitation.     Physical Exam Updated Vital Signs BP 104/72 (BP Location: Right Arm)   Pulse 76   Temp 97.6 F (36.4 C) (Oral)   Resp 16   Ht 5\' 9"  (1.753 m)   Wt 90.7 kg   SpO2 98%   BMI 29.53 kg/m   Physical Exam  Constitutional: He is oriented to person, place, and time. He appears well-developed and well-nourished.  Well appearing.  HENT:  Head: Normocephalic and atraumatic.  Nose: Nose normal.  Mouth/Throat: Oropharynx is clear and moist.  Eyes: Conjunctivae and EOM are normal. Pupils are equal, round, and reactive to light.  Neck: Normal range of motion. Neck supple.  Cardiovascular: Normal rate and normal heart sounds.   Pulmonary/Chest: Effort normal and breath sounds normal. No respiratory distress. He exhibits no tenderness.  Abdominal:  Soft. Bowel sounds are normal. He exhibits no pulsatile midline mass. There is tenderness. There is guarding and positive Murphy's sign. There is no rebound and no tenderness at McBurney's point.  Musculoskeletal: Normal range of motion.  Neurological: He is alert and oriented to person, place, and time.  Cranial Nerves:  III,IV, VI: ptosis not present, extra-ocular movements intact bilaterally, direct and consensual pupillary light reflexes intact bilaterally V: facial sensation, jaw opening, and bite strength equal bilaterally VII: eyebrow raise, eyelid close, smile, frown, pucker equal bilaterally VIII: hearing grossly normal bilaterally  IX,X: palate elevation and swallowing intact XI: bilateral shoulder shrug and lateral head rotation equal and strong XII: midline tongue extension  Cerebellar tests negative.  Sensory intact.  Muscle strength 5/5 Patient able to ambulate without difficulty.   Skin: Skin is warm. Capillary refill takes less than 2 seconds.  Psychiatric: He has a  normal mood and affect. His behavior is normal.  Nursing note and vitals reviewed.    ED Treatments / Results  Labs (all labs ordered are listed, but only abnormal results are displayed) Labs Reviewed  COMPREHENSIVE METABOLIC PANEL - Abnormal; Notable for the following:       Result Value   Glucose, Bld 376 (*)    All other components within normal limits  URINALYSIS, ROUTINE W REFLEX MICROSCOPIC - Abnormal; Notable for the following:    Glucose, UA >=500 (*)    Ketones, ur 15 (*)    All other components within normal limits  URINALYSIS, MICROSCOPIC (REFLEX) - Abnormal; Notable for the following:    Bacteria, UA RARE (*)    Squamous Epithelial / LPF 0-5 (*)    All other components within normal limits  CBG MONITORING, ED - Abnormal; Notable for the following:    Glucose-Capillary 392 (*)    All other components within normal limits  CBG MONITORING, ED - Abnormal; Notable for the following:    Glucose-Capillary 222 (*)    All other components within normal limits  LIPASE, BLOOD  CBC  HEMOGLOBIN A1C    EKG  EKG Interpretation None       Radiology No results found.  Procedures Procedures (including critical care time)  Medications Ordered in ED Medications  sodium chloride 0.9 % bolus 1,000 mL (0 mLs Intravenous Stopped 12/14/16 1009)  sodium chloride 0.9 % bolus 500 mL (0 mLs Intravenous Stopped 12/14/16 1216)  insulin aspart (novoLOG) injection 10 Units (10 Units Subcutaneous Given 12/14/16 1008)     Initial Impression / Assessment and Plan / ED Course  I have reviewed the triage vital signs and the nursing notes.  Pertinent labs & imaging results that were available during my care of the patient were reviewed by me and considered in my medical decision making (see chart for details).      Pt is a 51 yo male with no prior history of diabetes presenting with vision changes, dizziness, sweating, and RLQ pain. On exam, pt in NAD. Nontoxic/nonseptic appearing. VSS.  Afebrile. Lungs CTA. Heart RRR. Abdomen soft. Abdomen soft and tender to RUQ. Positive murphy's sign but states pain is a 3/10 during exam. POC glucose at 392. CBC unremarkable. Potassium 4.1. Gap 9. Presence of glucose in urine. Given NS bolus and 10 U insulin.   Upon reassessment pt feeling overall better and abdomen feeling better at no pain. Abdomen still mildly tender to palpation at RUQ but is now a 2/10 on palpation from about a 3/10 pain on initial evaluation. He states he  noticed his urine doesn't smell as foul as it was previously. Still no SOB or chest pain. Plan is to discharge with strict instructions to follow up with primary care physician on Monday to monitor and treat new-onset of diabetes. At time of discharge, Patient is in no acute distress. Vital Signs are stable. Patient is able to ambulate. Patient able to tolerate PO.   Final Clinical Impressions(s) / ED Diagnoses   Final diagnoses:  Hyperglycemia    New Prescriptions New Prescriptions   GLIPIZIDE (GLUCOTROL) 5 MG TABLET    Take 1 tablet (5 mg total) by mouth daily before breakfast.   METFORMIN (GLUCOPHAGE) 500 MG TABLET    Take 1 tablet (500 mg total) by mouth 2 (two) times daily with a meal.     [address] [place], [place] 12/14/16 1655    Cathren Laine, MD 12/15/16 1009

## 2016-12-14 NOTE — ED Triage Notes (Signed)
Pt stated that Tuesday evening he started having vision changes, dizziness, sweating along with RLQ pain. Nausea and vomiting 4 times since yesterday. Pt states this happened before in October. Pt stopped all meds before thanksgiving.

## 2016-12-16 ENCOUNTER — Ambulatory Visit (INDEPENDENT_AMBULATORY_CARE_PROVIDER_SITE_OTHER): Payer: PRIVATE HEALTH INSURANCE | Admitting: Cardiology

## 2016-12-16 ENCOUNTER — Encounter: Payer: Self-pay | Admitting: Cardiology

## 2016-12-16 VITALS — BP 124/84 | HR 102 | Ht 69.0 in | Wt 191.0 lb

## 2016-12-16 DIAGNOSIS — E1165 Type 2 diabetes mellitus with hyperglycemia: Secondary | ICD-10-CM

## 2016-12-16 DIAGNOSIS — I25810 Atherosclerosis of coronary artery bypass graft(s) without angina pectoris: Secondary | ICD-10-CM | POA: Diagnosis not present

## 2016-12-16 DIAGNOSIS — E1159 Type 2 diabetes mellitus with other circulatory complications: Secondary | ICD-10-CM | POA: Diagnosis not present

## 2016-12-16 DIAGNOSIS — F17201 Nicotine dependence, unspecified, in remission: Secondary | ICD-10-CM | POA: Diagnosis not present

## 2016-12-16 DIAGNOSIS — IMO0002 Reserved for concepts with insufficient information to code with codable children: Secondary | ICD-10-CM

## 2016-12-16 DIAGNOSIS — E782 Mixed hyperlipidemia: Secondary | ICD-10-CM | POA: Diagnosis not present

## 2016-12-16 LAB — HEMOGLOBIN A1C
Hgb A1c MFr Bld: 13.9 % — ABNORMAL HIGH (ref 4.8–5.6)
Mean Plasma Glucose: 352 mg/dL

## 2016-12-16 MED ORDER — METOPROLOL SUCCINATE ER 25 MG PO TB24: 25.0000 mg | ORAL_TABLET | Freq: Every day | ORAL | 3 refills | Status: DC

## 2016-12-16 MED ORDER — TICAGRELOR 90 MG PO TABS: 90.0000 mg | ORAL_TABLET | Freq: Two times a day (BID) | ORAL | 3 refills | Status: DC

## 2016-12-16 MED ORDER — CLOPIDOGREL BISULFATE 75 MG PO TABS: 75.0000 mg | ORAL_TABLET | Freq: Every day | ORAL | 3 refills | Status: DC

## 2016-12-16 MED ORDER — ROSUVASTATIN CALCIUM 5 MG PO TABS: 5.0000 mg | ORAL_TABLET | Freq: Every day | ORAL | 3 refills | Status: DC

## 2016-12-16 NOTE — Patient Instructions (Signed)
Your physician wants you to follow-up in:  4 months Dr Randa SpikeMcDowell You will receive a reminder letter in the mail two months in advance. If you don't receive a letter, please call our office to schedule the follow-up appointment.     Take Aspirin 81 mg daily  Take Crestor 5 mg at dinner  Take Plavix 75 mg daily   Take Toprol XL 25 mg daily  Get alb work FASTING just before next visit       Thank you for choosing [facility] !

## 2016-12-16 NOTE — Progress Notes (Signed)
Cardiology Office Note  Date: 12/16/2016   ID: JAYANT KRIZ, DOB 1966-07-11, MRN [id number]  PCP: [facility]  Primary Cardiologist: Nona Dell, MD   Chief Complaint  Patient presents with  . Coronary Artery Disease    History of Present Illness: Shawn Hughes is a 51 y.o. male last seen in October 2017 by Ms. Lawrence NP, this is my first visit with him in the office. He has a history of CAD as outlined below. He presents for follow-up, was seen in the ER recently with uncontrolled glucose. He has not had a recent PCP visit. From a cardiac perspective he does not report angina symptoms. In reviewing his medications it seems that he took himself off Brilinta due to shortness of breath which subsequently improved. He has not followed up with the Twilight study. Also no longer taking Lopressor or Lipitor. He reports intolerance to Lipitor.  Today I reviewed his medications and the importance of broadening his medical regimen including resumption of DAPT. He has agreed to start Plavix. Also recommending that he try Crestor instead of Lipitor, and we will also put him on Toprol-XL.  I have encouraged him to follow-up with his PCP to get blood sugars under better control.  Past Medical History:  Diagnosis Date  . CAD (coronary artery disease)    a. 08/2016: cath showing 40% prox RCA, 10% LCx, 20% LAD and 99% RI stenosis. DES to RI.  Marland Kitchen GERD (gastroesophageal reflux disease)   . History of nephrolithiasis   . HLD (hyperlipidemia)   . Type 2 diabetes mellitus (HCC)     Past Surgical History:  Procedure Laterality Date  . CARDIAC CATHETERIZATION N/A 08/30/2016   Procedure: Left Heart Cath and Coronary Angiography;  Surgeon: Kathleene Hazel, MD;  Location: [facility] INVASIVE CV LAB;  Service: Cardiovascular;  Laterality: N/A;  . CARDIAC CATHETERIZATION N/A 08/30/2016   Procedure: Coronary Stent Intervention;  Surgeon: Kathleene Hazel, MD;  Location:  [facility] INVASIVE CV LAB;  Service: Cardiovascular;  Laterality: N/A;  . ESOPHAGOGASTRODUODENOSCOPY  01/10/2012   WUJ:[id number] in the distal esophagus2O TO GERD/Mild gastritis  . LACERATION REPAIR     Right arm    Current Outpatient Prescriptions  Medication Sig Dispense Refill  . aspirin EC 81 MG tablet Take 81 mg by mouth daily.    Marland Kitchen glipiZIDE (GLUCOTROL) 5 MG tablet Take 1 tablet (5 mg total) by mouth daily before breakfast. 30 tablet 0  . metFORMIN (GLUCOPHAGE) 500 MG tablet Take 1 tablet (500 mg total) by mouth 2 (two) times daily with a meal. 60 tablet 0  . nitroGLYCERIN (NITROSTAT) 0.4 MG SL tablet Place 1 tablet (0.4 mg total) under the tongue every 5 (five) minutes x 3 doses as needed for chest pain. 25 tablet 2  . clopidogrel (PLAVIX) 75 MG tablet Take 1 tablet (75 mg total) by mouth daily. 90 tablet 3  . metoprolol succinate (TOPROL XL) 25 MG 24 hr tablet Take 1 tablet (25 mg total) by mouth daily. 90 tablet 3  . rosuvastatin (CRESTOR) 5 MG tablet Take 1 tablet (5 mg total) by mouth daily. 90 tablet 3   No current facility-administered medications for this visit.    Allergies:  Hydrocodone   Social History: The patient  reports that he quit smoking about 3 months ago. His smoking use included Cigarettes. He started smoking about 33 years ago. He smoked 0.00 packs per day for 15.00 years. He has never used smokeless tobacco. He reports  that he drinks about 1.8 oz of alcohol per week . He reports that he does not use drugs.   ROS:  Please see the history of present illness. Otherwise, complete review of systems is positive for none.  All other systems are reviewed and negative.   Physical Exam: VS:  BP 124/84   Pulse (!) 102   Ht 5\' 9"  (1.753 m)   Wt 191 lb (86.6 kg)   SpO2 97%   BMI 28.21 kg/m , BMI Body mass index is 28.21 kg/m.  Wt Readings from Last 3 Encounters:  12/16/16 191 lb (86.6 kg)  12/14/16 200 lb (90.7 kg)  12/13/16 200 lb (90.7 kg)    General: Patient appears  comfortable at rest. HEENT: Conjunctiva and lids normal, oropharynx clear. Neck: Supple, no elevated JVP or carotid bruits, no thyromegaly. Lungs: Clear to auscultation, nonlabored breathing at rest. Cardiac: Regular rate and rhythm, no S3 or significant systolic murmur, no pericardial rub. Abdomen: Soft, nontender, bowel sounds present, no guarding or rebound. Extremities: No pitting edema, distal pulses 2+. Skin: Warm and dry. Musculoskeletal: No kyphosis. Neuropsychiatric: Alert and oriented x3, affect grossly appropriate.  ECG: I personally reviewed the tracing from 09/02/2016 which shows sinus rhythm with anterior ST depression, increased voltage.  Recent Labwork: 12/14/2016: ALT 27; AST 20; BUN 10; Creatinine, Ser 0.66; Hemoglobin 15.3; Platelets 272; Potassium 4.1; Sodium 135     Component Value Date/Time   CHOL 215 (H) 08/31/2016 0413   TRIG 99 08/31/2016 0413   HDL 30 (L) 08/31/2016 0413   CHOLHDL 7.2 08/31/2016 0413   VLDL 20 08/31/2016 0413   LDLCALC 165 (H) 08/31/2016 0413    Other Studies Reviewed Today:  Echocardiogram 08/31/2016: Study Conclusions  Cardiac catheterization 08/30/2016: - Left ventricle: The cavity size was normal. Systolic function was   normal. The estimated ejection fraction was in the range of 55%   to 60%.   Prox RCA lesion, 40 %stenosed.  Mid Cx lesion, 10 %stenosed.  Prox LAD to Mid LAD lesion, 20 %stenosed.  A STENT PROMUS PREM MR 2.5X16 drug eluting stent was successfully placed.  Ramus lesion, 99 %stenosed.  Post intervention, there is a 0% residual stenosis.  There is mild left ventricular systolic dysfunction.  LV end diastolic pressure is mildly elevated.  The left ventricular ejection fraction is 50-55% by visual estimate.  There is no mitral valve regurgitation.   1. NSTEMI secondary to severe, ulcerated stenosis Ramus Intermediate branch 2. Successful PTCA/DES x 1 Ramus Intermediate branch 3. Mild non-obstructive  disease in the LAD and RCA 4. The overall LV systolic function is preserved but there is hypokinesis of the anterolateral wall.   Assessment and Plan:  1. CAD status post NSTEMI in October 2017 with placement of DES to the ramus intermedius and otherwise nonobstructive disease in the LAD and RCA. He is not reporting any angina symptoms at this time. As per above discussion plan is to put him on combination of aspirin and Plavix at this point due to prior intolerance to Brilinta, start Toprol-XL 25 mg daily, and resume statin therapy in the form of Crestor. We will arrange follow-up.  2. Hyperlipidemia, LDL was 165 in October 2017. He stopped Lipitor reporting intolerances, felt achy and weak. We will try Crestor 5 mg daily. Follow-up FLP and LFTs for his next visit.  3. Type 2 diabetes mellitus, on Glucotrol and Glucophage. I encouraged him to make a follow-up visit with his PCP for further management.  4. Tobacco abuse  in remission.  Current medicines were reviewed with the patient today.  No orders of the defined types were placed in this encounter.   Disposition:  Signed, Jonelle Sidle, MD, [facility] 12/16/2016 1:51 PM    [facility] [address], [place], [place] [address] Phone: [phone]; Fax: [phone]

## 2016-12-16 NOTE — Telephone Encounter (Signed)
Left message for pt to return call about brilinta. Sent in RX for Brilinta.

## 2016-12-16 NOTE — Addendum Note (Signed)
Addended by: Abelino DerrickMCGHEE, Mariea Mcmartin R on: 12/16/2016 08:51 AM   Modules accepted: Orders

## 2016-12-23 ENCOUNTER — Telehealth (HOSPITAL_COMMUNITY): Payer: Self-pay | Admitting: *Deleted

## 2016-12-23 NOTE — Telephone Encounter (Signed)
left voice message regarding an appointment. 

## 2018-04-07 ENCOUNTER — Telehealth: Payer: Self-pay | Admitting: *Deleted

## 2018-04-07 NOTE — Telephone Encounter (Signed)
TWILIGHT Research study vital status phone call made to patient. Patient answered phone and I explained who I was and following up about DAPT therapy , patient states he no longer on any medication. I thanked him for answering the phone and encouraged him to follow up with a cardiologist.

## 2018-05-24 ENCOUNTER — Encounter (HOSPITAL_COMMUNITY): Payer: Self-pay | Admitting: Emergency Medicine

## 2018-05-24 ENCOUNTER — Emergency Department (HOSPITAL_COMMUNITY): Payer: PRIVATE HEALTH INSURANCE

## 2018-05-24 ENCOUNTER — Emergency Department (HOSPITAL_COMMUNITY)
Admission: EM | Admit: 2018-05-24 | Discharge: 2018-05-24 | Disposition: A | Discharge status: Home / Self Care | Payer: PRIVATE HEALTH INSURANCE | Attending: Emergency Medicine | Admitting: Emergency Medicine

## 2018-05-24 ENCOUNTER — Other Ambulatory Visit: Payer: Self-pay

## 2018-05-24 DIAGNOSIS — I252 Old myocardial infarction: Secondary | ICD-10-CM | POA: Diagnosis not present

## 2018-05-24 DIAGNOSIS — E119 Type 2 diabetes mellitus without complications: Secondary | ICD-10-CM | POA: Diagnosis not present

## 2018-05-24 DIAGNOSIS — I2510 Atherosclerotic heart disease of native coronary artery without angina pectoris: Secondary | ICD-10-CM | POA: Diagnosis not present

## 2018-05-24 DIAGNOSIS — Y999 Unspecified external cause status: Secondary | ICD-10-CM | POA: Insufficient documentation

## 2018-05-24 DIAGNOSIS — S42202A Unspecified fracture of upper end of left humerus, initial encounter for closed fracture: Secondary | ICD-10-CM | POA: Diagnosis not present

## 2018-05-24 DIAGNOSIS — S42292A Other displaced fracture of upper end of left humerus, initial encounter for closed fracture: Secondary | ICD-10-CM

## 2018-05-24 DIAGNOSIS — Y939 Activity, unspecified: Secondary | ICD-10-CM | POA: Diagnosis not present

## 2018-05-24 DIAGNOSIS — T07XXXA Unspecified multiple injuries, initial encounter: Secondary | ICD-10-CM

## 2018-05-24 DIAGNOSIS — T1490XA Injury, unspecified, initial encounter: Secondary | ICD-10-CM

## 2018-05-24 DIAGNOSIS — S4992XA Unspecified injury of left shoulder and upper arm, initial encounter: Secondary | ICD-10-CM | POA: Diagnosis present

## 2018-05-24 DIAGNOSIS — Z87891 Personal history of nicotine dependence: Secondary | ICD-10-CM | POA: Insufficient documentation

## 2018-05-24 DIAGNOSIS — Y929 Unspecified place or not applicable: Secondary | ICD-10-CM | POA: Diagnosis not present

## 2018-05-24 MED ORDER — LORAZEPAM 1 MG PO TABS
1.0000 mg | ORAL_TABLET | Freq: Once | ORAL | Status: AC
  Administered 2018-05-24: 1 mg via ORAL
  Filled 2018-05-24: qty 1

## 2018-05-24 MED ORDER — IBUPROFEN 400 MG PO TABS
600.0000 mg | ORAL_TABLET | Freq: Once | ORAL | Status: AC
  Administered 2018-05-24: 600 mg via ORAL
  Filled 2018-05-24: qty 2

## 2018-05-24 MED ORDER — OXYCODONE-ACETAMINOPHEN 5-325 MG PO TABS: 2.0000 | ORAL_TABLET | ORAL | 0 refills | Status: DC | PRN

## 2018-05-24 MED ORDER — OXYCODONE-ACETAMINOPHEN 5-325 MG PO TABS: 1.0000 | ORAL_TABLET | ORAL | 0 refills | Status: DC | PRN

## 2018-05-24 MED ORDER — LORAZEPAM 1 MG PO TABS: 1.0000 mg | ORAL_TABLET | Freq: Two times a day (BID) | ORAL | 0 refills | Status: DC | PRN
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
## 2018-05-24 NOTE — ED Notes (Signed)
Cms intact to left hand,
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
## 2019-04-27 ENCOUNTER — Ambulatory Visit: Payer: PRIVATE HEALTH INSURANCE | Admitting: Orthopaedic Surgery

## 2019-04-27 ENCOUNTER — Ambulatory Visit (INDEPENDENT_AMBULATORY_CARE_PROVIDER_SITE_OTHER): Payer: PRIVATE HEALTH INSURANCE

## 2019-04-27 ENCOUNTER — Other Ambulatory Visit: Payer: Self-pay

## 2019-04-27 ENCOUNTER — Encounter: Payer: Self-pay | Admitting: Orthopaedic Surgery

## 2019-04-27 VITALS — BP 128/91 | HR 100 | Temp 97.2°F | Ht 69.0 in | Wt 195.0 lb

## 2019-04-27 DIAGNOSIS — M25561 Pain in right knee: Secondary | ICD-10-CM | POA: Diagnosis not present

## 2019-04-27 MED ORDER — NAPROXEN 500 MG PO TABS: 500.0000 mg | ORAL_TABLET | Freq: Two times a day (BID) | ORAL | 5 refills | Status: DC

## 2019-04-27 NOTE — Progress Notes (Signed)
Subjective:    Patient ID: Shawn Hughes, [birth year]KAIL[id number], 53 y.o.   MRN: [id number]  HPI He has had right knee pain for over a month getting worse.  He has medial joint pain.  He has swelling and popping and more recently giving way.  He has no trauma.  He has tried ibuprofen, ice, heat with little help.  He has some left knee pain at times bur more on the right.   Review of Systems  Constitutional: Positive for activity change.  Musculoskeletal: Positive for arthralgias, gait problem and joint swelling.  All other systems reviewed and are negative.  For Review of Systems, all other systems reviewed and are negative.  The following is a summary of the past history medically, past history surgically, known current medicines, social history and family history.  This information is gathered electronically by the computer from prior information and documentation.  I review this each visit and have found including this information at this point in the chart is beneficial and informative.   Past Medical History:  Diagnosis Date  . CAD (coronary artery disease)    a. 08/2016: cath showing 40% prox RCA, 10% LCx, 20% LAD and 99% RI stenosis. DES to RI.  Marland Kitchen GERD (gastroesophageal reflux disease)   . History of nephrolithiasis   . HLD (hyperlipidemia)   . Type 2 diabetes mellitus (HCC)     Past Surgical History:  Procedure Laterality Date  . CARDIAC CATHETERIZATION N/A 08/30/2016   Procedure: Left Heart Cath and Coronary Angiography;  Surgeon: Kathleene Hazel, MD;  Location: [facility] INVASIVE CV LAB;  Service: Cardiovascular;  Laterality: N/A;  . CARDIAC CATHETERIZATION N/A 08/30/2016   Procedure: Coronary Stent Intervention;  Surgeon: Kathleene Hazel, MD;  Location: [facility] INVASIVE CV LAB;  Service: Cardiovascular;  Laterality: N/A;  . ESOPHAGOGASTRODUODENOSCOPY  01/10/2012   XBM:[id number] in the distal esophagus2O TO GERD/Mild gastritis  . LACERATION REPAIR     Right arm    Current Outpatient Medications on File Prior to Visit  Medication Sig Dispense Refill  . glyBURIDE (DIABETA) 5 MG tablet TAKE 2 TABLETS WITH BREAKFAST OR THE FIRST MAIN MEAL OF THE DAY    . valsartan (DIOVAN) 160 MG tablet     . LORazepam (ATIVAN) 1 MG tablet Take 1 tablet (1 mg total) by mouth 2 (two) times daily as needed (muscle spasm). (Patient not taking: Reported on 04/27/2019) 10 tablet 0  . oxyCODONE-acetaminophen (PERCOCET/ROXICET) 5-325 MG tablet Take 1-2 tablets by mouth every 4 (four) hours as needed. (Patient not taking: Reported on 04/27/2019) 20 tablet 0  . oxyCODONE-acetaminophen (PERCOCET/ROXICET) 5-325 MG tablet Take 2 tablets by mouth every 4 (four) hours as needed for severe pain. (Patient not taking: Reported on 04/27/2019) 6 tablet 0   No current facility-administered medications on file prior to visit.     Social History   Socioeconomic History  . Marital status: Married    Spouse name: Not on file  . Number of children: Not on file  . Years of education: Not on file  . Highest education level: Not on file  Occupational History  . Not on file  Social Needs  . Financial resource strain: Not on file  . Food insecurity:    Worry: Not on file    Inability: Not on file  . Transportation needs:    Medical: Not on file    Non-medical: Not on file  Tobacco Use  . Smoking status: Former Smoker  Packs/day: 0.00    Years: 15.00    Pack years: 0.00    Types: Cigarettes    Start date: 09/11/1983    Last attempt to quit: 08/30/2016    Years since quitting: 2.6  . Smokeless tobacco: Never Used  Substance and Sexual Activity  . Alcohol use: Yes    Comment: occasional  . Drug use: No  . Sexual activity: Yes  Lifestyle  . Physical activity:    Days per week: Not on file    Minutes per session: Not on file  . Stress: Not on file  Relationships  . Social connections:    Talks on phone: Not on file    Gets together: Not on file    Attends religious service: Not on  file    Active member of club or organization: Not on file    Attends meetings of clubs or organizations: Not on file    Relationship status: Not on file  . Intimate partner violence:    Fear of current or ex partner: Not on file    Emotionally abused: Not on file    Physically abused: Not on file    Forced sexual activity: Not on file  Other Topics Concern  . Not on file  Social History Narrative  . Not on file    Family History  Problem Relation Age of Onset  . CAD Mother 44  . CAD Father 31  . Hypertension Brother   . Colon cancer Neg Hx   . Colon polyps Neg Hx   . Esophageal cancer Neg Hx   . Pancreatic cancer Neg Hx   . Stomach cancer Neg Hx     BP (!) 128/91   Pulse 100   Temp (!) 97.2 F (36.2 C)   Ht 5\' 9"  (1.753 m)   Wt 195 lb (88.5 kg)   BMI 28.80 kg/m   Body mass index is 28.8 kg/m.     Objective:   Physical Exam Vitals signs reviewed.  Constitutional:      Appearance: He is well-developed.  HENT:     Head: Normocephalic and atraumatic.  Eyes:     Conjunctiva/sclera: Conjunctivae normal.     Pupils: Pupils are equal, round, and reactive to light.  Neck:     Musculoskeletal: Normal range of motion and neck supple.  Cardiovascular:     Rate and Rhythm: Normal rate and regular rhythm.  Pulmonary:     Effort: Pulmonary effort is normal.  Abdominal:     Palpations: Abdomen is soft.  Musculoskeletal:     Right knee: Tenderness found. Medial joint line tenderness noted.       Legs:  Skin:    General: Skin is warm and dry.  Neurological:     Mental Status: He is alert and oriented to person, place, and time.     Cranial Nerves: No cranial nerve deficit.     Motor: No abnormal muscle tone.     Coordination: Coordination normal.     Deep Tendon Reflexes: Reflexes are normal and symmetric. Reflexes normal.  Psychiatric:        Behavior: Behavior normal.        Thought Content: Thought content normal.        Judgment: Judgment normal.       X-rays were done of the right knee, reported separately.     Assessment & Plan:   Encounter Diagnosis  Name Primary?  . Acute pain of right knee Yes   I  am concerned about medial meniscus tear.  He may need MRI.  PROCEDURE NOTE:  The patient requests injections of the right knee , verbal consent was obtained.  The right knee was prepped appropriately after time out was performed.   Sterile technique was observed and injection of 1 cc of Depo-Medrol 40 mg with several cc's of plain xylocaine. Anesthesia was provided by ethyl chloride and a 20-gauge needle was used to inject the knee area. The injection was tolerated well.  A band aid dressing was applied.  The patient was advised to apply ice later today and tomorrow to the injection sight as needed.  Begin naprosyn 500 po bid pc.  Return in two weeks.  Call if any problem.  Precautions discussed.   Electronically Signed Darreld McleanWayne Adden Strout, MD 6/2/20209:00 AM

## 2019-05-11 ENCOUNTER — Other Ambulatory Visit: Payer: Self-pay

## 2019-05-11 ENCOUNTER — Encounter: Payer: Self-pay | Admitting: Orthopaedic Surgery

## 2019-05-11 ENCOUNTER — Ambulatory Visit (INDEPENDENT_AMBULATORY_CARE_PROVIDER_SITE_OTHER): Payer: PRIVATE HEALTH INSURANCE | Admitting: Orthopaedic Surgery

## 2019-05-11 DIAGNOSIS — I10 Essential (primary) hypertension: Secondary | ICD-10-CM | POA: Insufficient documentation

## 2019-05-11 DIAGNOSIS — G8929 Other chronic pain: Secondary | ICD-10-CM | POA: Diagnosis not present

## 2019-05-11 DIAGNOSIS — M25561 Pain in right knee: Secondary | ICD-10-CM

## 2019-05-11 DIAGNOSIS — E119 Type 2 diabetes mellitus without complications: Secondary | ICD-10-CM | POA: Insufficient documentation

## 2019-05-11 DIAGNOSIS — E1159 Type 2 diabetes mellitus with other circulatory complications: Secondary | ICD-10-CM | POA: Insufficient documentation

## 2019-05-11 MED ORDER — HYDROCODONE-ACETAMINOPHEN 5-325 MG PO TABS: ORAL_TABLET | ORAL | 0 refills | Status: DC

## 2019-05-11 NOTE — Progress Notes (Signed)
Patient Shawn Hughes, male DOB:[birth year], 53 y.o. RKY:[id number]  Chief Complaint  Patient presents with  . Knee Pain    right     HPI  Shawn Hughes is a 53 y.o. male who has continued pain of the right knee and giving way.  The injection helped only a little for a few days.  He has more pain on stairs and after squatting.  He has no new trauma.  I will order a MRI.  I am concerned about a medial meniscus tear.   Body mass index is 28.8 kg/m.  ROS  Review of Systems  Constitutional: Positive for activity change.  Musculoskeletal: Positive for arthralgias, gait problem and joint swelling.  All other systems reviewed and are negative.   All other systems reviewed and are negative.  The following is a summary of the past history medically, past history surgically, known current medicines, social history and family history.  This information is gathered electronically by the computer from prior information and documentation.  I review this each visit and have found including this information at this point in the chart is beneficial and informative.    Past Medical History:  Diagnosis Date  . CAD (coronary artery disease)    a. 08/2016: cath showing 40% prox RCA, 10% LCx, 20% LAD and 99% RI stenosis. DES to RI.  Marland Kitchen GERD (gastroesophageal reflux disease)   . History of nephrolithiasis   . HLD (hyperlipidemia)   . Type 2 diabetes mellitus ([place])     Past Surgical History:  Procedure Laterality Date  . CARDIAC CATHETERIZATION N/A 08/30/2016   Procedure: Left Heart Cath and Coronary Angiography;  Surgeon: Burnell Blanks, MD;  Location: [place] CV LAB;  Service: Cardiovascular;  Laterality: N/A;  . CARDIAC CATHETERIZATION N/A 08/30/2016   Procedure: Coronary Stent Intervention;  Surgeon: Burnell Blanks, MD;  Location: [place] CV LAB;  Service: Cardiovascular;  Laterality: N/A;  . ESOPHAGOGASTRODUODENOSCOPY  01/10/2012   [id number]:[id number] in the distal  esophagus2O TO GERD/Mild gastritis  . LACERATION REPAIR     Right arm    Family History  Problem Relation Age of Onset  . CAD Mother 68  . CAD Father 85  . Hypertension Brother   . Colon cancer Neg Hx   . Colon polyps Neg Hx   . Esophageal cancer Neg Hx   . Pancreatic cancer Neg Hx   . Stomach cancer Neg Hx     Social History Social History   Tobacco Use  . Smoking status: Former Smoker    Packs/day: 0.00    Years: 15.00    Pack years: 0.00    Types: Cigarettes    Start date: 09/11/1983    Quit date: 08/30/2016    Years since quitting: 2.6  . Smokeless tobacco: Never Used  Substance Use Topics  . Alcohol use: Yes    Comment: occasional  . Drug use: No    Allergies  Allergen Reactions  . Hydrocodone Itching  . Insulin Glargine     Current Outpatient Medications  Medication Sig Dispense Refill  . glyBURIDE (DIABETA) 5 MG tablet TAKE 2 TABLETS WITH BREAKFAST OR THE FIRST MAIN MEAL OF THE DAY    . LORazepam (ATIVAN) 1 MG tablet Take 1 tablet (1 mg total) by mouth 2 (two) times daily as needed (muscle spasm). (Patient not taking: Reported on 04/27/2019) 10 tablet 0  . naproxen (NAPROSYN) 500 MG tablet Take 1 tablet (500 mg total) by mouth 2 (two) times daily  with a meal. 60 tablet 5  . oxyCODONE-acetaminophen (PERCOCET/ROXICET) 5-325 MG tablet Take 1-2 tablets by mouth every 4 (four) hours as needed. (Patient not taking: Reported on 04/27/2019) 20 tablet 0  . oxyCODONE-acetaminophen (PERCOCET/ROXICET) 5-325 MG tablet Take 2 tablets by mouth every 4 (four) hours as needed for severe pain. (Patient not taking: Reported on 04/27/2019) 6 tablet 0  . valsartan (DIOVAN) 160 MG tablet      No current facility-administered medications for this visit.      Physical Exam  Blood pressure (!) 140/98, pulse (!) 108, temperature (!) 97.2 F (36.2 C), height 5\' 9"  (1.753 m), weight 195 lb (88.5 kg).  Constitutional: overall normal hygiene, normal nutrition, well developed, normal  grooming, normal body habitus. Assistive device:none  Musculoskeletal: gait and station Limp right, muscle tone and strength are normal, no tremors or atrophy is present.  .  Neurological: coordination overall normal.  Deep tendon reflex/nerve stretch intact.  Sensation normal.  Cranial nerves II-XII intact.   Skin:   Normal overall no scars, lesions, ulcers or rashes. No psoriasis.  Psychiatric: Alert and oriented x 3.  Recent memory intact, remote memory unclear.  Normal mood and affect. Well groomed.  Good eye contact.  Cardiovascular: overall no swelling, no varicosities, no edema bilaterally, normal temperatures of the legs and arms, no clubbing, cyanosis and good capillary refill.  Lymphatic: palpation is normal.  Right knee has slight effusion, pain medially, ROM 0 to 110, limp right, positive medial [place], [place] intact.  No distal edema is present.  All other systems reviewed and are negative   The patient has been educated about the nature of the problem(s) and counseled on treatment options.  The patient appeared to understand what I have discussed and is in agreement with it.  Encounter Diagnosis  Name Primary?  . Chronic pain of right knee     PLAN Call if any problems.  Precautions discussed.  Continue current medications.   Return to clinic after MRI.   I have reviewed the [place] Controlled Substance Reporting System web site prior to prescribing narcotic medicine for this patient.

## 2019-05-12 ENCOUNTER — Telehealth: Payer: Self-pay | Admitting: Orthopaedic Surgery

## 2019-05-12 NOTE — Telephone Encounter (Signed)
Noted.  He has taken this before and was on oxycodone also in the past.  We can send any records he needs to his new doctor.  He does need a MRI of the knee.

## 2019-05-12 NOTE — Telephone Encounter (Signed)
Patient called stating that he was prescribed Hydrocodone-Acetaminophen 5/325mg  and he is allergic to Hydrocodone. Patient also stated that we could stop anything we have ordered for him because he was going to find another doctor. I asked him what did we have scheduled for him and he stated we were trying to get his insurance to ok a MRI.

## 2019-05-19 ENCOUNTER — Telehealth: Payer: Self-pay | Admitting: Orthopaedic Surgery

## 2019-05-19 NOTE — Telephone Encounter (Signed)
Per voice message 05/18/2019 from [facility](Peggy) ph [phone], relays patient has decided to not pursue MRI at this time; seeking a 2nd opinion. Vickii Chafe states they are closing the referral. Note also entered into [facility] referral.

## 2019-09-20 ENCOUNTER — Encounter (HOSPITAL_COMMUNITY): Payer: Self-pay | Admitting: Emergency Medicine

## 2019-09-20 ENCOUNTER — Observation Stay (HOSPITAL_COMMUNITY)
Admission: EM | Admit: 2019-09-20 | Discharge: 2019-09-21 | Disposition: A | Discharge status: Home / Self Care | Payer: PRIVATE HEALTH INSURANCE | Attending: Family Medicine | Admitting: Family Medicine

## 2019-09-20 ENCOUNTER — Emergency Department (HOSPITAL_COMMUNITY): Payer: PRIVATE HEALTH INSURANCE

## 2019-09-20 ENCOUNTER — Other Ambulatory Visit: Payer: Self-pay

## 2019-09-20 DIAGNOSIS — E1169 Type 2 diabetes mellitus with other specified complication: Secondary | ICD-10-CM

## 2019-09-20 DIAGNOSIS — E119 Type 2 diabetes mellitus without complications: Secondary | ICD-10-CM | POA: Insufficient documentation

## 2019-09-20 DIAGNOSIS — R079 Chest pain, unspecified: Secondary | ICD-10-CM | POA: Diagnosis present

## 2019-09-20 DIAGNOSIS — Z87891 Personal history of nicotine dependence: Secondary | ICD-10-CM | POA: Insufficient documentation

## 2019-09-20 DIAGNOSIS — E1159 Type 2 diabetes mellitus with other circulatory complications: Secondary | ICD-10-CM

## 2019-09-20 DIAGNOSIS — I10 Essential (primary) hypertension: Secondary | ICD-10-CM | POA: Diagnosis not present

## 2019-09-20 DIAGNOSIS — F419 Anxiety disorder, unspecified: Secondary | ICD-10-CM | POA: Diagnosis present

## 2019-09-20 DIAGNOSIS — E782 Mixed hyperlipidemia: Secondary | ICD-10-CM | POA: Diagnosis present

## 2019-09-20 DIAGNOSIS — I25118 Atherosclerotic heart disease of native coronary artery with other forms of angina pectoris: Secondary | ICD-10-CM | POA: Diagnosis not present

## 2019-09-20 DIAGNOSIS — Z955 Presence of coronary angioplasty implant and graft: Secondary | ICD-10-CM | POA: Insufficient documentation

## 2019-09-20 DIAGNOSIS — R072 Precordial pain: Principal | ICD-10-CM | POA: Insufficient documentation

## 2019-09-20 DIAGNOSIS — Z20828 Contact with and (suspected) exposure to other viral communicable diseases: Secondary | ICD-10-CM | POA: Diagnosis not present

## 2019-09-20 DIAGNOSIS — Z794 Long term (current) use of insulin: Secondary | ICD-10-CM

## 2019-09-20 DIAGNOSIS — I2510 Atherosclerotic heart disease of native coronary artery without angina pectoris: Secondary | ICD-10-CM | POA: Diagnosis not present

## 2019-09-20 DIAGNOSIS — E785 Hyperlipidemia, unspecified: Secondary | ICD-10-CM | POA: Insufficient documentation

## 2019-09-20 DIAGNOSIS — I252 Old myocardial infarction: Secondary | ICD-10-CM | POA: Diagnosis not present

## 2019-09-20 DIAGNOSIS — Z79899 Other long term (current) drug therapy: Secondary | ICD-10-CM | POA: Diagnosis not present

## 2019-09-20 LAB — SARS CORONAVIRUS 2 (TAT 6-24 HRS): SARS Coronavirus 2: NEGATIVE

## 2019-09-20 LAB — BASIC METABOLIC PANEL
Anion gap: 13 (ref 5–15)
BUN: 15 mg/dL (ref 6–20)
CO2: 20 mmol/L — ABNORMAL LOW (ref 22–32)
Calcium: 9.5 mg/dL (ref 8.9–10.3)
Chloride: 103 mmol/L (ref 98–111)
Creatinine, Ser: 0.7 mg/dL (ref 0.61–1.24)
GFR calc Af Amer: >60 mL/min (ref >60)
GFR calc non Af Amer: >60 mL/min (ref >60)
Glucose, Bld: 369 mg/dL — ABNORMAL HIGH (ref 70–99)
Potassium: 4.1 mmol/L (ref 3.5–5.1)
Sodium: 136 mmol/L (ref 135–145)

## 2019-09-20 LAB — HIV ANTIBODY (ROUTINE TESTING W REFLEX): HIV Screen 4th Generation wRfx: NONREACTIVE

## 2019-09-20 LAB — CBC
HCT: 47.3 % (ref 39.0–52.0)
Hemoglobin: 16 g/dL (ref 13.0–17.0)
MCH: 31.3 pg (ref 26.0–34.0)
MCHC: 33.8 g/dL (ref 30.0–36.0)
MCV: 92.6 fL (ref 80.0–100.0)
Platelets: 320 10*3/uL (ref 150–400)
RBC: 5.11 MIL/uL (ref 4.22–5.81)
RDW: 12.9 % (ref 11.5–15.5)
WBC: 5 10*3/uL (ref 4.0–10.5)
nRBC: 0 % (ref 0.0–0.2)

## 2019-09-20 LAB — TROPONIN I (HIGH SENSITIVITY)
Troponin I (High Sensitivity): 2 ng/L (ref <18)
Troponin I (High Sensitivity): 2 ng/L (ref <18)
Troponin I (High Sensitivity): 2 ng/L (ref <18)

## 2019-09-20 LAB — GLUCOSE, CAPILLARY: Glucose-Capillary: 318 mg/dL — ABNORMAL HIGH (ref 70–99)

## 2019-09-20 LAB — CBG MONITORING, ED: Glucose-Capillary: 287 mg/dL — ABNORMAL HIGH (ref 70–99)

## 2019-09-20 MED ORDER — SODIUM CHLORIDE 0.9% FLUSH
3.0000 mL | Freq: Two times a day (BID) | INTRAVENOUS | Status: DC
  Administered 2019-09-20 – 2019-09-21 (×2): 3 mL via INTRAVENOUS

## 2019-09-20 MED ORDER — ALBUTEROL SULFATE (2.5 MG/3ML) 0.083% IN NEBU: 2.5000 mg | INHALATION_SOLUTION | RESPIRATORY_TRACT | Status: DC | PRN

## 2019-09-20 MED ORDER — PNEUMOCOCCAL VAC POLYVALENT 25 MCG/0.5ML IJ INJ
0.5000 mL | INJECTION | INTRAMUSCULAR | Status: DC
  Filled 2019-09-20: qty 0.5

## 2019-09-20 MED ORDER — ACETAMINOPHEN 325 MG PO TABS: 650.0000 mg | ORAL_TABLET | Freq: Four times a day (QID) | ORAL | Status: DC | PRN

## 2019-09-20 MED ORDER — ONDANSETRON HCL 4 MG/2ML IJ SOLN: 4.0000 mg | Freq: Four times a day (QID) | INTRAMUSCULAR | Status: DC | PRN

## 2019-09-20 MED ORDER — TRAZODONE HCL 50 MG PO TABS: 50.0000 mg | ORAL_TABLET | Freq: Every evening | ORAL | Status: DC | PRN

## 2019-09-20 MED ORDER — NITROGLYCERIN 0.4 MG SL SUBL: 0.4000 mg | SUBLINGUAL_TABLET | SUBLINGUAL | Status: DC | PRN

## 2019-09-20 MED ORDER — INSULIN ASPART 100 UNIT/ML ~~LOC~~ SOLN
0.0000 [IU] | Freq: Every day | SUBCUTANEOUS | Status: DC
  Administered 2019-09-20: 4 [IU] via SUBCUTANEOUS

## 2019-09-20 MED ORDER — INSULIN ASPART 100 UNIT/ML ~~LOC~~ SOLN
0.0000 [IU] | Freq: Three times a day (TID) | SUBCUTANEOUS | Status: DC
  Administered 2019-09-21 (×2): 5 [IU] via SUBCUTANEOUS

## 2019-09-20 MED ORDER — ONDANSETRON HCL 4 MG PO TABS: 4.0000 mg | ORAL_TABLET | Freq: Four times a day (QID) | ORAL | Status: DC | PRN

## 2019-09-20 MED ORDER — ASPIRIN EC 81 MG PO TBEC
81.0000 mg | DELAYED_RELEASE_TABLET | Freq: Every day | ORAL | Status: DC
  Administered 2019-09-21: 81 mg via ORAL
  Filled 2019-09-20: qty 1

## 2019-09-20 MED ORDER — INSULIN DETEMIR 100 UNIT/ML ~~LOC~~ SOLN
8.0000 [IU] | Freq: Every day | SUBCUTANEOUS | Status: DC
  Administered 2019-09-20: 8 [IU] via SUBCUTANEOUS
  Filled 2019-09-20 (×2): qty 0.08

## 2019-09-20 MED ORDER — POLYETHYLENE GLYCOL 3350 17 G PO PACK: 17.0000 g | PACK | Freq: Every day | ORAL | Status: DC | PRN

## 2019-09-20 MED ORDER — MORPHINE SULFATE (PF) 2 MG/ML IV SOLN
2.0000 mg | INTRAVENOUS | Status: DC | PRN
  Administered 2019-09-21: 2 mg via INTRAVENOUS
  Filled 2019-09-20: qty 1

## 2019-09-20 MED ORDER — BUSPIRONE HCL 5 MG PO TABS
7.5000 mg | ORAL_TABLET | Freq: Three times a day (TID) | ORAL | Status: DC
  Administered 2019-09-20 – 2019-09-21 (×2): 7.5 mg via ORAL
  Filled 2019-09-20 (×2): qty 2

## 2019-09-20 MED ORDER — SODIUM CHLORIDE 0.9 % IV SOLN: 250.0000 mL | INTRAVENOUS | Status: DC | PRN

## 2019-09-20 MED ORDER — INSULIN ASPART 100 UNIT/ML ~~LOC~~ SOLN
3.0000 [IU] | Freq: Three times a day (TID) | SUBCUTANEOUS | Status: DC
  Administered 2019-09-21: 3 [IU] via SUBCUTANEOUS

## 2019-09-20 MED ORDER — TRAZODONE HCL 50 MG PO TABS
50.0000 mg | ORAL_TABLET | Freq: Every day | ORAL | Status: DC
  Administered 2019-09-20: 50 mg via ORAL
  Filled 2019-09-20: qty 1

## 2019-09-20 MED ORDER — LORAZEPAM 2 MG/ML IJ SOLN: 1.0000 mg | Freq: Four times a day (QID) | INTRAMUSCULAR | Status: DC | PRN

## 2019-09-20 MED ORDER — ACETAMINOPHEN 650 MG RE SUPP: 650.0000 mg | Freq: Four times a day (QID) | RECTAL | Status: DC | PRN

## 2019-09-20 MED ORDER — LOSARTAN POTASSIUM 50 MG PO TABS
100.0000 mg | ORAL_TABLET | Freq: Every day | ORAL | Status: DC
  Administered 2019-09-20: 100 mg via ORAL
  Filled 2019-09-20 (×2): qty 2

## 2019-09-20 MED ORDER — INSULIN DETEMIR 100 UNIT/ML ~~LOC~~ SOLN: 12.0000 [IU] | Freq: Every day | SUBCUTANEOUS | Status: DC

## 2019-09-20 MED ORDER — SODIUM CHLORIDE 0.9% FLUSH: 3.0000 mL | INTRAVENOUS | Status: DC | PRN

## 2019-09-20 MED ORDER — HEPARIN SODIUM (PORCINE) 5000 UNIT/ML IJ SOLN
5000.0000 [IU] | Freq: Three times a day (TID) | INTRAMUSCULAR | Status: DC
  Administered 2019-09-20 – 2019-09-21 (×2): 5000 [IU] via SUBCUTANEOUS
  Filled 2019-09-20 (×2): qty 1

## 2019-09-20 MED ORDER — CARVEDILOL 3.125 MG PO TABS
3.1250 mg | ORAL_TABLET | Freq: Two times a day (BID) | ORAL | Status: DC
  Administered 2019-09-20 – 2019-09-21 (×2): 3.125 mg via ORAL
  Filled 2019-09-20 (×5): qty 1

## 2019-09-20 MED ORDER — ATORVASTATIN CALCIUM 40 MG PO TABS: 40.0000 mg | ORAL_TABLET | Freq: Every day | ORAL | Status: DC

## 2019-09-20 NOTE — H&P (Signed)
Patient Demographics:    Shawn Hughes, is a 53 y.o. 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  If patient rules out for ACS, will need further cardiovascular risk stratification . Cardiology consult to help decide if Stress test is needed in am Versus other diagnostic modalities.   Give aspirin, nitroglycerin, and Coreg -Most likely stress test in the a.m, defer to cardiology team -History of intolerance to statins, okay to give Lipitor for now, will probably need to get prior authorization for Repatha given diet very high risk from a CAD standpoint -Patient has not seen cardiologist or had any significant cardiac work-up in over 2 years -Echo pending -Fasting lipid profile and TSH pending  2)DM2-diagnosed in early 2018 about 4 months after his MI, recent A1c above 13 reflecting uncontrolled DM -Run out of insulin recently -Usually on Levemir 12 units, will decrease to 8 units for tonight because he will be n.p.o. for stress test in the a.m. -Use Novolog/Humalog Sliding scale insulin with Accu-Cheks/Fingersticks as ordered   3)HTN-Coreg 3.125 twice daily, losartan 100 mg daily  4) anxiety disorder--start BuSpar, may use lorazepam as needed   With History of - Reviewed by me  Past Medical History:  Diagnosis Date  . CAD  (coronary artery disease)    a. 08/2016: cath showing 40% prox RCA, 10% LCx, 20% LAD and 99% RI stenosis. DES to RI.  Marland Kitchen. GERD (gastroesophageal reflux disease)   . History of nephrolithiasis   . HLD (hyperlipidemia)   . Type 2 diabetes mellitus (HCC)       Past Surgical History:  Procedure Laterality Date  . CARDIAC CATHETERIZATION N/A 08/30/2016   Procedure: Left Heart Cath and Coronary Angiography;  Surgeon: Kathleene Hazelhristopher D McAlhany, MD;  [facility];  Service: Cardiovascular;  Laterality: N/A;  . CARDIAC CATHETERIZATION N/A 08/30/2016   Procedure: Coronary Stent Intervention;  Surgeon: Kathleene Hazelhristopher D McAlhany, MD;  Location: [facility] INVASIVE CV LAB;  Service: Cardiovascular;  Laterality: N/A;  . ESOPHAGOGASTRODUODENOSCOPY  01/10/2012   WUJ:[id number]:Stricture in the distal esophagus2O TO GERD/Mild gastritis  . LACERATION REPAIR     Right arm      Chief Complaint  Patient presents with  . Chest Pain      HPI:    Shawn Hughes  is a 53 y.o. male who is a reformed smoker with past medical history relevant for DM, HLD, HTN, and history of CAD  with prior NSTEMI and subsequent angioplasty and stent placement in October 2017 (08/2016: cath showing 40% prox RCA, 10% LCx, 20% LAD and 99% RI stenosis. DES to RI) -patient also has history of GERD and prior esophageal stricture presents to the ED with complaints of intermittent chest discomfort since 09/18/2019 -Chest pains remind him of his episode back in 2017 when he had MI, chest pain often is relieved with nitro, chest pain often occurs with activity -Over the weekend chest pain was so severe that he thought he might syncopize -Had left upper extremity numbness and tingling associated with this chest pains  -Patient admits to noncompliance with medications apparently ran out of losartan and his insulin so blood sugars and blood pressures have been running high  -Additional history obtained from patient's wife at bedside -Troponin  negative -EKG sinus without ACS pattern -Chemistry remarkable for glucose of 368  -No leg swelling or pleuritic symptoms -No significant shortness of breath orthopnea     Review of systems:    In addition to the HPI above,   A full Review of  Systems was done, all other systems reviewed are negative except as noted above in HPI , .    Social History:  Reviewed by me    Social History   Tobacco Use  . Smoking status: Former Smoker    Packs/day: 0.00    Years: 15.00    Pack years: 0.00    Types: Cigarettes    Start date: 09/11/1983    Quit date: 08/30/2016    Years since quitting: 3.0  . Smokeless tobacco: Never Used  Substance Use Topics  . Alcohol use: Yes    Comment: occasional       Family History :  Reviewed by me    Family History  Problem Relation Age of Onset  . CAD Mother 52  . CAD Father 77  . Hypertension Brother   . Colon cancer Neg Hx   . Colon polyps Neg Hx   . Esophageal cancer Neg Hx   . Pancreatic cancer Neg Hx   . Stomach cancer Neg Hx      Home Medications:   Prior to Admission medications   Medication Sig Start Date End Date Taking? Authorizing Provider  aspirin EC 81 MG tablet Take 81-162 mg by mouth daily.   Yes [provider]  insulin detemir (LEVEMIR) 100 UNIT/ML injection Inject 12 Units into the skin at bedtime.   Yes [provider]  losartan (COZAAR) 100 MG tablet Take 100 mg by mouth daily.   Yes [provider]  metFORMIN (GLUMETZA) 1000 MG (MOD) 24 hr tablet Take 1,000 mg by mouth daily with breakfast.   Yes [provider]  nitroGLYCERIN (NITROSTAT) 0.4 MG SL tablet Place 0.4 mg under the tongue every 5 (five) minutes as needed for chest pain.   Yes [provider]     Allergies:     Allergies  Allergen Reactions  . Hydrocodone Itching  . Insulin Glargine      Physical Exam:   Vitals  Blood pressure (!) 126/98, pulse 84, temperature 97.8 F (36.6 C), temperature  source Oral, resp. rate 18, height 5\' 9"  (1.753 m), weight 88.9 kg, SpO2 97 %.  Physical Examination: General appearance - alert, well appearing, and in no distress  Mental status - alert, oriented to person, place, and time, patient is anxious Eyes - sclera anicteric Neck - supple, no JVD elevation , Chest - clear  to auscultation bilaterally,  symmetrical air movement,  Heart - S1 and S2 normal, regular  Abdomen - soft, nontender, nondistended, no masses or organomegaly Neurological - screening mental status exam normal, neck supple without rigidity, cranial nerves II through XII intact, DTR's normal and symmetric Extremities - no pedal edema noted, intact peripheral pulses  Skin - warm, dry     Data Review:    CBC Recent Labs  Lab 09/20/19 1003  WBC 5.0  HGB 16.0  HCT 47.3  PLT 320  MCV 92.6  MCH 31.3  MCHC 33.8  RDW 12.9   ------------------------------------------------------------------------------------------------------------------  Chemistries  Recent Labs  Lab 09/20/19 1003  NA 136  K 4.1  CL 103  CO2 20*  GLUCOSE 369*  BUN 15  CREATININE 0.70  CALCIUM 9.5   ------------------------------------------------------------------------------------------------------------------ estimated creatinine clearance is 120.5 mL/min (by C-G formula based on SCr of 0.7 mg/dL). ------------------------------------------------------------------------------------------------------------------ No results for input(s): TSH, T4TOTAL, T3FREE, THYROIDAB in the last 72 hours.  Invalid input(s): FREET3   Coagulation profile No results for input(s): INR, PROTIME in the last 168 hours. ------------------------------------------------------------------------------------------------------------------- No results for input(s): DDIMER in the last 72 hours. -------------------------------------------------------------------------------------------------------------------  Cardiac  Enzymes No results for input(s): CKMB, TROPONINI, MYOGLOBIN in the last 168 hours.  Invalid input(s): CK ------------------------------------------------------------------------------------------------------------------ No results found for: BNP   ---------------------------------------------------------------------------------------------------------------  Urinalysis    Component Value Date/Time   COLORURINE YELLOW 12/14/2016 0837   APPEARANCEUR CLEAR 12/14/2016 0837   LABSPEC 1.010 12/14/2016 0837   PHURINE 5.5 12/14/2016 0837   GLUCOSEU >=500 (A) 12/14/2016 0837   HGBUR NEGATIVE 12/14/2016 0837   BILIRUBINUR NEGATIVE 12/14/2016 0837   KETONESUR 15 (A) 12/14/2016 0837   PROTEINUR NEGATIVE 12/14/2016 0837   UROBILINOGEN 0.2 02/04/2015 2045   NITRITE NEGATIVE 12/14/2016 0837   LEUKOCYTESUR NEGATIVE 12/14/2016 0837    ----------------------------------------------------------------------------------------------------------------   Imaging Results:    Dg Chest Portable 1 View  Result Date: 09/20/2019 CLINICAL DATA:  Chest pain EXAM: PORTABLE CHEST 1 VIEW COMPARISON:  May 24, 2018 FINDINGS: Lungs are clear. Heart size and pulmonary vascularity are normal. No adenopathy. No pneumothorax. No bone lesions. IMPRESSION: No edema or consolidation. Electronically Signed   By: Bretta Bang III M.D.   On: 09/20/2019 10:21    Radiological Exams on Admission: Dg Chest Portable 1 View  Result Date: 09/20/2019 CLINICAL DATA:  Chest pain EXAM: PORTABLE CHEST 1 VIEW COMPARISON:  May 24, 2018 FINDINGS: Lungs are clear. Heart size and pulmonary vascularity are normal. No adenopathy. No pneumothorax. No bone lesions. IMPRESSION: No edema or consolidation. Electronically Signed   By: Bretta Bang III M.D.   On: 09/20/2019 10:21    DVT Prophylaxis -SCD  /heparin AM Labs Ordered, also please review Full Orders  Family Communication: Admission, patients condition and plan of care  including tests being ordered have been discussed with the patient and wife who indicate understanding and agree with the plan   Code Status - Full Code  Likely DC to  home  Condition   stable  Shon Hale M.D on 09/20/2019 at 5:29 PM Go to [email or web address] -  for contact info  [facility] - Office  [phone]

## 2019-09-20 NOTE — ED Provider Notes (Signed)
[facility] EMERGENCY DEPARTMENT Provider Note   CSN: [id number] Arrival date & time: 09/20/19  0932     History   Chief Complaint Chief Complaint  Patient presents with  . Chest Pain    HPI Sander D Clack is a 53 y.o. male.     HPI   Edelmiro D Pilch is a 53 y.o. male with past medical history of coronary artery disease and previous non-STEMI in 2017, type 2 diabetes, hyperlipidemia, and reflux.  He presents to the Emergency Department complaining of chest pain that began 2 days ago.  Initially, he describes the pain as substernal and sharp and radiated through his jaw and into his nose.  He took 1 nitroglycerin that somewhat relieved his pain.  Pain persisted yesterday and was again sharp in quality and improved with 1 nitroglycerin.  This morning, pain again returned at 7:30 AM.  He states he was going up some steps to his job when the pain became more intense and today was he describes the pain as tightness in his upper chest and radiating to his left collarbone.  He took 1 sublingual nitroglycerin and two 81 mg aspirin and pain has again improved upon arrival.  He complains of some associated dizziness and nausea today with one episode of vomiting earlier.  He reports significant improvement of his chest pain at this time.  He denies further nausea or vomiting, abdominal pain, shortness of breath, diaphoresis, extremity or jaw pain.  Patient had successful left heart cath and coronary angiography with 1 stent to the RI branch, 08/30/2016 .  He denies having routine cardiology follow-up since his catheterization.  He is a former smoker    Past Medical History:  Diagnosis Date  . CAD (coronary artery disease)    a. 08/2016: cath showing 40% prox RCA, 10% LCx, 20% LAD and 99% RI stenosis. DES to RI.  Marland Kitchen GERD (gastroesophageal reflux disease)   . History of nephrolithiasis   . HLD (hyperlipidemia)   . Type 2 diabetes mellitus [facility])     Patient Active Problem List   Diagnosis  Date Noted  . Diabetes mellitus ([place]) 05/11/2019  . Hypertension 05/11/2019  . Tobacco use 09/01/2016  . ACS (acute coronary syndrome) ([place]) 08/30/2016  . NSTEMI (non-ST elevated myocardial infarction) ([place])   . Loose stools 09/02/2013  . GERD (gastroesophageal reflux disease) 09/02/2013  . Chest pain 01/09/2012  . WEIGHT LOSS 08/03/2010  . NAUSEA WITH VOMITING 08/03/2010  . CHANGE IN BOWELS 08/03/2010  . EPIGASTRIC PAIN 08/03/2010    Past Surgical History:  Procedure Laterality Date  . CARDIAC CATHETERIZATION N/A 08/30/2016   Procedure: Left Heart Cath and Coronary Angiography;  Surgeon: Burnell Blanks, MD;  Location: [place] [facility];  Service: Cardiovascular;  Laterality: N/A;  . CARDIAC CATHETERIZATION N/A 08/30/2016   Procedure: Coronary Stent Intervention;  Surgeon: Burnell Blanks, MD;  Location: [place] [facility];  Service: Cardiovascular;  Laterality: N/A;  . ESOPHAGOGASTRODUODENOSCOPY  01/10/2012   [id number] in the distal esophagus2O TO GERD/Mild gastritis  . LACERATION REPAIR     Right arm        Home Medications    Prior to Admission medications   Medication Sig Start Date End Date Taking? Authorizing Provider  glyBURIDE (DIABETA) 5 MG tablet TAKE 2 TABLETS WITH BREAKFAST OR THE FIRST MAIN MEAL OF THE DAY 02/22/19   [provider]  HYDROcodone-acetaminophen (NORCO/VICODIN) 5-325 MG tablet One tablet every four hours for pain. 05/11/19   Sanjuana Kava,  MD  LORazepam (ATIVAN) 1 MG tablet Take 1 tablet (1 mg total) by mouth 2 (two) times daily as needed (muscle spasm). Patient not taking: Reported on 04/27/2019 05/24/18   Raeford RazorKohut, Stephen, MD  naproxen (NAPROSYN) 500 MG tablet Take 1 tablet (500 mg total) by mouth 2 (two) times daily with a meal. 04/27/19   Darreld McleanKeeling, Wayne, MD  oxyCODONE-acetaminophen (PERCOCET/ROXICET) 5-325 MG tablet Take 1-2 tablets by mouth every 4 (four) hours as needed. Patient not taking: Reported on 04/27/2019 05/24/18    Raeford RazorKohut, Stephen, MD  oxyCODONE-acetaminophen (PERCOCET/ROXICET) 5-325 MG tablet Take 2 tablets by mouth every 4 (four) hours as needed for severe pain. Patient not taking: Reported on 04/27/2019 05/24/18   Raeford RazorKohut, Stephen, MD  valsartan (DIOVAN) 160 MG tablet  04/22/19   [provider]    Family History Family History  Problem Relation Age of Onset  . CAD Mother 4860  . CAD Father 3558  . Hypertension Brother   . Colon cancer Neg Hx   . Colon polyps Neg Hx   . Esophageal cancer Neg Hx   . Pancreatic cancer Neg Hx   . Stomach cancer Neg Hx     Social History Social History   Tobacco Use  . Smoking status: Former Smoker    Packs/day: 0.00    Years: 15.00    Pack years: 0.00    Types: Cigarettes    Start date: 09/11/1983    Quit date: 08/30/2016    Years since quitting: 3.0  . Smokeless tobacco: Never Used  Substance Use Topics  . Alcohol use: Yes    Comment: occasional  . Drug use: No     Allergies   Hydrocodone and Insulin glargine   Review of Systems Review of Systems  Constitutional: Negative for appetite change, chills and fever.  HENT: Negative for trouble swallowing.   Respiratory: Positive for chest tightness. Negative for shortness of breath.   Cardiovascular: Positive for chest pain. Negative for palpitations and leg swelling.  Gastrointestinal: Negative for abdominal pain, nausea and vomiting.  Musculoskeletal: Negative for arthralgias.  Skin: Negative for rash.  Neurological: Negative for weakness and numbness.     Physical Exam Updated Vital Signs BP (!) 131/98 (BP Location: Left Arm)   Pulse 85   Temp 97.8 F (36.6 C) (Oral)   Resp 18   Ht 5\' 9"  (1.753 m)   Wt 88.9 kg   SpO2 97%   BMI 28.94 kg/m   Physical Exam Constitutional:      Appearance: He is not toxic-appearing.     Comments: Patient is somewhat anxious appearing.  No diaphoresis  HENT:     Head: Atraumatic.     Mouth/Throat:     Mouth: Mucous membranes are moist.  Neck:      Musculoskeletal: Normal range of motion.  Cardiovascular:     Rate and Rhythm: Normal rate and regular rhythm.     Pulses: Normal pulses.  Pulmonary:     Effort: Pulmonary effort is normal.     Breath sounds: Normal breath sounds.  Chest:     Chest wall: No tenderness.  Abdominal:     General: There is no distension.     Palpations: Abdomen is soft.     Tenderness: There is no abdominal tenderness.  Musculoskeletal:     Right lower leg: No edema.     Left lower leg: No edema.  Skin:    General: Skin is warm.     Capillary Refill: Capillary refill takes  less than 2 seconds.  Neurological:     General: No focal deficit present.     Mental Status: He is alert.     Sensory: No sensory deficit.     Motor: No weakness.      ED Treatments / Results  Labs (all labs ordered are listed, but only abnormal results are displayed) Labs Reviewed  BASIC METABOLIC PANEL - Abnormal; Notable for the following components:      Result Value   CO2 20 (*)    Glucose, Bld 369 (*)    All other components within normal limits  SARS CORONAVIRUS 2 (TAT 6-24 HRS)  CBC  CBG MONITORING, ED  TROPONIN I (HIGH SENSITIVITY)  TROPONIN I (HIGH SENSITIVITY)    EKG EKG Interpretation  Date/Time:  Monday September 20 2019 09:43:02 EDT Ventricular Rate:  89 PR Interval:    QRS Duration: 93 QT Interval:  352 QTC Calculation: 429 R Axis:   73 Text Interpretation:  Sinus rhythm Short PR interval Posterior infarct, old Confirmed by Donnetta Hutching ([id number]) on 09/20/2019 9:45:24 AM   Radiology Dg Chest Portable 1 View  Result Date: 09/20/2019 CLINICAL DATA:  Chest pain EXAM: PORTABLE CHEST 1 VIEW COMPARISON:  May 24, 2018 FINDINGS: Lungs are clear. Heart size and pulmonary vascularity are normal. No adenopathy. No pneumothorax. No bone lesions. IMPRESSION: No edema or consolidation. Electronically Signed   By: Bretta Bang III M.D.   On: 09/20/2019 10:21    Procedures Procedures (including  critical care time)  Medications Ordered in ED Medications - No data to display   Initial Impression / Assessment and Plan / ED Course  I have reviewed the triage vital signs and the nursing notes.  Pertinent labs & imaging results that were available during my care of the patient were reviewed by me and considered in my medical decision making (see chart for details).    Patient with medical history that is significant for diabetes and previous NSTEMI.  Waxing and waning chest pain for 2 days.  No acute EKG findings on arrival, will obtain labs, chest x-ray.  He will likely need admission for cardiac rule out.    Pt also seen by Dr. Adriana Simas and care discussed.     1145  On recheck, chest pain returning, SL NTG ordered.  Vitals reviewed.    1200  Consulted Dr. Purvis Sheffield and discussed findings, recommends admit to hospitalist for r/o  [facility] hospitalist who agrees to admit  Final Clinical Impressions(s) / ED Diagnoses   Final diagnoses:  Precordial pain    ED Discharge Orders    None       Pauline Aus, PA-C 09/20/19 1426    Donnetta Hutching, MD 09/21/19 [phone]

## 2019-09-20 NOTE — Consult Note (Addendum)
Cardiology Consultation:   Patient ID: Shawn Hughes MRN: [id number]; DOB: 01/02/1966  Admit date: 09/20/2019 Date of Consult: 09/20/2019  Primary Care Provider: System, Provider Not In Primary Cardiologist: Nona Dell, MD  Primary Electrophysiologist:  None    Patient Profile:   Shawn Hughes is a 53 y.o. male with a hx of CAD with cath 2017 with 99% RI stenosis having DES placed, GERD, HLD, DM-2, esophageal stricture who is being seen today for the evaluation of chest pain at the request of Dr. Mariea Clonts.  History of Present Illness:   Shawn Hughes has prior hx of CAD with stent to RI in 2017 and nonobstructive CAD otherwise, other hx as above.  Last seen in our office 2018.   Now presents with sharp chest pain, and relief with NTG on Sat, pain returned on Sunday and another nTG with relief.  Today Shawn Hughes had chest tightness and NTG reduced form 8 to a 3 on 1-10 pain scale.  Some SOB, dizziness, diaphoresis and vomited once.  Shawn Hughes took total of 162 mg ASA. Marland Kitchen   The Sat episode was bad, Shawn Hughes thought Shawn Hughes would pass out and reminded him of his 2017 admit.  Shawn Hughes is on losartan and has been out of medicine for 2 weeks.  Shawn Hughes has had increased anxiety and been unable to sleep recently.  This pain did not feel like his typical GERD.  Echo from 2017 with EF 55-60% normal Echo.  Cath as above.    EKG:  The EKG was personally reviewed and demonstrates:  SR old post MI and no acute ST changes from prior EKG 2017.   Telemetry:  Telemetry was personally reviewed and demonstrates:  SR  Na 136, K+ 4.1, glucose 369, Cr 0.70  Hgb 16, WBC 5.0 plts 320  CXR no  Active disease.  Currently pain free and hungry.  Wife is in room with him.  No colds, fevers or cough recently.  Shawn Hughes has intolerance to statins.  Has been on 4 different ones.   Heart Pathway Score:     Past Medical History:  Diagnosis Date  . CAD (coronary artery disease)    a. 08/2016: cath showing 40% prox RCA, 10% LCx, 20% LAD and 99% RI  stenosis. DES to RI.  Marland Kitchen GERD (gastroesophageal reflux disease)   . History of nephrolithiasis   . HLD (hyperlipidemia)   . Type 2 diabetes mellitus (HCC)     Past Surgical History:  Procedure Laterality Date  . CARDIAC CATHETERIZATION N/A 08/30/2016   Procedure: Left Heart Cath and Coronary Angiography;  Surgeon: Kathleene Hazel, MD;  Location: [facility] INVASIVE CV LAB;  Service: Cardiovascular;  Laterality: N/A;  . CARDIAC CATHETERIZATION N/A 08/30/2016   Procedure: Coronary Stent Intervention;  Surgeon: Kathleene Hazel, MD;  Location: [facility] INVASIVE CV LAB;  Service: Cardiovascular;  Laterality: N/A;  . ESOPHAGOGASTRODUODENOSCOPY  01/10/2012   WUJ:[id number] in the distal esophagus2O TO GERD/Mild gastritis  . LACERATION REPAIR     Right arm     Home Medications:  Prior to Admission medications   Medication Sig Start Date End Date Taking? Authorizing Provider  aspirin EC 81 MG tablet Take 81-162 mg by mouth daily.   Yes [provider]  insulin detemir (LEVEMIR) 100 UNIT/ML injection Inject 12 Units into the skin at bedtime.   Yes [provider]  losartan (COZAAR) 100 MG tablet Take 100 mg by mouth daily.   Yes [provider]  metFORMIN (GLUMETZA) 1000 MG (MOD) 24 hr  tablet Take 1,000 mg by mouth daily with breakfast.   Yes [provider]  nitroGLYCERIN (NITROSTAT) 0.4 MG SL tablet Place 0.4 mg under the tongue every 5 (five) minutes as needed for chest pain.   Yes [provider]    Inpatient Medications: Scheduled Meds:  Continuous Infusions:  PRN Meds: nitroGLYCERIN  Allergies:    Allergies  Allergen Reactions  . Hydrocodone Itching  . Insulin Glargine     Social History:   Social History   Socioeconomic History  . Marital status: Married    Spouse name: Not on file  . Number of children: Not on file  . Years of education: Not on file  . Highest education level: Not on file  Occupational History  . Not on file   Social Needs  . Financial resource strain: Not on file  . Food insecurity    Worry: Not on file    Inability: Not on file  . Transportation needs    Medical: Not on file    Non-medical: Not on file  Tobacco Use  . Smoking status: Former Smoker    Packs/day: 0.00    Years: 15.00    Pack years: 0.00    Types: Cigarettes    Start date: 09/11/1983    Quit date: 08/30/2016    Years since quitting: 3.0  . Smokeless tobacco: Never Used  Substance and Sexual Activity  . Alcohol use: Yes    Comment: occasional  . Drug use: No  . Sexual activity: Yes  Lifestyle  . Physical activity    Days per week: Not on file    Minutes per session: Not on file  . Stress: Not on file  Relationships  . Social Musician on phone: Not on file    Gets together: Not on file    Attends religious service: Not on file    Active member of club or organization: Not on file    Attends meetings of clubs or organizations: Not on file    Relationship status: Not on file  . Intimate partner violence    Fear of current or ex partner: Not on file    Emotionally abused: Not on file    Physically abused: Not on file    Forced sexual activity: Not on file  Other Topics Concern  . Not on file  Social History Narrative  . Not on file    Family History:    Family History  Problem Relation Age of Onset  . CAD Mother 21  . CAD Father 18  . Hypertension Brother   . Colon cancer Neg Hx   . Colon polyps Neg Hx   . Esophageal cancer Neg Hx   . Pancreatic cancer Neg Hx   . Stomach cancer Neg Hx      ROS:  Please see the history of present illness.  General:no colds or fevers, no weight changes Skin:no rashes or ulcers HEENT:no blurred vision, no congestion CV:see HPI PUL:see HPI GI:no diarrhea constipation or melena, no indigestion GU:no hematuria, no dysuria MS:no joint pain, no claudication Neuro:no syncope, no lightheadedness Endo:+ diabetes, no thyroid disease  All other ROS reviewed  and negative.     Physical Exam/Data:   Vitals:   09/20/19 1045 09/20/19 1100 09/20/19 1130 09/20/19 1200  BP:  132/86 125/90 (!) 126/98  Pulse: 83 83 77 84  Resp: 18 13 (!) 30 18  Temp:      TempSrc:  SpO2: 98% 95% 96% 97%  Weight:      Height:       No intake or output data in the 24 hours ending 09/20/19 1336 Last 3 Weights 09/20/2019 05/11/2019 04/27/2019  Weight (lbs) 196 lb 195 lb 195 lb  Weight (kg) 88.905 kg 88.451 kg 88.451 kg     Body mass index is 28.94 kg/m.  General:  Well nourished, well developed, in no acute distress HEENT: normal Lymph: no adenopathy Neck: no JVD Endocrine:  No thryomegaly Vascular: No carotid bruits; pedal pulses 2+ bilaterally  Cardiac:  normal S1, S2; RRR; no murmur gallup rub or click Lungs:  clear to auscultation bilaterally, no wheezing, rhonchi or rales  Abd: soft, nontender, no hepatomegaly  Ext: no edema Musculoskeletal:  No deformities, BUE and BLE strength normal and equal Skin: warm and dry  Neuro:  CNs 2-12 intact, no focal abnormalities noted Psych:  Normal affect    Relevant CV Studies: ECHO 08/2016  Study Conclusions  - Left ventricle: The cavity size was normal. Systolic function was   normal. The estimated ejection fraction was in the range of 55%   to 60%.  ------------------------------------------------------------------- Study data:  No prior study was available for comparison.  Study status:  Routine.  Procedure:  Transthoracic echocardiography. Image quality was good.  Study completion:  There were no complications.          Transthoracic echocardiography.  M-mode, complete 2D, spectral Doppler, and color Doppler.  Birthdate: Patient birthdate: Jan 21, 1966.  Age:  Patient is 53 yr old.  Sex: Gender: male.    BMI: 29.9 kg/m^2.  Blood pressure:     116/98 Patient status:  Inpatient.  Study date:  Study date: 08/31/2016. Study time: 08:35 AM.  Location:  Bedside.   -------------------------------------------------------------------  ------------------------------------------------------------------- Left ventricle:  The cavity size was normal. Systolic function was normal. The estimated ejection fraction was in the range of 55% to 60%.  ------------------------------------------------------------------- Aortic valve:   Structurally normal valve.   Cusp separation was normal.  Doppler:  Transvalvular velocity was within the normal range. There was no stenosis. There was no regurgitation.  ------------------------------------------------------------------- Mitral valve:   Structurally normal valve.   Leaflet separation was normal.  Doppler:  Transvalvular velocity was within the normal range. There was no evidence for stenosis. There was no regurgitation.    Peak gradient (D): 3 mm Hg.  ------------------------------------------------------------------- Left atrium:  The atrium was normal in size.  ------------------------------------------------------------------- Right ventricle:  The cavity size was normal. Wall thickness was normal. Systolic function was normal.  ------------------------------------------------------------------- Pulmonic valve:    The valve appears to be grossly normal.  ------------------------------------------------------------------- Tricuspid valve:   Structurally normal valve.   Leaflet separation was normal.  Doppler:  Transvalvular velocity was within the normal range. There was no regurgitation.  ------------------------------------------------------------------- Pericardium:  There was no pericardial effusion.  ------------------------------------------------------------------- Systemic veins: Inferior vena cava: The vessel was normal in size. The respirophasic diameter changes were in the normal range (>= 50%), consistent with normal central venous pressure.   ------------------------------------------------------------------- Measurements   Left ventricle                         Value        Reference  LV ID, ED, PLAX chordal                45.5  mm     43 - 52  LV ID, ES, PLAX chordal  26.6  mm     23 - 38  LV fx shortening, PLAX chordal         42    %      >=29  LV PW thickness, ED                    10.5  mm     ---------  IVS/LV PW ratio, ED                    0.87         <=1.3  LV e&', lateral                         12.9  cm/s   ---------  LV E/e&', lateral                       6.95         ---------  LV e&', medial                          8.81  cm/s   ---------  LV E/e&', medial                        10.18        ---------  LV e&', average                         10.86 cm/s   ---------  LV E/e&', average                       8.26         ---------   Cardiac cath 08/30/16  Prox RCA lesion, 40 %stenosed.  Mid Cx lesion, 10 %stenosed.  Prox LAD to Mid LAD lesion, 20 %stenosed.  A STENT PROMUS PREM MR 2.5X16 drug eluting stent was successfully placed.  Ramus lesion, 99 %stenosed.  Post intervention, there is a 0% residual stenosis.  There is mild left ventricular systolic dysfunction.  LV end diastolic pressure is mildly elevated.  The left ventricular ejection fraction is 50-55% by visual estimate.  There is no mitral valve regurgitation.   1. NSTEMI secondary to severe, ulcerated stenosis Ramus Intermediate branch 2. Successful PTCA/DES x 1 Ramus Intermediate branch 3. Mild non-obstructive disease in the LAD and RCA 4. The overall LV systolic function is preserved but there is hypokinesis of the anterolateral wall.   Recommendations: Will need DAPT with ASA and Brilinta x 1 year. Continue beta blocker and statin. Echo tomorrow then likely d/c home tomorrow.   Diagnostic Dominance: Left  Intervention      Laboratory Data:  High Sensitivity Troponin:   Recent Labs  Lab 09/20/19 1003  09/20/19 1206  TROPONINIHS 2 2     Chemistry Recent Labs  Lab 09/20/19 1003  NA 136  K 4.1  CL 103  CO2 20*  GLUCOSE 369*  BUN 15  CREATININE 0.70  CALCIUM 9.5  GFRNONAA >60  GFRAA >60  ANIONGAP 13    No results for input(s): PROT, ALBUMIN, AST, ALT, ALKPHOS, BILITOT in the last 168 hours. Hematology Recent Labs  Lab 09/20/19 1003  WBC 5.0  RBC 5.11  HGB 16.0  HCT 47.3  MCV 92.6  MCH 31.3  MCHC 33.8  RDW 12.9  PLT 320   BNPNo  results for input(s): BNP, PROBNP in the last 168 hours.  DDimer No results for input(s): DDIMER in the last 168 hours.   Radiology/Studies:  Dg Chest Portable 1 View  Result Date: 09/20/2019 CLINICAL DATA:  Chest pain EXAM: PORTABLE CHEST 1 VIEW COMPARISON:  May 24, 2018 FINDINGS: Lungs are clear. Heart size and pulmonary vascularity are normal. No adenopathy. No pneumothorax. No bone lesions. IMPRESSION: No edema or consolidation. Electronically Signed   By: Lowella Grip III M.D.   On: 09/20/2019 10:21    Assessment and Plan:   1. Chest pain with 3 occurences over 3 days.  Relief with NTG, EKG stable, troponins neg.  Also hx of GERD and esophageal stricture.   Shawn Hughes is diabetic and with CAD not on statin  Would add BB, and do moview - defer to Dr. Bronson Ing - unless pain continues and cath may be needed.  Once in ER Shawn Hughes has had another NTG- will check troponin later today 2.    CAD with prior stent. To RI and other vessels with non obstructive disease. 3.    Presumed HLD not on statin- hs had intolerance to at least 4, needs lipid clinic and possible Repatha  4.  DM-2 insulin dependent per IM 5.  HTN elevated today and has been out of losartan for 3 weeks 6.  Anxiety recent episodes.         For questions or updates, please contact [place] Please consult [email or web address] for contact info under   The patient was seen and examined, and I agree with the history, physical exam, assessment and plan as documented above, with  modifications as noted below. I have also personally reviewed all relevant documentation, old records, labs, and both radiographic and cardiovascular studies. I have also independently interpreted old and new ECG's.  Shawn Hughes is a 53 year old male with a history of coronary artery disease.  Shawn Hughes went drug-eluting stent placement for a 99% ramus lesion on 08/30/2016.  At that time Shawn Hughes had a 40% proximal RCA stenosis and proximal to mid 20% stenosis in the LAD and a mid 10% stenosis of the left circumflex.  Shawn Hughes followed up with Dr. Domenic Polite in 2018 and did not return afterwards.  Earlier this year in January or February Shawn Hughes went to the ED at [facility] in [place] because Shawn Hughes thought Shawn Hughes was having a heart attack.  It turns out Shawn Hughes was having an adverse reaction to a medication.  Shawn Hughes and his wife said an echocardiogram was done and they try doing a stress test but it was incomplete.  This past Saturday Shawn Hughes had an episode of retrosternal chest pain for which Shawn Hughes took 2 aspirin and 1 nitroglycerin with relief.  Symptoms again occurred on Sunday and Shawn Hughes did the same thing.  This morning while Shawn Hughes was walking up the stairs at work Shawn Hughes had severe chest pain radiating to his left shoulder and neck.  Shawn Hughes took 2 aspirin and a nitroglycerin and came to the ED at [facility].  Initial high-sensitivity troponin is normal.  I personally reviewed ECG which demonstrates sinus rhythm with nonspecific T wave abnormalities in lateral leads.  A second high-sensitivity troponin is pending at this time.  Shawn Hughes complains of some mild chest soreness at present.  His blood pressure is elevated.  Shawn Hughes is also been struggling with anxiety and panic attacks over the last 3 weeks.  Unclear if symptoms are due to heart disease or anxiety with panic disorder.  Shawn Hughes did say that  symptoms on Saturday felt like they did prior to stent placement in 2017.  We will await second high-sensitivity troponin.  If it is markedly elevated we will  plan to transfer to [facility] for coronary angiography.  Otherwise, will plan for nuclear stress test tomorrow.  I will try to obtain records from [facility] as well.  Continue aspirin.  Shawn Hughes is statin intolerant.  I will start carvedilol 3.125 mg twice daily.   Prentice Docker, MD, [facility]  09/20/2019 4:23 PM    Signed, Nada Boozer, NP  09/20/2019 1:36 PM

## 2019-09-20 NOTE — ED Triage Notes (Signed)
Pt reports on Saturday had a sharp pain in chest which was relieved by nitro. Pain returned on Sunday and took another nitro which also relieved pain. Today pain returned as a tightness and nitro decreased from 8 to a 3. Some sob, dizziness, diaphoresis, and vomiting x1 today. Took 2 asa 81mg  pta.

## 2019-09-21 ENCOUNTER — Encounter (HOSPITAL_COMMUNITY): Payer: Self-pay

## 2019-09-21 ENCOUNTER — Observation Stay (HOSPITAL_BASED_OUTPATIENT_CLINIC_OR_DEPARTMENT_OTHER): Payer: PRIVATE HEALTH INSURANCE

## 2019-09-21 DIAGNOSIS — I361 Nonrheumatic tricuspid (valve) insufficiency: Secondary | ICD-10-CM

## 2019-09-21 DIAGNOSIS — I25118 Atherosclerotic heart disease of native coronary artery with other forms of angina pectoris: Secondary | ICD-10-CM | POA: Diagnosis not present

## 2019-09-21 DIAGNOSIS — R079 Chest pain, unspecified: Secondary | ICD-10-CM

## 2019-09-21 DIAGNOSIS — I10 Essential (primary) hypertension: Secondary | ICD-10-CM | POA: Diagnosis not present

## 2019-09-21 DIAGNOSIS — R072 Precordial pain: Secondary | ICD-10-CM | POA: Diagnosis not present

## 2019-09-21 DIAGNOSIS — I2510 Atherosclerotic heart disease of native coronary artery without angina pectoris: Secondary | ICD-10-CM

## 2019-09-21 DIAGNOSIS — Z789 Other specified health status: Secondary | ICD-10-CM

## 2019-09-21 DIAGNOSIS — E782 Mixed hyperlipidemia: Secondary | ICD-10-CM | POA: Diagnosis present

## 2019-09-21 DIAGNOSIS — Z955 Presence of coronary angioplasty implant and graft: Secondary | ICD-10-CM | POA: Diagnosis not present

## 2019-09-21 DIAGNOSIS — E785 Hyperlipidemia, unspecified: Secondary | ICD-10-CM

## 2019-09-21 LAB — NM MYOCAR MULTI W/SPECT W/WALL MOTION / EF
LV dias vol: 75 mL (ref 62–150)
LV sys vol: 32 mL
Peak HR: 121 {beats}/min
RATE: 0.32
Rest HR: 90 {beats}/min
SDS: 2
SRS: 0
SSS: 2
TID: 1.2

## 2019-09-21 LAB — BASIC METABOLIC PANEL
Anion gap: 9 (ref 5–15)
BUN: 16 mg/dL (ref 6–20)
CO2: 21 mmol/L — ABNORMAL LOW (ref 22–32)
Calcium: 8.6 mg/dL — ABNORMAL LOW (ref 8.9–10.3)
Chloride: 105 mmol/L (ref 98–111)
Creatinine, Ser: 0.93 mg/dL (ref 0.61–1.24)
GFR calc Af Amer: >60 mL/min (ref >60)
GFR calc non Af Amer: >60 mL/min (ref >60)
Glucose, Bld: 283 mg/dL — ABNORMAL HIGH (ref 70–99)
Potassium: 3.8 mmol/L (ref 3.5–5.1)
Sodium: 135 mmol/L (ref 135–145)

## 2019-09-21 LAB — LIPID PANEL
Cholesterol: 232 mg/dL — ABNORMAL HIGH (ref 0–200)
HDL: 29 mg/dL — ABNORMAL LOW (ref >40)
LDL Cholesterol: 153 mg/dL — ABNORMAL HIGH (ref 0–99)
Total CHOL/HDL Ratio: 8 RATIO
Triglycerides: 251 mg/dL — ABNORMAL HIGH (ref <150)
VLDL: 50 mg/dL — ABNORMAL HIGH (ref 0–40)

## 2019-09-21 LAB — CBC
HCT: 47.1 % (ref 39.0–52.0)
Hemoglobin: 15.7 g/dL (ref 13.0–17.0)
MCH: 31.6 pg (ref 26.0–34.0)
MCHC: 33.3 g/dL (ref 30.0–36.0)
MCV: 94.8 fL (ref 80.0–100.0)
Platelets: 303 10*3/uL (ref 150–400)
RBC: 4.97 MIL/uL (ref 4.22–5.81)
RDW: 12.8 % (ref 11.5–15.5)
WBC: 6.2 10*3/uL (ref 4.0–10.5)
nRBC: 0 % (ref 0.0–0.2)

## 2019-09-21 LAB — TSH: TSH: 1.925 u[IU]/mL (ref 0.350–4.500)

## 2019-09-21 LAB — GLUCOSE, CAPILLARY
Glucose-Capillary: 281 mg/dL — ABNORMAL HIGH (ref 70–99)
Glucose-Capillary: 261 mg/dL — ABNORMAL HIGH (ref 70–99)

## 2019-09-21 LAB — ECHOCARDIOGRAM COMPLETE
Height: 69 in
Weight: 3092.8 oz

## 2019-09-21 MED ORDER — NITROGLYCERIN 0.4 MG SL SUBL: 0.4000 mg | SUBLINGUAL_TABLET | SUBLINGUAL | 4 refills | Status: DC | PRN

## 2019-09-21 MED ORDER — LOSARTAN POTASSIUM 50 MG PO TABS
25.0000 mg | ORAL_TABLET | Freq: Every day | ORAL | Status: DC
  Filled 2019-09-21: qty 1

## 2019-09-21 MED ORDER — INSULIN DETEMIR 100 UNIT/ML ~~LOC~~ SOLN: 12.0000 [IU] | Freq: Every day | SUBCUTANEOUS | 11 refills | Status: DC

## 2019-09-21 MED ORDER — REGADENOSON 0.4 MG/5ML IV SOLN
INTRAVENOUS | Status: AC
  Administered 2019-09-21: 0.4 mg via INTRAVENOUS
  Filled 2019-09-21: qty 5

## 2019-09-21 MED ORDER — BUSPIRONE HCL 10 MG PO TABS: 10.0000 mg | ORAL_TABLET | Freq: Three times a day (TID) | ORAL | 5 refills | Status: DC

## 2019-09-21 MED ORDER — ESCITALOPRAM OXALATE 10 MG PO TABS: 10.0000 mg | ORAL_TABLET | Freq: Every day | ORAL | 2 refills | Status: DC

## 2019-09-21 MED ORDER — METFORMIN HCL ER (MOD) 1000 MG PO TB24: 1000.0000 mg | ORAL_TABLET | Freq: Two times a day (BID) | ORAL | 5 refills | Status: DC

## 2019-09-21 MED ORDER — TRAZODONE HCL 50 MG PO TABS: 50.0000 mg | ORAL_TABLET | Freq: Every evening | ORAL | 2 refills | Status: DC | PRN

## 2019-09-21 MED ORDER — TECHNETIUM TC 99M TETROFOSMIN IV KIT
30.0000 | PACK | Freq: Once | INTRAVENOUS | Status: AC | PRN
  Administered 2019-09-21: 29.8 via INTRAVENOUS

## 2019-09-21 MED ORDER — EZETIMIBE 10 MG PO TABS: 10.0000 mg | ORAL_TABLET | Freq: Every day | ORAL | Status: DC

## 2019-09-21 MED ORDER — SODIUM CHLORIDE FLUSH 0.9 % IV SOLN
INTRAVENOUS | Status: AC
  Administered 2019-09-21: 10 mL via INTRAVENOUS
  Filled 2019-09-21: qty 10

## 2019-09-21 MED ORDER — EZETIMIBE 10 MG PO TABS: 10.0000 mg | ORAL_TABLET | Freq: Every day | ORAL | 3 refills | Status: DC

## 2019-09-21 MED ORDER — ASPIRIN EC 81 MG PO TBEC: 81.0000 mg | DELAYED_RELEASE_TABLET | Freq: Every day | ORAL | 3 refills | Status: DC

## 2019-09-21 MED ORDER — INSULIN DETEMIR 100 UNIT/ML ~~LOC~~ SOLN: 12.0000 [IU] | Freq: Every day | SUBCUTANEOUS | 3 refills | Status: DC

## 2019-09-21 MED ORDER — CARVEDILOL 3.125 MG PO TABS: 3.1250 mg | ORAL_TABLET | Freq: Two times a day (BID) | ORAL | 4 refills | Status: DC

## 2019-09-21 MED ORDER — TECHNETIUM TC 99M TETROFOSMIN IV KIT
10.0000 | PACK | Freq: Once | INTRAVENOUS | Status: AC | PRN
  Administered 2019-09-21: 9.4 via INTRAVENOUS

## 2019-09-21 MED ORDER — LOSARTAN POTASSIUM 25 MG PO TABS: 25.0000 mg | ORAL_TABLET | Freq: Every day | ORAL | 4 refills | Status: DC

## 2019-09-21 MED ORDER — METFORMIN HCL ER (MOD) 500 MG PO TB24: 1000.0000 mg | ORAL_TABLET | Freq: Two times a day (BID) | ORAL | 5 refills | Status: DC

## 2019-09-21 NOTE — TOC Initial Note (Signed)
Transition of Care [facility]) - Initial/Assessment Note    Patient Details  Name: Shawn Hughes MRN: [id number] Date of Birth: 1966/07/24  Transition of Care [facility]) CM/SW Contact:    Annice Needy, LCSW Phone Number: 09/21/2019, 11:50 AM  Clinical Narrative:                 Patient states that he has insurance but recently he stated when he attempted to fill it his insurance said it was not time for a refill but he was out. He stated that his medications can be expensive. Patient identified his PCP as Dr. Jerry Caras with [facility] in [place], [place]. He stated that he will also be starting on a new cholesterol medication that he has been told by his PCP was expensive for all insurances. Advised patient that since he had insurance that he would have to speak with PCP's office about a prescription assistance program. Contact with [facility], [facility] [place], informs that it has to submitted for a 90 day supply. Advised that metformin in their pharmacy has to be 500 to 750mg . Advised attending who will make adjustments to reflect what insurance will cover.  LCSW signing off.       Expected Discharge Plan: Home/Self Care Barriers to Discharge: No Barriers Identified   Patient Goals and CMS Choice Patient states their goals for this hospitalization and ongoing recovery are:: To be able to get medications      Expected Discharge Plan and Services Expected Discharge Plan: Home/Self Care         Expected Discharge Date: 09/21/19                                    Prior Living Arrangements/Services   Lives with:: Spouse Patient language and need for interpreter reviewed:: Yes Do you feel safe going back to the place where you live?: Yes      Need for Family Participation in Patient Care: Yes (Comment) Care giver support system in place?: Yes (comment)   Criminal Activity/Legal Involvement Pertinent to Current Situation/Hospitalization: No - Comment as needed  Activities  of Daily Living Home Assistive Devices/Equipment: CBG Meter, Blood pressure cuff ADL Screening (condition at time of admission) Patient's cognitive ability adequate to safely complete daily activities?: Yes Is the patient deaf or have difficulty hearing?: No Does the patient have difficulty seeing, even when wearing glasses/contacts?: No Does the patient have difficulty concentrating, remembering, or making decisions?: No Patient able to express need for assistance with ADLs?: Yes Does the patient have difficulty dressing or bathing?: No Independently performs ADLs?: Yes (appropriate for developmental age) Does the patient have difficulty walking or climbing stairs?: No Weakness of Legs: None Weakness of Arms/Hands: None  Permission Sought/Granted                  Emotional Assessment Appearance:: Appears stated age   Affect (typically observed): Adaptable Orientation: : Oriented to Self, Oriented to Place, Oriented to  Time, Oriented to Situation Alcohol / Substance Use: Not Applicable Psych Involvement: No (comment)  Admission diagnosis:  Precordial pain [R07.2] Patient Active Problem List   Diagnosis Date Noted  . HLD (hyperlipidemia) 09/21/2019  . Chest pain 09/20/2019  . Coronary artery disease   . History of coronary artery stent placement   . Anxiety disorder   . Diabetes mellitus (HCC) 05/11/2019  . Hypertension 05/11/2019  . Tobacco use 09/01/2016  .  ACS (acute coronary syndrome) ([place]) 08/30/2016  . NSTEMI (non-ST elevated myocardial infarction) ([place])   . Loose stools 09/02/2013  . GERD (gastroesophageal reflux disease) 09/02/2013  . Chest pain 01/09/2012  . WEIGHT LOSS 08/03/2010  . NAUSEA WITH VOMITING 08/03/2010  . CHANGE IN BOWELS 08/03/2010  . EPIGASTRIC PAIN 08/03/2010   PCP:  System, Provider Not In Pharmacy:   [id number] [place], [place] [address] [place] [address] Phone:  [phone] Fax: [phone]     Social Determinants of Health (SDOH) Interventions    Readmission Risk Interventions No flowsheet data found.

## 2019-09-21 NOTE — Progress Notes (Signed)
*  PRELIMINARY RESULTS* Echocardiogram 2D Echocardiogram has been performed.  Shawn Hughes 09/21/2019, 10:28 AM

## 2019-09-21 NOTE — Discharge Summary (Addendum)
Shawn Hughes, is a 53 y.o. male  DOB 25-Oct-1966  MRN [id number].  Admission date:  09/20/2019  Admitting Physician  Shon Hale, MD  Discharge Date:  09/21/2019   Primary MD  System, Provider Not In  Recommendations for primary care physician for things to follow:   1)Avoid ibuprofen/Advil/Aleve/Motrin/Goody Powders/Naproxen/BC powders/Meloxicam/Diclofenac/Indomethacin and other Nonsteroidal anti-inflammatory medications as these will make you more likely to bleed and can cause stomach ulcers, can also cause Kidney problems.   2) please take daily aspirin with food, Coreg and Zetia as well as losartan as prescribed for your heart and blood pressure  3) please follow-up with cardiologist Dr. Diona Browner for recheck and reevaluation as advised, please discuss possible prescription for Repatha to help lower your cholesterol given your intolerance to statin medications  4) please take Lexapro/Escitalopram and buspirone/BuSpar as prescribed for anxiety and panic attacks-  5) please follow-up with your primary care physician in 2 to 3 weeks for reevaluation and adjustment of your medications  6) low-salt and carbohydrate consistent diet advised to help keep your blood pressure and sugars under control  Admission Diagnosis  Precordial pain [R07.2]  Discharge Diagnosis  Precordial pain [R07.2]    Principal Problem:   Chest pain Active Problems:   Diabetes mellitus (HCC)   Hypertension   Coronary artery disease   History of coronary artery stent placement   Anxiety disorder   HLD (hyperlipidemia)      Past Medical History:  Diagnosis Date  . CAD (coronary artery disease)    a. 08/2016: cath showing 40% prox RCA, 10% LCx, 20% LAD and 99% RI stenosis. DES to RI.  Marland Kitchen GERD (gastroesophageal reflux disease)   . History of nephrolithiasis   . HLD (hyperlipidemia)   . Hypertension   . Type 2 diabetes  mellitus (HCC)     Past Surgical History:  Procedure Laterality Date  . CARDIAC CATHETERIZATION N/A 08/30/2016   Procedure: Left Heart Cath and Coronary Angiography;  Surgeon: Kathleene Hazel, MD;  Location: [facility] INVASIVE CV LAB;  Service: Cardiovascular;  Laterality: N/A;  . CARDIAC CATHETERIZATION N/A 08/30/2016   Procedure: Coronary Stent Intervention;  Surgeon: Kathleene Hazel, MD;  Location: [facility] INVASIVE CV LAB;  Service: Cardiovascular;  Laterality: N/A;  . ESOPHAGOGASTRODUODENOSCOPY  01/10/2012   EHO:ZYYQMGNOI in the distal esophagus2O TO GERD/Mild gastritis  . LACERATION REPAIR     Right arm      HPI  from the history and physical done on the day of admission:    -  Shawn Hughes  is a 53 y.o. male who is a reformed smoker with past medical history relevant for DM, HLD, HTN, and history of CAD with prior NSTEMI and subsequent angioplasty and stent placement in October 2017 (08/2016: cath showing 40% prox RCA, 10% LCx, 20% LAD and 99% RI stenosis. DES to RI) -patient also has history of GERD and prior esophageal stricture presents to the ED with complaints of intermittent chest discomfort since 09/18/2019 -Chest pains remind him of his episode back in  2017 when he had MI, chest pain often is relieved with nitro, chest pain often occurs with activity -Over the weekend chest pain was so severe that he thought he might syncopize -Had left upper extremity numbness and tingling associated with this chest pains  -Patient admits to noncompliance with medications apparently ran out of losartan and his insulin so blood sugars and blood pressures have been running high  -Additional history obtained from patient's wife at bedside -Troponin negative -EKG sinus without ACS pattern -Chemistry remarkable for glucose of 368  -No leg swelling or pleuritic symptoms -No significant shortness of breath orthopnea   Hospital Course:    - 1) Chest Pain-prior NSTEMI with angioplasty  and stent placement in October 2017-- cardiovascular risk factors include age, male gender, DM, HLD, history of prior CAD with prior MI and angioplasty with stenting, HTN-    - on  telemetry monitored unit no significant arrhythmias -Patient ruled out for ACS by EKGs and serial troponins  -Nuclear stress test is deemed to be low risk test, EF on gated images 50% -Cardiologist recommends medical management -Patient remains chest pain-free -Discharge on aspirin, nitroglycerin, losartan and Coreg --Echo pending --T cholesterol 232, triglycerides 251, HDL low at 29, LDL 153--- -patient apparently intolerant to multiple statin drugs, discharged on Zetia, need to follow-up with cardiology/lipid clinic as outpatient for possible Repatha -TSH- 1.9  2)DM2-diagnosed in early 2018 about 4 months after his MI, recent A1c above 13 reflecting uncontrolled DM -Run out of insulin recently -Restart Levemir 12 units, -Change Metformin to twice daily   3)HTN-Coreg 3.125 twice daily, losartan 100 mg daily  4)Anxiety disorder--discharged on BuSpar, and Lexapro  Discharge Condition: stable  Follow UP--- PCP and cardiologist as outlined in discharge instructions  Diet and Activity recommendation:  As advised  Discharge Instructions    Discharge Instructions    Call MD for:  persistant dizziness or light-headedness   Complete by: As directed    Call MD for:  persistant nausea and vomiting   Complete by: As directed    Call MD for:  temperature >100.4   Complete by: As directed    Diet - low sodium heart healthy   Complete by: As directed    Diet Carb Modified   Complete by: As directed    Discharge instructions   Complete by: As directed    1)Avoid ibuprofen/Advil/Aleve/Motrin/Goody Powders/Naproxen/BC powders/Meloxicam/Diclofenac/Indomethacin and other Nonsteroidal anti-inflammatory medications as these will make you more likely to bleed and can cause stomach ulcers, can also cause Kidney  problems.   2) please take daily aspirin with food, Coreg and Zetia as well as losartan as prescribed for your heart and blood pressure  3) please follow-up with cardiologist Dr. Domenic Polite for recheck and reevaluation as advised, please discuss possible prescription for Repatha to help lower your cholesterol given your intolerance to statin medications  4) please take Lexapro/Escitalopram and buspirone/BuSpar as prescribed for anxiety and panic attacks-  5) please follow-up with your primary care physician in 2 to 3 weeks for reevaluation and adjustment of your medications  6) low-salt and carbohydrate consistent diet advised to help keep your blood pressure and sugars under control   Increase activity slowly   Complete by: As directed         Discharge Medications     Allergies as of 09/21/2019      Reactions   Insulin Glargine Anaphylaxis   Anaphylaxis    Lipitor [atorvastatin Calcium] Other (See Comments)   Extreme muscle weakness, passing  out, generalized pain all through body   Hydrocodone Itching      Medication List    TAKE these medications   aspirin EC 81 MG tablet Take 1 tablet (81 mg total) by mouth daily with breakfast. What changed:   how much to take  when to take this   busPIRone 10 MG tablet Commonly known as: BUSPAR Take 1 tablet (10 mg total) by mouth 3 (three) times daily. For Anxiety   carvedilol 3.125 MG tablet Commonly known as: COREG Take 1 tablet (3.125 mg total) by mouth 2 (two) times daily with a meal.   escitalopram 10 MG tablet Commonly known as: Lexapro Take 1 tablet (10 mg total) by mouth daily.   ezetimibe 10 MG tablet Commonly known as: ZETIA Take 1 tablet (10 mg total) by mouth daily. Start taking on: September 22, 2019   insulin detemir 100 UNIT/ML injection Commonly known as: LEVEMIR Inject 0.12 mLs (12 Units total) into the skin at bedtime. Please Give 90-day supply What changed: additional instructions   losartan 25 MG  tablet Commonly known as: COZAAR Take 1 tablet (25 mg total) by mouth daily. For anxiety Start taking on: September 22, 2019 What changed:   medication strength  how much to take  additional instructions   metFORMIN 500 MG (MOD) 24 hr tablet Commonly known as: GLUMETZA Take 2 tablets (1,000 mg total) by mouth 2 (two) times daily with a meal. What changed:   medication strength  when to take this   nitroGLYCERIN 0.4 MG SL tablet Commonly known as: NITROSTAT Place 1 tablet (0.4 mg total) under the tongue every 5 (five) minutes as needed for chest pain.   traZODone 50 MG tablet Commonly known as: DESYREL Take 1 tablet (50 mg total) by mouth at bedtime as needed for sleep.       Major procedures and Radiology Reports - PLEASE review detailed and final reports for all details, in brief -   Nm Myocar Multi W/spect W/wall Motion / Ef  Result Date: 09/21/2019  There was no ST segment deviation noted during stress. TWI's seen in inferolateral leads.  Defect 1: There is a medium defect of mild severity present in the mid inferior, apical inferior and apical lateral location. Likely due to soft tissue attenuation vs small region of basal inferior ischemia.  Overall findings support a low risk study.  Nuclear stress EF: 58%.    Dg Chest Portable 1 View  Result Date: 09/20/2019 CLINICAL DATA:  Chest pain EXAM: PORTABLE CHEST 1 VIEW COMPARISON:  May 24, 2018 FINDINGS: Lungs are clear. Heart size and pulmonary vascularity are normal. No adenopathy. No pneumothorax. No bone lesions. IMPRESSION: No edema or consolidation. Electronically Signed   By: Bretta Bang III M.D.   On: 09/20/2019 10:21    Micro Results   Recent Results (from the past 240 hour(s))  SARS CORONAVIRUS 2 (TAT 6-24 HRS) Nasopharyngeal Nasopharyngeal Swab     Status: None   Collection Time: 09/20/19 12:02 PM   Specimen: Nasopharyngeal Swab  Result Value Ref Range Status   SARS Coronavirus 2 NEGATIVE  NEGATIVE Final    Comment: (NOTE) SARS-CoV-2 target nucleic acids are NOT DETECTED. The SARS-CoV-2 RNA is generally detectable in upper and lower respiratory specimens during the acute phase of infection. Negative results do not preclude SARS-CoV-2 infection, do not rule out co-infections with other pathogens, and should not be used as the sole basis for treatment or other patient management decisions. Negative results must be combined  with clinical observations, patient history, and epidemiological information. The expected result is Negative. Fact Sheet for Patients: HairSlick.nohttps://www.fda.gov/media/138098/download Fact Sheet for Healthcare Providers: quierodirigir.comhttps://www.fda.gov/media/138095/download This test is not yet approved or cleared by the [place] FDA and  has been authorized for detection and/or diagnosis of SARS-CoV-2 by FDA under an Emergency Use Authorization (EUA). This EUA will remain  in effect (meaning this test can be used) for the duration of the COVID-19 declaration under Section 56 4(b)(1) of the Act, 21 U.S.C. section 360bbb-3(b)(1), unless the authorization is terminated or revoked sooner. Performed at [facility], [address] [place], [place] [address]        Today   Subjective    Shawn Hughes today has no new complaints -No further chest pains -No shortness of breath, resting comfortably          Patient has been seen and examined prior to discharge   Objective   Blood pressure 111/84, pulse 84, temperature 98.6 F (37 C), temperature source Oral, resp. rate 16, height 5\' 9"  (1.753 m), weight 87.7 kg, SpO2 99 %.   Intake/Output Summary (Last 24 hours) at 09/21/2019 1407 Last data filed at 09/20/2019 2139 Gross per 24 hour  Intake 3 ml  Output -  Net 3 ml   Exam Gen:- Awake Alert, no acute distress  HEENT:- [place].AT, No sclera icterus Neck-Supple Neck,No JVD,.  Lungs-  CTAB , good air movement bilaterally  CV- S1, S2 normal,  regular Abd-  +ve B.Sounds, Abd Soft, No tenderness,    Extremity/Skin:- No  edema,   good pulses Psych-affect is appropriate, oriented x3 Neuro-no new focal deficits, no tremors    Data Review   CBC w Diff:  Lab Results  Component Value Date   WBC 6.2 09/21/2019   HGB 15.7 09/21/2019   HCT 47.1 09/21/2019   PLT 303 09/21/2019   LYMPHOPCT 5 09/01/2016   MONOPCT 3 09/01/2016   EOSPCT 0 09/01/2016   BASOPCT 0 09/01/2016   CMP:  Lab Results  Component Value Date   NA 135 09/21/2019   K 3.8 09/21/2019   CL 105 09/21/2019   CO2 21 (L) 09/21/2019   BUN 16 09/21/2019   CREATININE 0.93 09/21/2019   PROT 6.8 12/14/2016   ALBUMIN 3.7 12/14/2016   BILITOT 0.6 12/14/2016   ALKPHOS 91 12/14/2016   AST 20 12/14/2016   ALT 27 12/14/2016    Total Discharge time is about 33 minutes  Shon Haleourage Trissa Molina M.D on 09/21/2019 at 2:07 PM  Go to [email or web address] -  for contact info  [facility] - Office  [phone]

## 2019-09-21 NOTE — Progress Notes (Addendum)
Progress Note  Patient Name: Shawn Hughes Date of Encounter: 09/21/2019  Primary Cardiologist: Nona DellSamuel McDowell, MD   Subjective   Reports having episodes of shooting pain yesterday evening which lasted until 0100. Resolved with IV Morphine and no recurrent chest pain since. Has been NPO since midnight for stress test.   Inpatient Medications    Scheduled Meds: . aspirin EC  81-162 mg Oral Daily  . atorvastatin  40 mg Oral q1800  . busPIRone  7.5 mg Oral TID  . carvedilol  3.125 mg Oral BID WC  . heparin  5,000 Units Subcutaneous Q8H  . insulin aspart  0-5 Units Subcutaneous QHS  . insulin aspart  0-9 Units Subcutaneous TID WC  . insulin aspart  3 Units Subcutaneous TID WC  . insulin detemir  8 Units Subcutaneous QHS  . losartan  100 mg Oral Daily  . pneumococcal 23 valent vaccine  0.5 mL Intramuscular Tomorrow-1000  . sodium chloride flush  3 mL Intravenous Q12H  . traZODone  50 mg Oral QHS   Continuous Infusions: . sodium chloride     PRN Meds: sodium chloride, acetaminophen **OR** acetaminophen, albuterol, LORazepam, morphine injection, nitroGLYCERIN, ondansetron **OR** ondansetron (ZOFRAN) IV, polyethylene glycol, sodium chloride flush, technetium tetrofosmin   Vital Signs    Vitals:   09/20/19 2055 09/20/19 2111 09/21/19 0453 09/21/19 0744  BP:  (!) 137/105 96/66   Pulse:  81 74   Resp:  20 20   Temp:  98.2 F (36.8 C) 97.8 F (36.6 C)   TempSrc:  Oral Oral   SpO2: 96% 99% 97% 99%  Weight:      Height:        Intake/Output Summary (Last 24 hours) at 09/21/2019 0758 Last data filed at 09/20/2019 2139 Gross per 24 hour  Intake 3 ml  Output -  Net 3 ml    Last 3 Weights 09/20/2019 09/20/2019 05/11/2019  Weight (lbs) 193 lb 4.8 oz 196 lb 195 lb  Weight (kg) 87.68 kg 88.905 kg 88.451 kg      Telemetry    Not reviewed. Patient evaluated in Cardiac Rehab.   ECG    NSR, HR 89 with short PR interval. No acute ST changes when compared to prior  tracings.  - Personally Reviewed  Physical Exam   General: Well developed, well nourished, male appearing in no acute distress. Head: Normocephalic, atraumatic.  Neck: Supple without bruits, JVD not elevated. Lungs:  Resp regular and unlabored, CTA without wheezing or rales. Heart: RRR, S1, S2, no S3, S4, or murmur; no rub. Abdomen: Soft, non-tender, non-distended with normoactive bowel sounds. No hepatomegaly. No rebound/guarding. No obvious abdominal masses. Extremities: No clubbing, cyanosis, or lower extremity edema. Distal pedal pulses are 2+ bilaterally. Neuro: Alert and oriented X 3. Moves all extremities spontaneously. Psych: Normal affect.  Labs    Chemistry Recent Labs  Lab 09/20/19 1003 09/21/19 0555  NA 136 135  K 4.1 3.8  CL 103 105  CO2 20* 21*  GLUCOSE 369* 283*  BUN 15 16  CREATININE 0.70 0.93  CALCIUM 9.5 8.6*  GFRNONAA >60 >60  GFRAA >60 >60  ANIONGAP 13 9     Hematology Recent Labs  Lab 09/20/19 1003 09/21/19 0555  WBC 5.0 6.2  RBC 5.11 4.97  HGB 16.0 15.7  HCT 47.3 47.1  MCV 92.6 94.8  MCH 31.3 31.6  MCHC 33.8 33.3  RDW 12.9 12.8  PLT 320 303    Cardiac EnzymesNo results for input(s): TROPONINI in  the last 168 hours. No results for input(s): TROPIPOC in the last 168 hours.   BNPNo results for input(s): BNP, PROBNP in the last 168 hours.   DDimer No results for input(s): DDIMER in the last 168 hours.   Radiology    Dg Chest Portable 1 View  Result Date: 09/20/2019 CLINICAL DATA:  Chest pain EXAM: PORTABLE CHEST 1 VIEW COMPARISON:  May 24, 2018 FINDINGS: Lungs are clear. Heart size and pulmonary vascularity are normal. No adenopathy. No pneumothorax. No bone lesions. IMPRESSION: No edema or consolidation. Electronically Signed   By: Bretta Bang III M.D.   On: 09/20/2019 10:21    Cardiac Studies   Cardiac Catheterization: 08/2016  Prox RCA lesion, 40 %stenosed.  Mid Cx lesion, 10 %stenosed.  Prox LAD to Mid LAD lesion, 20  %stenosed.  A STENT PROMUS PREM MR 2.5X16 drug eluting stent was successfully placed.  Ramus lesion, 99 %stenosed.  Post intervention, there is a 0% residual stenosis.  There is mild left ventricular systolic dysfunction.  LV end diastolic pressure is mildly elevated.  The left ventricular ejection fraction is 50-55% by visual estimate.  There is no mitral valve regurgitation.   1. NSTEMI secondary to severe, ulcerated stenosis Ramus Intermediate branch 2. Successful PTCA/DES x 1 Ramus Intermediate branch 3. Mild non-obstructive disease in the LAD and RCA 4. The overall LV systolic function is preserved but there is hypokinesis of the anterolateral wall.   Recommendations: Will need DAPT with ASA and Brilinta x 1 year. Continue beta blocker and statin. Echo tomorrow then likely d/c home tomorrow.    Echocardiogram: 08/2016 Study Conclusions  - Left ventricle: The cavity size was normal. Systolic function was   normal. The estimated ejection fraction was in the range of 55%   to 60%.   Patient Profile     53 y.o. male w/ PMH of CAD (s/p DES to RI in 2017), HTN, HLD, Type 2 DM and GERD who presented [facility] ED on 09/20/2019 for evaluation of chest pain.   Assessment & Plan    1. Recurrent Chest Pain with Mixed Typical and Atypical Features - presented with 3 episodes of sharp chest pain which have occurred at rest or with activity but have been relieved by SL NTG. Also reported having worsening anxiety and panic attacks for the past 2-3 weeks.  - initial and repeat troponin values have been negative with EKG showing no acute ischemic changes.  - evaluated in Cardiac Rehab at time of stress test. Developed hypotension with Lexiscan but also received IV Morphine overnight. Official report of stress test pending following stress images.   2. CAD - s/p DES to RI in 08/2016 with mild nonobstructive disease in LAD and RCA.  Has been continued on ASA with Coreg 3.125 mg  twice daily being added.  He has been intolerant to Atorvastatin and Crestor in the past. Was restarted on Atorvastatin 40 mg daily at time of admission but refused given his intolerances in the past.   3. HLD - FLP this admission shows total cholesterol 232, triglycerides 251, HDL 29 and LDL 153. He has been intolerant to Atorvastatin, Crestor (even at low dosing of 5mg ) and Pravastatin the past. He has not tried Zetia and will initiate this. Needs referral to the Lipid Clinic as an outpatient and briefly discussed PCSK-9 inhibitor therapy with the patient at the time of his stress test.   4. HTN - SBP elevated in the 160's yesterday evening, soft at 96/66  this AM following IV Morphine. SBP dropped into the 60's with Lexiscan but quickly improved into the low-100's. Will add hold parameters for Coreg and Losartan (with dose reduction of Losartan for now but could likely restart at PTA dosing at time of discharge).    5. Type 2 DM - Hgb A1c elevated to 13.9 in 2018. No recent repeat labs on file but fasting glucose was 281 this AM. Needs close follow-up with PCP as an outpatient for further management.    For questions or updates, please contact [place] Please consult [email or web address] for contact info under Cardiology/STEMI.   Signed, Erma Heritage , PA-C 7:58 AM 09/21/2019 Pager: [phone]  The patient was seen and examined, and I agree with the history, physical exam, assessment and plan as documented above, with modifications as noted below.  Nuclear stress test results:  There was no ST segment deviation noted during stress. TWI's seen in inferolateral leads.  Defect 1: There is a medium defect of mild severity present in the mid inferior, apical inferior and apical lateral location. Likely due to soft tissue attenuation vs small region of basal inferior ischemia.  Overall findings support a low risk study.  Nuclear stress EF: 58%.    He is feeling much better and  denies chest pain, palpitations, shortness of breath.  Echocardiogram was just completed at the time of my evaluation.  I started carvedilol yesterday.  We will start Zetia today given his statin intolerance.  He will need continued monitoring of his blood pressure.  He was hypertensive yesterday.  I suspect he is having issues with anxiety and panic attacks.    He will need close follow-up with Dr. Domenic Polite.  I have spoken with internal medicine and they plan to discharge him later today.  We will sign off.   Kate Sable, MD, [facility]  09/21/2019 10:27 AM

## 2019-09-21 NOTE — Discharge Instructions (Signed)
1)Avoid ibuprofen/Advil/Aleve/Motrin/Goody Powders/Naproxen/BC powders/Meloxicam/Diclofenac/Indomethacin and other Nonsteroidal anti-inflammatory medications as these will make you more likely to bleed and can cause stomach ulcers, can also cause Kidney problems.   2) please take daily aspirin with food, Coreg and Zetia as well as losartan as prescribed for your heart and blood pressure  3) please follow-up with cardiologist Dr. Domenic Polite for recheck and reevaluation as advised, please discuss possible prescription for Repatha to help lower your cholesterol given your intolerance to statin medications  4) please take Lexapro/Escitalopram and buspirone/BuSpar as prescribed for anxiety and panic attacks-  5) please follow-up with your primary care physician in 2 to 3 weeks for reevaluation and adjustment of your medications  6) low-salt and carbohydrate consistent diet advised to help keep your blood pressure and sugars under control

## 2019-11-03 ENCOUNTER — Ambulatory Visit: Payer: Self-pay

## 2019-11-03 NOTE — Telephone Encounter (Signed)
Wife reports pt. Is positive for COVID 19. 2 days ago developed shortness of breath. Has a dry cough. No fever. Gets short of breath with conversation and exertion. Instructed to go to ED. Reports they will go to UC.  Reason for Disposition . [1] MILD difficulty breathing (e.g., minimal/no SOB at rest, SOB with walking, pulse <100) AND [2] NEW-onset or WORSE than normal  Answer Assessment - Initial Assessment Questions 1. RESPIRATORY STATUS: "Describe your breathing?" (e.g., wheezing, shortness of breath, unable to speak, severe coughing)      Shortness of breath with minimal exertion 2. ONSET: "When did this breathing problem begin?"      Started 2 days ago 3. PATTERN "Does the difficult breathing come and go, or has it been constant since it started?"      Constant 4. SEVERITY: "How bad is your breathing?" (e.g., mild, moderate, severe)    - MILD: No SOB at rest, mild SOB with walking, speaks normally in sentences, can lay down, no retractions, pulse < 100.    - MODERATE: SOB at rest, SOB with minimal exertion and prefers to sit, cannot lie down flat, speaks in phrases, mild retractions, audible wheezing, pulse 100-120.    - SEVERE: Very SOB at rest, speaks in single words, struggling to breathe, sitting hunched forward, retractions, pulse > 120      Mild- moderate 5. RECURRENT SYMPTOM: "Have you had difficulty breathing before?" If so, ask: "When was the last time?" and "What happened that time?"      No 6. CARDIAC HISTORY: "Do you have any history of heart disease?" (e.g., heart attack, angina, bypass surgery, angioplasty)      Yes 7. LUNG HISTORY: "Do you have any history of lung disease?"  (e.g., pulmonary embolus, asthma, emphysema)     No 8. CAUSE: "What do you think is causing the breathing problem?"      COVID 19 9. OTHER SYMPTOMS: "Do you have any other symptoms? (e.g., dizziness, runny nose, cough, chest pain, fever)     Cough - dry 10. PREGNANCY: "Is there any chance you are  pregnant?" "When was your last menstrual period?"       n/a 11. TRAVEL: "Have you traveled out of the country in the last month?" (e.g., travel history, exposures)       No  Protocols used: BREATHING DIFFICULTY-A-AH

## 2019-11-04 ENCOUNTER — Ambulatory Visit: Payer: Self-pay | Admitting: *Deleted

## 2019-11-04 NOTE — Telephone Encounter (Signed)
Paramedic Corena Pilgrim called stating that [place]. The patient tested positive for  covid -66 recently.  Had not had symptoms until now, of fever of 100.6, O2 sat 95, HR 120 and R 20. No respiratory distress . Has slightly altered mental status. But is able to answer questions. Spoke with the patent's wife. And advised her to make sure the patient stay hydrated, get plenty of fluids. Advised to call 911 for respiratory distress.  And to contact his health care provider in the morning.She voiced understanding.

## 2020-01-12 ENCOUNTER — Other Ambulatory Visit: Payer: Self-pay | Admitting: Cardiology

## 2020-01-12 ENCOUNTER — Telehealth: Payer: Self-pay | Admitting: Student

## 2020-01-12 MED ORDER — LOSARTAN POTASSIUM 25 MG PO TABS: 25.0000 mg | ORAL_TABLET | Freq: Every day | ORAL | 4 refills | Status: DC

## 2020-01-12 NOTE — Telephone Encounter (Signed)
Virtual Visit Pre-Appointment Phone Call  "(Name), I am calling you today to discuss your upcoming appointment. We are currently trying to limit exposure to the virus that causes COVID-19 by seeing patients at home rather than in the office."  1. "What is the BEST phone number to call the day of the visit?" [phone]  2. Do you have or have access to (through a family member/friend) a smartphone with video capability that we can use for your visit?" a. If yes - list this number in appt notes as cell (if different from BEST phone #) and list the appointment type as a VIDEO visit in appointment notes b. If no - list the appointment type as a PHONE visit in appointment notes  Confirm consent - "In the setting of the current Covid19 crisis, you are scheduled for a (phone or video) visit with your provider on (date) at (time).  Just as we do with many in-office visits, in order for you to participate in this visit, we must obtain consent.  If you'd like, I can send this to your mychart (if signed up) or email for you to review.  Otherwise, I can obtain your verbal consent now.  All virtual visits are billed to your insurance company just like a normal visit would be.  By agreeing to a virtual visit, we'd like you to understand that the technology does not allow for your provider to perform an examination, and thus may limit your provider's ability to fully assess your condition. If your provider identifies any concerns that need to be evaluated in person, we will make arrangements to do so.  Finally, though the technology is pretty good, we cannot assure that it will always work on either your or our end, and in the setting of a video visit, we may have to convert it to a phone-only visit.  In either situation, we cannot ensure that we have a secure connection.  Are you willing to proceed?" STAFF: Did the patient verbally acknowledge consent to telehealth visit? Document YES/NO here: YES    3. Advise patient to be prepared - "Two hours prior to your appointment, go ahead and check your blood pressure, pulse, oxygen saturation, and your weight (if you have the equipment to check those) and write them all down. When your visit starts, your provider will ask you for this information. If you have an Apple Watch or Kardia device, please plan to have heart rate information ready on the day of your appointment. Please have a pen and paper handy nearby the day of the visit as well."  4. Give patient instructions for MyChart download to smartphone OR Doximity/Doxy.me as below if video visit (depending on what platform provider is using)  5. Inform patient they will receive a phone call 15 minutes prior to their appointment time (may be from unknown caller ID) so they should be prepared to answer    TELEPHONE CALL NOTE  Shawn Hughes has been deemed a candidate for a follow-up tele-health visit to limit community exposure during the Covid-19 pandemic. I spoke with the patient via phone to ensure availability of phone/video source, confirm preferred email & phone number, and discuss instructions and expectations.  I reminded Shawn Hughes to be prepared with any vital sign and/or heart rhythm information that could potentially be obtained via home monitoring, at the time of his visit. I reminded Shawn Hughes to expect a phone call prior to his visit.  Vicky T  Slaughter 01/12/2020 9:52 AM   INSTRUCTIONS FOR DOWNLOADING THE MYCHART APP TO SMARTPHONE  - The patient must first make sure to have activated MyChart and know their login information - If Apple, go to [employer] and type in MyChart in the search bar and download the app. If Android, ask patient to go to [employer] and type in [place] in the search bar and download the app. The app is free but as with any other app downloads, their phone may require them to verify saved payment information or Apple/Android password.   - The patient will need to then log into the app with their MyChart username and password, and select [facility] as their healthcare provider to link the account. When it is time for your visit, go to the MyChart app, find appointments, and click Begin Video Visit. Be sure to Select Allow for your device to access the Microphone and Camera for your visit. You will then be connected, and your provider will be with you shortly.  **If they have any issues connecting, or need assistance please contact MyChart service desk [phone])**  **If using a computer, in order to ensure the best quality for their visit they will need to use either of the following Internet Browsers: [employer], or Google Chrome**  IF USING DOXIMITY or DOXY.ME - The patient will receive a link just prior to their visit by text.     FULL LENGTH CONSENT FOR TELE-HEALTH VISIT   I hereby voluntarily request, consent and authorize [facility] and its employed or contracted physicians, physician assistants, nurse practitioners or other licensed health care professionals (the Practitioner), to provide me with telemedicine health care services (the Services") as deemed necessary by the treating Practitioner. I acknowledge and consent to receive the Services by the Practitioner via telemedicine. I understand that the telemedicine visit will involve communicating with the Practitioner through live audiovisual communication technology and the disclosure of certain medical information by electronic transmission. I acknowledge that I have been given the opportunity to request an in-person assessment or other available alternative prior to the telemedicine visit and am voluntarily participating in the telemedicine visit.  I understand that I have the right to withhold or withdraw my consent to the use of telemedicine in the course of my care at any time, without affecting my right to future care or treatment, and that  the Practitioner or I may terminate the telemedicine visit at any time. I understand that I have the right to inspect all information obtained and/or recorded in the course of the telemedicine visit and may receive copies of available information for a reasonable fee.  I understand that some of the potential risks of receiving the Services via telemedicine include:   Delay or interruption in medical evaluation due to technological equipment failure or disruption;  Information transmitted may not be sufficient (e.g. poor resolution of images) to allow for appropriate medical decision making by the Practitioner; and/or   In rare instances, security protocols could fail, causing a breach of personal health information.  Furthermore, I acknowledge that it is my responsibility to provide information about my medical history, conditions and care that is complete and accurate to the best of my ability. I acknowledge that Practitioner's advice, recommendations, and/or decision may be based on factors not within their control, such as incomplete or inaccurate data provided by me or distortions of diagnostic images or specimens that may result from electronic transmissions. I understand that the practice of  medicine is not an Chief Strategy Officer and that Practitioner makes no warranties or guarantees regarding treatment outcomes. I acknowledge that I will receive a copy of this consent concurrently upon execution via email to the email address I last provided but may also request a printed copy by calling the office of [facility].    I understand that my insurance will be billed for this visit.   I have read or had this consent read to me.  I understand the contents of this consent, which adequately explains the benefits and risks of the Services being provided via telemedicine.   I have been provided ample opportunity to ask questions regarding this consent and the Services and have had my questions answered to  my satisfaction.  I give my informed consent for the services to be provided through the use of telemedicine in my medical care  By participating in this telemedicine visit I agree to the above.

## 2020-01-12 NOTE — Telephone Encounter (Signed)
SENT REFILL 

## 2020-01-12 NOTE — Telephone Encounter (Signed)
     1. Which medications need to be refilled? (please list name of each medication and dose if known)  losartan (COZAAR) 25 MG tablet    2. Which pharmacy/location (including street and city if local pharmacy) is medication to be sent to?   [facility]   [address] [place]   3. Do they need a 30 day or 90 day supply?   Patient has 3 pills left

## 2020-01-13 ENCOUNTER — Encounter: Payer: Self-pay | Admitting: *Deleted

## 2020-01-13 ENCOUNTER — Telehealth (INDEPENDENT_AMBULATORY_CARE_PROVIDER_SITE_OTHER): Payer: PRIVATE HEALTH INSURANCE | Admitting: Student

## 2020-01-13 ENCOUNTER — Encounter: Payer: Self-pay | Admitting: Student

## 2020-01-13 ENCOUNTER — Telehealth: Payer: PRIVATE HEALTH INSURANCE | Admitting: Cardiology

## 2020-01-13 VITALS — BP 138/84 | HR 110 | Ht 69.0 in | Wt 193.0 lb

## 2020-01-13 DIAGNOSIS — I10 Essential (primary) hypertension: Secondary | ICD-10-CM | POA: Diagnosis not present

## 2020-01-13 DIAGNOSIS — E119 Type 2 diabetes mellitus without complications: Secondary | ICD-10-CM

## 2020-01-13 DIAGNOSIS — Z794 Long term (current) use of insulin: Secondary | ICD-10-CM

## 2020-01-13 DIAGNOSIS — E785 Hyperlipidemia, unspecified: Secondary | ICD-10-CM

## 2020-01-13 DIAGNOSIS — I2510 Atherosclerotic heart disease of native coronary artery without angina pectoris: Secondary | ICD-10-CM

## 2020-01-13 DIAGNOSIS — E1169 Type 2 diabetes mellitus with other specified complication: Secondary | ICD-10-CM

## 2020-01-13 NOTE — Progress Notes (Addendum)
Virtual Visit via Telephone Note   This visit type was conducted due to national recommendations for restrictions regarding the COVID-19 Pandemic (e.g. social distancing) in an effort to limit this patient's exposure and mitigate transmission in our community.  Due to his co-morbid illnesses, this patient is at least at moderate risk for complications without adequate follow up.  This format is felt to be most appropriate for this patient at this time.  The patient did not have access to video technology/had technical difficulties with video requiring transitioning to audio format only (telephone).  All issues noted in this document were discussed and addressed.  No physical exam could be performed with this format.  Please refer to the patient's chart for his  consent to telehealth for [facility].   Date:  01/13/2020   ID:  Shawn Hughes, DOB [birth year], MRN [id number]  Patient Location: Home Provider Location: Office  PCP: Dr. Charmaine Downs - [facility] Cardiologist:  Rozann Lesches, MD  Electrophysiologist:  None   Evaluation Performed:  Follow-Up Visit  Chief Complaint: 6 month visit  History of Present Illness:    Shawn Hughes is a 54 y.o. male with past medical history of CAD (s/p DES to RI in 2017), HTN, HLD, Type 2 DM and GERD who presents for a 67-month follow-up telehealth visit.  He was last examined by the cardiology service during an admission in 08/2019 for chest pain which he had described as a sharp discomfort which could occur at rest or with activity for the past several days. Troponin values were negative and his EKG showed no acute ischemic changes. Echocardiogram showed a preserved EF of 55 to 60% with no regional wall motion abnormalities. He did undergo stress testing which showed a medium defect of mild severity in the inferior, apical inferior and apical lateral location which was thought to be most consistent with soft tissue attenuation or a very small region  of basal inferior ischemia. The study was overall low-risk. He was continued on ASA and beta-blocker therapy. Given his statin intolerances, he was started on Zetia 10 mg daily.  In talking with the patient today, he reports overall doing well since his last visit. 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 He reports he was under increased stress at his prior job and feels like this played a role in his symptoms last year. He denies any recurrent chest pain or dyspnea on exertion. He is active at school and also in doing chores around the house and denies any recurrent anginal symptoms. No recent orthopnea, PND, lower extremity edema or palpitations.  He reports still having myalgias, even with Zetia.   HR was in the 110's when checked earlier today and he reports his baseline varies from the 90's to 110's and has done so for 20+ years. Asymptomatic with this.   The patient does not have symptoms concerning for COVID-19 infection (fever, chills, cough, or new shortness of breath).    Past Medical History:  Diagnosis Date  . CAD (coronary artery disease)    a. 08/2016: cath showing 40% prox RCA, 10% LCx, 20% LAD and 99% RI stenosis. DES to RI.  Marland Kitchen GERD (gastroesophageal reflux disease)   . History of nephrolithiasis   . HLD (hyperlipidemia)   . Hypertension   . Type 2 diabetes mellitus ([place])    Past Surgical History:  Procedure Laterality Date  . CARDIAC CATHETERIZATION N/A 08/30/2016  Procedure: Left Heart Cath and Coronary Angiography;  Surgeon: Kathleene Hazel, MD;  [facility] CV LAB;  Service: Cardiovascular;  Laterality: N/A;  . CARDIAC CATHETERIZATION N/A 08/30/2016   Procedure: Coronary Stent Intervention;  Surgeon: Kathleene Hazel, MD;  Location: [facility] INVASIVE CV LAB;  Service: Cardiovascular;  Laterality: N/A;  . ESOPHAGOGASTRODUODENOSCOPY  01/10/2012   [id number] in the distal esophagus2O TO GERD/Mild  gastritis  . LACERATION REPAIR     Right arm     Current Meds  Medication Sig  . aspirin EC 81 MG tablet Take 1 tablet (81 mg total) by mouth daily with breakfast.  . busPIRone (BUSPAR) 10 MG tablet Take 1 tablet (10 mg total) by mouth 3 (three) times daily. For Anxiety  . carvedilol (COREG) 3.125 MG tablet Take 1 tablet (3.125 mg total) by mouth 2 (two) times daily with a meal.  . ezetimibe (ZETIA) 10 MG tablet Take 1 tablet (10 mg total) by mouth daily.  . insulin detemir (LEVEMIR) 100 UNIT/ML injection Inject 0.12 mLs (12 Units total) into the skin at bedtime. Please Give 90-day supply (Patient taking differently: Inject 33 Units into the skin at bedtime. Please Give 90-day supply)  . losartan (COZAAR) 25 MG tablet Take 1 tablet (25 mg total) by mouth daily. For anxiety  . metFORMIN (GLUMETZA) 500 MG (MOD) 24 hr tablet Take 2 tablets (1,000 mg total) by mouth 2 (two) times daily with a meal.  . nitroGLYCERIN (NITROSTAT) 0.4 MG SL tablet Place 1 tablet (0.4 mg total) under the tongue every 5 (five) minutes as needed for chest pain.     Allergies:   Insulin glargine, Lipitor [atorvastatin calcium], and Hydrocodone   Social History   Tobacco Use  . Smoking status: Former Smoker    Packs/day: 0.00    Years: 15.00    Pack years: 0.00    Types: Cigarettes    Start date: 09/11/1983    Quit date: 08/30/2016    Years since quitting: 3.3  . Smokeless tobacco: Never Used  Substance Use Topics  . Alcohol use: Not Currently    Comment: occasional  . Drug use: No     Family Hx: The patient's family history includes CAD (age of onset: 15) in his father; CAD (age of onset: 17) in his mother; Hypertension in his brother. There is no history of Colon cancer, Colon polyps, Esophageal cancer, Pancreatic cancer, or Stomach cancer.  ROS:   Please see the history of present illness.     All other systems reviewed and are negative.   Prior CV studies:   The following studies were reviewed  today:  Cardiac Catheterization: 08/2016  Prox RCA lesion, 40 %stenosed.  Mid Cx lesion, 10 %stenosed.  Prox LAD to Mid LAD lesion, 20 %stenosed.  A STENT PROMUS PREM MR 2.5X16 drug eluting stent was successfully placed.  Ramus lesion, 99 %stenosed.  Post intervention, there is a 0% residual stenosis.  There is mild left ventricular systolic dysfunction.  LV end diastolic pressure is mildly elevated.  The left ventricular ejection fraction is 50-55% by visual estimate.  There is no mitral valve regurgitation.   1. NSTEMI secondary to severe, ulcerated stenosis Ramus Intermediate branch 2. Successful PTCA/DES x 1 Ramus Intermediate branch 3. Mild non-obstructive disease in the LAD and RCA 4. The overall LV systolic function is preserved but there is hypokinesis of the anterolateral wall.   Recommendations: Will need DAPT with ASA and Brilinta x 1 year. Continue beta blocker and statin.  Echo tomorrow then likely d/c home tomorrow.   Echocardiogram: 08/2019 IMPRESSIONS    1. Left ventricular ejection fraction, by visual estimation, is 60 to  65%. The left ventricle has normal function. There is no left ventricular  hypertrophy.  2. Global right ventricle has normal systolic function.The right  ventricular size is normal. No increase in right ventricular wall  thickness.  3. Left atrial size was normal.  4. Right atrial size was normal.  5. The mitral valve is grossly normal. No evidence of mitral valve  regurgitation.  6. The tricuspid valve is grossly normal. Tricuspid valve regurgitation  is mild.  7. The aortic valve is tricuspid Aortic valve regurgitation was not  visualized by color flow Doppler. Structurally normal aortic valve, with  no evidence of sclerosis or stenosis.  8. The pulmonic valve was grossly normal. Pulmonic valve regurgitation is  not visualized by color flow Doppler.  9. Normal pulmonary artery systolic pressure.  10. The inferior  vena cava is normal in size with greater than 50%  respiratory variability, suggesting right atrial pressure of 3 mmHg.  NST: 08/2019  There was no ST segment deviation noted during stress. TWI's seen in inferolateral leads.  Defect 1: There is a medium defect of mild severity present in the mid inferior, apical inferior and apical lateral location. Likely due to soft tissue attenuation vs small region of basal inferior ischemia.  Overall findings support a low risk study.  Nuclear stress EF: 58%.  Labs/Other Tests and Data Reviewed:    EKG:  An ECG dated 09/20/2019 was personally reviewed today and demonstrated:  NSR, HR 89 with no acute ST abnormalities when compared to prior tracings.   Recent Labs: 09/21/2019: BUN 16; Creatinine, Ser 0.93; Hemoglobin 15.7; Platelets 303; Potassium 3.8; Sodium 135; TSH 1.925   Recent Lipid Panel Lab Results  Component Value Date/Time   CHOL 232 (H) 09/21/2019 05:55 AM   TRIG 251 (H) 09/21/2019 05:55 AM   HDL 29 (L) 09/21/2019 05:55 AM   CHOLHDL 8.0 09/21/2019 05:55 AM   LDLCALC 153 (H) 09/21/2019 05:55 AM    Wt Readings from Last 3 Encounters:  01/13/20 193 lb (87.5 kg)  09/20/19 193 lb 4.8 oz (87.7 kg)  05/11/19 195 lb (88.5 kg)     Objective:    Vital Signs:  BP 138/84   Pulse (!) 110   Ht 5\' 9"  (1.753 m)   Wt 193 lb (87.5 kg)   BMI 28.50 kg/m    General: Pleasant male sounding in NAD Psych: Normal affect. Neuro: Alert and oriented X 3. Lungs:  Resp regular and unlabored while talking on the phone.    ASSESSMENT & PLAN:    1. CAD - He is s/p DES to RI in 2017 as outlined above and evaluation in 08/2019 was reassuring as his echocardiogram showed a preserved EF with no wall motion abnormalities and NST did show a small defect which was thought to be soft tissue attenuation or a very small region of ischemia but was overall a low-risk study. - He denies any recurrent chest pain or dyspnea on exertion since his  hospitalization. 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 - Continue ASA 81 mg daily, Coreg 3.125 mg twice daily and Zetia with plans to refer to the Lipid clinic as outlined below.  2. HLD - LDL was elevated to 153 in 08/2019 and he was started on Zetia  10 mg daily. He reports still having myalgias with this and I informed him he could try reducing this to 5 mg daily to see if symptoms improved. He has previously been intolerant to Atorvastatin, Crestor (tried this at various doses including 5 mg daily) and Pravastatin. Will request a copy of his most recent labs from his PCP but if LDL remains significantly above goal, would recommend initiation of PCSK9 inhibitor therapy. I discussed this with him today and will refer to the Lipid Clinic.  3. HTN - BP was well-controlled at 138/84 on most recent check. He remains on Coreg 3.125 mg twice daily and Losartan 25 mg daily.  4. Type 2 DM - he reports his Hgb A1c was previously greater than 14.0, improved to ~ 11.0 on most recent check. Followed by PCP. Will request a copy of his most recent labs.    COVID-19 Education: The signs and symptoms of COVID-19 were discussed with the patient and how to seek care for testing (follow up with PCP or arrange E-visit).  The importance of social distancing was discussed today.  Time:   Today, I have spent 24 minutes with the patient with telehealth technology discussing the above problems.     Medication Adjustments/Labs and Tests Ordered: Current medicines are reviewed at length with the patient today.  Concerns regarding medicines are outlined above.   Tests Ordered: No orders of the defined types were placed in this encounter.   Medication Changes: No orders of the defined types were placed in this encounter.   Follow Up:  In Person in 6 month(s)  Signed, Ellsworth Lennox, Cordelia Poche  01/13/2020 11:52 AM    [facility]

## 2020-01-13 NOTE — Patient Instructions (Addendum)
Medication Instructions:  Your physician recommends that you continue on your current medications as directed. Please refer to the Current Medication list given to you today.   Labwork: I have requested labs from your PCP    Testing/Procedures: NONE   Follow-Up: Your physician wants you to follow-up in: 6 Months with Dr. Diona Browner. You will receive a reminder letter in the mail two months in advance. If you don't receive a letter, please call our office to schedule the follow-up appointment.   Any Other Special Instructions Will Be Listed Below (If Applicable). 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  Thank you for choosing [facility]!

## 2020-01-18 ENCOUNTER — Telehealth: Payer: Self-pay

## 2020-01-18 NOTE — Telephone Encounter (Signed)
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

## 2020-01-18 NOTE — Telephone Encounter (Signed)
Returned pt call. 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

## 2020-02-15 ENCOUNTER — Ambulatory Visit: Payer: PRIVATE HEALTH INSURANCE

## 2020-02-15 NOTE — Progress Notes (Deleted)
Patient ID: Shawn Hughes                 DOB: Jul 04, 1966                    MRN: [id number]     HPI: Shawn Hughes is  a 54 y.o. male patient of Shawn Hughes  referred to lipid clinic by B   PMH is significant for   Current Medications:  Intolerances:  Risk Factors:  LDL goal:   Diet:   Exercise:   Family History: The patient's family history includes CAD (age of onset: 14) in his father; CAD (age of onset: 20) in his mother; Hypertension in his brother. There is no history of Colon cancer, Colon polyps, Esophageal cancer, Pancreatic cancer, or Stomach cancer.  Social History: former smoker, occasional alcohol   Labs:  Past Medical History:  Diagnosis Date  . CAD (coronary artery disease)    a. 08/2016: cath showing 40% prox RCA, 10% LCx, 20% LAD and 99% RI stenosis. DES to RI.  Marland Kitchen GERD (gastroesophageal reflux disease)   . History of nephrolithiasis   . HLD (hyperlipidemia)   . Hypertension   . Type 2 diabetes mellitus (HCC)     Current Outpatient Medications on File Prior to Visit  Medication Sig Dispense Refill  . aspirin EC 81 MG tablet Take 1 tablet (81 mg total) by mouth daily with breakfast. 30 tablet 3  . busPIRone (BUSPAR) 10 MG tablet Take 1 tablet (10 mg total) by mouth 3 (three) times daily. For Anxiety 90 tablet 5  . carvedilol (COREG) 3.125 MG tablet Take 1 tablet (3.125 mg total) by mouth 2 (two) times daily with a meal. 60 tablet 4  . ezetimibe (ZETIA) 10 MG tablet Take 1 tablet (10 mg total) by mouth daily. 30 tablet 3  . insulin detemir (LEVEMIR) 100 UNIT/ML injection Inject 0.12 mLs (12 Units total) into the skin at bedtime. Please Give 90-day supply (Patient taking differently: Inject 33 Units into the skin at bedtime. Please Give 90-day supply) 10 mL 3  . losartan (COZAAR) 25 MG tablet Take 1 tablet (25 mg total) by mouth daily. For anxiety 30 tablet 4  . metFORMIN (GLUMETZA) 500 MG (MOD) 24 hr tablet Take 2 tablets (1,000 mg total) by mouth 2 (two) times daily  with a meal. 120 tablet 5  . nitroGLYCERIN (NITROSTAT) 0.4 MG SL tablet Place 1 tablet (0.4 mg total) under the tongue every 5 (five) minutes as needed for chest pain. 30 tablet 4   No current facility-administered medications on file prior to visit.    Allergies  Allergen Reactions  . Insulin Glargine Anaphylaxis    Anaphylaxis   . Lipitor [Atorvastatin Calcium] Other (See Comments)    Extreme muscle weakness, passing out, generalized pain all through body  . Hydrocodone Itching    No problem-specific Assessment & Plan notes found for this encounter.      Amaia Lavallie Rodriguez-Guzman PharmD, BCPS, CPP [facility] [address] [place] [address] 02/15/2020 8:09 AM

## 2020-03-01 ENCOUNTER — Encounter: Payer: Self-pay | Admitting: General Practice

## 2020-03-14 ENCOUNTER — Telehealth: Payer: Self-pay | Admitting: Cardiology

## 2020-03-14 MED ORDER — CARVEDILOL 3.125 MG PO TABS: 3.1250 mg | ORAL_TABLET | Freq: Two times a day (BID) | ORAL | 4 refills | Status: DC

## 2020-03-14 NOTE — Telephone Encounter (Signed)
DONE

## 2020-03-14 NOTE — Telephone Encounter (Signed)
Needing new Rx for carvedilol (COREG) 3.125 MG tablet [[id number]]  Sent to [facility] in [place] w/ Dr. Diona Browner name- pt was put on this medication while in the hospital

## 2020-06-08 ENCOUNTER — Other Ambulatory Visit: Payer: Self-pay | Admitting: Cardiology

## 2020-07-05 ENCOUNTER — Other Ambulatory Visit: Payer: Self-pay | Admitting: Orthopaedic Surgery

## 2020-07-05 DIAGNOSIS — G8929 Other chronic pain: Secondary | ICD-10-CM

## 2020-07-26 ENCOUNTER — Other Ambulatory Visit: Payer: Self-pay

## 2020-07-26 ENCOUNTER — Encounter: Payer: Self-pay | Admitting: Cardiology

## 2020-07-26 ENCOUNTER — Ambulatory Visit (INDEPENDENT_AMBULATORY_CARE_PROVIDER_SITE_OTHER): Payer: Commercial Managed Care - PPO | Admitting: Cardiology

## 2020-07-26 VITALS — BP 146/90 | HR 92 | Ht 69.0 in | Wt 194.0 lb

## 2020-07-26 DIAGNOSIS — I25119 Atherosclerotic heart disease of native coronary artery with unspecified angina pectoris: Secondary | ICD-10-CM | POA: Diagnosis not present

## 2020-07-26 DIAGNOSIS — Z789 Other specified health status: Secondary | ICD-10-CM

## 2020-07-26 DIAGNOSIS — E782 Mixed hyperlipidemia: Secondary | ICD-10-CM | POA: Diagnosis not present

## 2020-07-26 MED ORDER — NITROGLYCERIN 0.4 MG SL SUBL: 0.4000 mg | SUBLINGUAL_TABLET | SUBLINGUAL | 4 refills | Status: DC | PRN

## 2020-07-26 MED ORDER — METOPROLOL SUCCINATE ER 25 MG PO TB24: 12.5000 mg | ORAL_TABLET | Freq: Every day | ORAL | 3 refills | Status: DC

## 2020-07-26 NOTE — Progress Notes (Signed)
Cardiology Office Note  Date: 07/26/2020   ID: [name], DOB 03-10-1966, MRN [id number]  PCP:  Shawn Hughes [facility]) Cardiologist:  Nona Dell, MD Electrophysiologist:  None   Chief Complaint  Patient presents with  . Cardiac follow-up    History of Present Illness: Shawn Hughes is a 54 y.o. male last assessed via telehealth encounter in February by Ms. Strader PA-C.  He presents for a routine follow-up visit.  Reports no obvious angina symptoms or nitroglycerin use.  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  Reports less stress.  He states that he is following with a provider in [place] for control of his diabetes.  We went over his medications.  He has not been consistent with Zetia, states that it makes him feel fatigued.  He also has intolerances to Lipitor, Crestor, and Pravachol.   Past Medical History:  Diagnosis Date  . CAD (coronary artery disease)    a. 08/2016: cath showing 40% prox RCA, 10% LCx, 20% LAD and 99% RI stenosis. DES to RI.  Marland Kitchen COVID-14 October 2019 - was not hospitalized  . Essential hypertension   . GERD (gastroesophageal reflux disease)   . History of nephrolithiasis   . Mixed hyperlipidemia   . Type 2 diabetes mellitus (HCC)     Past Surgical History:  Procedure Laterality Date  . CARDIAC CATHETERIZATION N/A 08/30/2016   Procedure: Left Heart Cath and Coronary Angiography;  Surgeon: Kathleene Hazel, MD;  Location: [facility] INVASIVE CV LAB;  Service: Cardiovascular;  Laterality: N/A;  . CARDIAC CATHETERIZATION N/A 08/30/2016   Procedure: Coronary Stent Intervention;  Surgeon: Kathleene Hazel, MD;  Location: [facility] INVASIVE CV LAB;  Service: Cardiovascular;  Laterality: N/A;  . ESOPHAGOGASTRODUODENOSCOPY  01/10/2012   HUD:[id number] in the distal esophagus2O TO GERD/Mild gastritis  . LACERATION REPAIR     Right arm    Current Outpatient Medications  Medication Sig Dispense Refill  .  aspirin EC 81 MG tablet Take 1 tablet (81 mg total) by mouth daily with breakfast. 30 tablet 3  . ezetimibe (ZETIA) 10 MG tablet Take 1 tablet (10 mg total) by mouth daily. 30 tablet 3  . insulin detemir (LEVEMIR) 100 UNIT/ML injection Inject 0.12 mLs (12 Units total) into the skin at bedtime. Please Give 90-day supply (Patient taking differently: Inject 42 Units into the skin at bedtime. Please Give 90-day supply) 10 mL 3  . losartan (COZAAR) 25 MG tablet Take 1 tablet (25 mg total) by mouth daily. For anxiety 30 tablet 4  . metFORMIN (GLUMETZA) 500 MG (MOD) 24 hr tablet Take 2 tablets (1,000 mg total) by mouth 2 (two) times daily with a meal. 120 tablet 5  . nitroGLYCERIN (NITROSTAT) 0.4 MG SL tablet Place 1 tablet (0.4 mg total) under the tongue every 5 (five) minutes as needed for chest pain. 30 tablet 4  . metoprolol succinate (TOPROL XL) 25 MG 24 hr tablet Take 0.5 tablets (12.5 mg total) by mouth daily. 45 tablet 3   No current facility-administered medications for this visit.   Allergies:  Insulin glargine, Lipitor [atorvastatin calcium], and Hydrocodone   ROS:   No palpitations or syncope.  Physical Exam: VS:  BP (!) 146/90   Pulse 92   Ht 5\' 9"  (1.753 m)   Wt 194 lb (88 kg)   SpO2 97%   BMI 28.65 kg/m , BMI Body mass index is 28.65 kg/m.  Wt Readings from  Last 3 Encounters:  07/26/20 194 lb (88 kg)  01/13/20 193 lb (87.5 kg)  09/20/19 193 lb 4.8 oz (87.7 kg)    General: Patient appears comfortable at rest. HEENT: Conjunctiva and lids normal, wearing a mask. Neck: Supple, no elevated JVP or carotid bruits, no thyromegaly. Lungs: Clear to auscultation, nonlabored breathing at rest. Cardiac: Regular rate and rhythm, no S3 or significant systolic murmur, no pericardial rub. Extremities: No pitting edema, distal pulses 2+.  ECG:  An ECG dated September 20, 2019 was personally reviewed today and demonstrated:  Sinus rhythm with short PR interval, no delta waves.  Recent  Labwork: 09/21/2019: BUN 16; Creatinine, Ser 0.93; Hemoglobin 15.7; Platelets 303; Potassium 3.8; Sodium 135; TSH 1.925     Component Value Date/Time   CHOL 232 (H) 09/21/2019 0555   TRIG 251 (H) 09/21/2019 0555   HDL 29 (L) 09/21/2019 0555   CHOLHDL 8.0 09/21/2019 0555   VLDL 50 (H) 09/21/2019 0555   LDLCALC 153 (H) 09/21/2019 0555    Other Studies Reviewed Today:  Steffanie Dunn September 21, 2019:  There was no ST segment deviation noted during stress. TWI's seen in inferolateral leads.  Defect 1: There is a medium defect of mild severity present in the mid inferior, apical inferior and apical lateral location. Likely due to soft tissue attenuation vs small region of basal inferior ischemia.  Overall findings support a low risk study.  Nuclear stress EF: 58%.  Echocardiogram September 21, 2019: 1. Left ventricular ejection fraction, by visual estimation, is 60 to  65%. The left ventricle has normal function. There is no left ventricular  hypertrophy.  2. Global right ventricle has normal systolic function.The right  ventricular size is normal. No increase in right ventricular wall  thickness.  3. Left atrial size was normal.  4. Right atrial size was normal.  5. The mitral valve is grossly normal. No evidence of mitral valve  regurgitation.  6. The tricuspid valve is grossly normal. Tricuspid valve regurgitation  is mild.  7. The aortic valve is tricuspid Aortic valve regurgitation was not  visualized by color flow Doppler. Structurally normal aortic valve, with  no evidence of sclerosis or stenosis.  8. The pulmonic valve was grossly normal. Pulmonic valve regurgitation is  not visualized by color flow Doppler.  9. Normal pulmonary artery systolic pressure.  10. The inferior vena cava is normal in size with greater than 50%  respiratory variability, suggesting right atrial pressure of 3 mmHg.   Assessment and Plan:  1.  CAD status post DES to the ramus  intermedius in 2017.  Follow-up cardiac structural and ischemic testing from October of last year was reassuring as outlined above, LVEF 60 to 65%, and low risk Myoview.  Continue medical therapy including paren, Cozaar, also starting low-dose Toprol-XL 12.5 mg daily.  Refill provided for nitroglycerin.  2.  Mixed hyperlipidemia with multistatin intolerance as discussed above, also reporting issues with Zetia.  We will check with the lipid clinic regarding his candidacy for PCSK9 inhibitor and insurance coverage.  Medication Adjustments/Labs and Tests Ordered: Current medicines are reviewed at length with the patient today.  Concerns regarding medicines are outlined above.   Tests Ordered: No orders of the defined types were placed in this encounter.   Medication Changes: Meds ordered this encounter  Medications  . nitroGLYCERIN (NITROSTAT) 0.4 MG SL tablet    Sig: Place 1 tablet (0.4 mg total) under the tongue every 5 (five) minutes as needed for chest pain.  Dispense:  30 tablet    Refill:  4  . metoprolol succinate (TOPROL XL) 25 MG 24 hr tablet    Sig: Take 0.5 tablets (12.5 mg total) by mouth daily.    Dispense:  45 tablet    Refill:  3    Disposition:  Follow up 6 months with [place] in the [place] office.  Signed, Jonelle Sidle, MD, [facility] 07/26/2020 12:06 PM    [facility] at [facility] [address], [place], [place] [address] Phone: [phone]; Fax: [phone]

## 2020-07-26 NOTE — Patient Instructions (Signed)
Medication Instructions:  START Toprol XL 12.5 mg daily   *If you need a refill on your cardiac medications before your next appointment, please call your pharmacy*   Lab Work: None today If you have labs (blood work) drawn today and your tests are completely normal, you will receive your results only by: Marland Kitchen MyChart Message (if you have MyChart) OR . A paper copy in the mail If you have any lab test that is abnormal or we need to change your treatment, we will call you to review the results.   Testing/Procedures: None today   Follow-Up: At [facility], you and your health needs are our priority.  As part of our continuing mission to provide you with exceptional heart care, we have created designated Provider Care Teams.  These Care Teams include your primary Cardiologist (physician) and Advanced Practice Providers (APPs -  Physician Assistants and Nurse Practitioners) who all work together to provide you with the care you need, when you need it.  We recommend signing up for the patient portal called "MyChart".  Sign up information is provided on this After Visit Summary.  MyChart is used to connect with patients for Virtual Visits (Telemedicine).  Patients are able to view lab/test results, encounter notes, upcoming appointments, etc.  Non-urgent messages can be sent to your provider as well.   To learn more about what you can do with MyChart, go to [email or web address].    Your next appointment:   6 month(s)  The format for your next appointment:   In Person  Provider:   Randall An, PA-C   Other Instructions None       Thank you for choosing [facility] !

## 2020-07-27 ENCOUNTER — Telehealth: Payer: Self-pay

## 2020-07-27 DIAGNOSIS — E782 Mixed hyperlipidemia: Secondary | ICD-10-CM

## 2020-07-27 NOTE — Telephone Encounter (Signed)
Patient will stop by tomorrow to get lab slip and have lipid drawn

## 2020-07-27 NOTE — Telephone Encounter (Signed)
-----   Message from Pearletha Furl, RPH-CPP sent at 07/27/2020  8:31 AM EDT ----- Regarding: RE: evalute pt for PCSK9 We need a recent lipid panel (last one in epic is 52 year old) once we received new blood work ,  I can forward the information to my CMA to complete PA process. He will need teaching to use device and co-pay card. He can get his own co-pay card online if prefer.   Raquel Rodriguez-Guzman PharmD, BCPS, CPP [facility] [address] [place] [address] 07/27/2020 8:37 AM ----- Message ----- From: Nori Riis, RN Sent: 07/26/2020  12:07 PM EDT To: Pearletha Furl, RPH-CPP Subject: evalute pt for PCSK9                           Hello, Dr.McDowell asks if you could see if patient is a candidate for pcsk9 inhibitors and if his insurance will pay.See his office note from today.He has failed everything else.We had put in a referral before but he doesn't think he will drive to [place].  Thanks, [employer]

## 2021-10-11 ENCOUNTER — Other Ambulatory Visit: Payer: Self-pay

## 2021-10-11 ENCOUNTER — Emergency Department (HOSPITAL_COMMUNITY): Payer: Commercial Managed Care - PPO

## 2021-10-11 ENCOUNTER — Encounter (HOSPITAL_COMMUNITY): Payer: Self-pay | Admitting: Emergency Medicine

## 2021-10-11 ENCOUNTER — Observation Stay (HOSPITAL_BASED_OUTPATIENT_CLINIC_OR_DEPARTMENT_OTHER): Payer: Commercial Managed Care - PPO

## 2021-10-11 ENCOUNTER — Observation Stay (HOSPITAL_COMMUNITY)
Admission: EM | Admit: 2021-10-11 | Discharge: 2021-10-13 | Disposition: A | Discharge status: Home / Self Care | Payer: Commercial Managed Care - PPO | Attending: Internal Medicine | Admitting: Internal Medicine

## 2021-10-11 DIAGNOSIS — Z794 Long term (current) use of insulin: Secondary | ICD-10-CM | POA: Insufficient documentation

## 2021-10-11 DIAGNOSIS — Z8616 Personal history of COVID-19: Secondary | ICD-10-CM | POA: Insufficient documentation

## 2021-10-11 DIAGNOSIS — I2510 Atherosclerotic heart disease of native coronary artery without angina pectoris: Secondary | ICD-10-CM | POA: Diagnosis not present

## 2021-10-11 DIAGNOSIS — Z7982 Long term (current) use of aspirin: Secondary | ICD-10-CM | POA: Insufficient documentation

## 2021-10-11 DIAGNOSIS — Z87891 Personal history of nicotine dependence: Secondary | ICD-10-CM | POA: Diagnosis not present

## 2021-10-11 DIAGNOSIS — E119 Type 2 diabetes mellitus without complications: Secondary | ICD-10-CM | POA: Diagnosis not present

## 2021-10-11 DIAGNOSIS — Z20822 Contact with and (suspected) exposure to covid-19: Secondary | ICD-10-CM | POA: Insufficient documentation

## 2021-10-11 DIAGNOSIS — R079 Chest pain, unspecified: Secondary | ICD-10-CM | POA: Diagnosis present

## 2021-10-11 DIAGNOSIS — E7800 Pure hypercholesterolemia, unspecified: Secondary | ICD-10-CM | POA: Diagnosis not present

## 2021-10-11 DIAGNOSIS — E785 Hyperlipidemia, unspecified: Secondary | ICD-10-CM | POA: Diagnosis present

## 2021-10-11 DIAGNOSIS — E782 Mixed hyperlipidemia: Secondary | ICD-10-CM | POA: Diagnosis present

## 2021-10-11 DIAGNOSIS — K219 Gastro-esophageal reflux disease without esophagitis: Secondary | ICD-10-CM | POA: Diagnosis not present

## 2021-10-11 DIAGNOSIS — E0800 Diabetes mellitus due to underlying condition with hyperosmolarity without nonketotic hyperglycemic-hyperosmolar coma (NKHHC): Secondary | ICD-10-CM

## 2021-10-11 DIAGNOSIS — I10 Essential (primary) hypertension: Secondary | ICD-10-CM | POA: Diagnosis not present

## 2021-10-11 DIAGNOSIS — I208 Other forms of angina pectoris: Secondary | ICD-10-CM

## 2021-10-11 DIAGNOSIS — F419 Anxiety disorder, unspecified: Secondary | ICD-10-CM | POA: Diagnosis present

## 2021-10-11 DIAGNOSIS — E1159 Type 2 diabetes mellitus with other circulatory complications: Secondary | ICD-10-CM

## 2021-10-11 DIAGNOSIS — R072 Precordial pain: Secondary | ICD-10-CM

## 2021-10-11 LAB — RESP PANEL BY RT-PCR (FLU A&B, COVID) ARPGX2
Influenza A by PCR: NEGATIVE
Influenza B by PCR: NEGATIVE
SARS Coronavirus 2 by RT PCR: NEGATIVE

## 2021-10-11 LAB — CBC
HCT: 45.8 % (ref 39.0–52.0)
Hemoglobin: 16.2 g/dL (ref 13.0–17.0)
MCH: 33.5 pg (ref 26.0–34.0)
MCHC: 35.4 g/dL (ref 30.0–36.0)
MCV: 94.8 fL (ref 80.0–100.0)
Platelets: 344 10*3/uL (ref 150–400)
RBC: 4.83 MIL/uL (ref 4.22–5.81)
RDW: 12.6 % (ref 11.5–15.5)
WBC: 8.8 10*3/uL (ref 4.0–10.5)
nRBC: 0 % (ref 0.0–0.2)

## 2021-10-11 LAB — ECHOCARDIOGRAM COMPLETE
AR max vel: 3.2 cm2
AV Area VTI: 3.16 cm2
AV Area mean vel: 2.8 cm2
AV Mean grad: 3 mmHg
AV Peak grad: 5 mmHg
Ao pk vel: 1.12 m/s
Area-P 1/2: 3.76 cm2
Calc EF: 62.2 %
Height: 69 in
MV VTI: 3.2 cm2
S' Lateral: 3.1 cm
Single Plane A2C EF: 68.9 %
Single Plane A4C EF: 54.2 %
Weight: 3040 oz

## 2021-10-11 LAB — HEMOGLOBIN A1C
Hgb A1c MFr Bld: 9.8 % — ABNORMAL HIGH (ref 4.8–5.6)
Mean Plasma Glucose: 234.56 mg/dL

## 2021-10-11 LAB — BASIC METABOLIC PANEL
Anion gap: 10 (ref 5–15)
BUN: 12 mg/dL (ref 6–20)
CO2: 23 mmol/L (ref 22–32)
Calcium: 9.2 mg/dL (ref 8.9–10.3)
Chloride: 102 mmol/L (ref 98–111)
Creatinine, Ser: 0.81 mg/dL (ref 0.61–1.24)
GFR, Estimated: >60 mL/min (ref >60)
Glucose, Bld: 300 mg/dL — ABNORMAL HIGH (ref 70–99)
Potassium: 4 mmol/L (ref 3.5–5.1)
Sodium: 135 mmol/L (ref 135–145)

## 2021-10-11 LAB — MRSA NEXT GEN BY PCR, NASAL: MRSA by PCR Next Gen: NOT DETECTED

## 2021-10-11 LAB — TROPONIN I (HIGH SENSITIVITY)
Troponin I (High Sensitivity): 3 ng/L (ref <18)
Troponin I (High Sensitivity): 3 ng/L (ref <18)

## 2021-10-11 LAB — GLUCOSE, CAPILLARY
Glucose-Capillary: 80 mg/dL (ref 70–99)
Glucose-Capillary: 207 mg/dL — ABNORMAL HIGH (ref 70–99)

## 2021-10-11 LAB — HEPARIN LEVEL (UNFRACTIONATED): Heparin Unfractionated: <0.1 IU/mL — ABNORMAL LOW (ref 0.30–0.70)

## 2021-10-11 MED ORDER — HEPARIN BOLUS VIA INFUSION
2000.0000 [IU] | Freq: Once | INTRAVENOUS | Status: AC
  Administered 2021-10-12: 2000 [IU] via INTRAVENOUS
  Filled 2021-10-11: qty 2000

## 2021-10-11 MED ORDER — SODIUM CHLORIDE 0.9 % IV BOLUS
1000.0000 mL | Freq: Once | INTRAVENOUS | Status: AC
  Administered 2021-10-11: 11:00:00 1000 mL via INTRAVENOUS

## 2021-10-11 MED ORDER — ASPIRIN EC 81 MG PO TBEC
81.0000 mg | DELAYED_RELEASE_TABLET | Freq: Every day | ORAL | Status: DC
  Administered 2021-10-11: 16:00:00 81 mg via ORAL
  Filled 2021-10-11: qty 1

## 2021-10-11 MED ORDER — HEPARIN (PORCINE) 25000 UT/250ML-% IV SOLN
1400.0000 [IU]/h | INTRAVENOUS | Status: DC
  Administered 2021-10-11: 15:00:00 1000 [IU]/h via INTRAVENOUS
  Filled 2021-10-11 (×3): qty 250

## 2021-10-11 MED ORDER — ASPIRIN EC 81 MG PO TBEC
81.0000 mg | DELAYED_RELEASE_TABLET | Freq: Every day | ORAL | Status: DC
  Administered 2021-10-13: 81 mg via ORAL
  Filled 2021-10-11: qty 1

## 2021-10-11 MED ORDER — INSULIN ASPART 100 UNIT/ML IJ SOLN
0.0000 [IU] | Freq: Three times a day (TID) | INTRAMUSCULAR | Status: DC
Start: 2021-10-11 — End: 2021-10-13
  Administered 2021-10-12: 5 [IU] via SUBCUTANEOUS
  Administered 2021-10-12 – 2021-10-13 (×2): 3 [IU] via SUBCUTANEOUS
  Administered 2021-10-13: 5 [IU] via SUBCUTANEOUS

## 2021-10-11 MED ORDER — SODIUM CHLORIDE 0.9 % IV SOLN: INTRAVENOUS | Status: AC

## 2021-10-11 MED ORDER — SODIUM CHLORIDE 0.9 % WEIGHT BASED INFUSION: 3.0000 mL/kg/h | INTRAVENOUS | Status: DC

## 2021-10-11 MED ORDER — METOPROLOL SUCCINATE ER 25 MG PO TB24
12.5000 mg | ORAL_TABLET | Freq: Every day | ORAL | Status: DC
  Administered 2021-10-11 – 2021-10-12 (×2): 12.5 mg via ORAL
  Filled 2021-10-11 (×2): qty 1

## 2021-10-11 MED ORDER — LORAZEPAM 2 MG/ML IJ SOLN
1.0000 mg | Freq: Once | INTRAMUSCULAR | Status: AC
  Administered 2021-10-11: 10:00:00 1 mg via INTRAVENOUS
  Filled 2021-10-11: qty 1

## 2021-10-11 MED ORDER — MORPHINE SULFATE (PF) 2 MG/ML IV SOLN
2.0000 mg | INTRAVENOUS | Status: DC | PRN
  Administered 2021-10-11: 20:00:00 2 mg via INTRAVENOUS
  Filled 2021-10-11: qty 1

## 2021-10-11 MED ORDER — PANTOPRAZOLE SODIUM 40 MG PO TBEC
40.0000 mg | DELAYED_RELEASE_TABLET | Freq: Every day | ORAL | Status: DC
  Administered 2021-10-11 – 2021-10-13 (×3): 40 mg via ORAL
  Filled 2021-10-11 (×3): qty 1

## 2021-10-11 MED ORDER — ENOXAPARIN SODIUM 40 MG/0.4ML IJ SOSY: 40.0000 mg | PREFILLED_SYRINGE | INTRAMUSCULAR | Status: DC

## 2021-10-11 MED ORDER — HEPARIN BOLUS VIA INFUSION
4000.0000 [IU] | Freq: Once | INTRAVENOUS | Status: AC
  Administered 2021-10-11: 16:00:00 4000 [IU] via INTRAVENOUS
  Filled 2021-10-11: qty 4000

## 2021-10-11 MED ORDER — ACETAMINOPHEN 325 MG PO TABS
650.0000 mg | ORAL_TABLET | Freq: Four times a day (QID) | ORAL | Status: DC | PRN
  Administered 2021-10-11 – 2021-10-12 (×2): 650 mg via ORAL
  Filled 2021-10-11 (×2): qty 2

## 2021-10-11 MED ORDER — ASPIRIN 81 MG PO CHEW
81.0000 mg | CHEWABLE_TABLET | ORAL | Status: AC
  Administered 2021-10-12: 81 mg via ORAL
  Filled 2021-10-11: qty 1

## 2021-10-11 MED ORDER — DIPHENHYDRAMINE HCL 25 MG PO CAPS
25.0000 mg | ORAL_CAPSULE | Freq: Every evening | ORAL | Status: DC | PRN
  Administered 2021-10-11: 23:00:00 25 mg via ORAL
  Filled 2021-10-11: qty 1

## 2021-10-11 MED ORDER — DIPHENHYDRAMINE HCL 50 MG/ML IJ SOLN
25.0000 mg | Freq: Once | INTRAMUSCULAR | Status: AC
  Administered 2021-10-11: 11:00:00 25 mg via INTRAVENOUS
  Filled 2021-10-11: qty 1

## 2021-10-11 MED ORDER — INSULIN ASPART 100 UNIT/ML IJ SOLN
0.0000 [IU] | Freq: Every day | INTRAMUSCULAR | Status: DC
  Administered 2021-10-11: 22:00:00 2 [IU] via SUBCUTANEOUS
  Administered 2021-10-12: 5 [IU] via SUBCUTANEOUS

## 2021-10-11 MED ORDER — MORPHINE SULFATE (PF) 2 MG/ML IV SOLN
2.0000 mg | Freq: Once | INTRAVENOUS | Status: AC
  Administered 2021-10-11: 10:00:00 2 mg via INTRAVENOUS
  Filled 2021-10-11: qty 1

## 2021-10-11 MED ORDER — NITROGLYCERIN 0.4 MG SL SUBL: 0.4000 mg | SUBLINGUAL_TABLET | SUBLINGUAL | Status: DC | PRN

## 2021-10-11 MED ORDER — SODIUM CHLORIDE 0.9% FLUSH
3.0000 mL | Freq: Two times a day (BID) | INTRAVENOUS | Status: DC
  Administered 2021-10-11 – 2021-10-13 (×4): 3 mL via INTRAVENOUS

## 2021-10-11 MED ORDER — SODIUM CHLORIDE 0.9 % IV SOLN: 250.0000 mL | INTRAVENOUS | Status: DC | PRN

## 2021-10-11 MED ORDER — IOHEXOL 350 MG/ML SOLN
75.0000 mL | Freq: Once | INTRAVENOUS | Status: AC | PRN
  Administered 2021-10-11: 11:00:00 75 mL via INTRAVENOUS

## 2021-10-11 MED ORDER — ONDANSETRON HCL 4 MG/2ML IJ SOLN
4.0000 mg | Freq: Once | INTRAMUSCULAR | Status: AC
  Administered 2021-10-11: 10:00:00 4 mg via INTRAVENOUS
  Filled 2021-10-11: qty 2

## 2021-10-11 MED ORDER — SODIUM CHLORIDE 0.9 % WEIGHT BASED INFUSION
1.0000 mL/kg/h | INTRAVENOUS | Status: DC
  Administered 2021-10-12: 250 mL via INTRAVENOUS

## 2021-10-11 MED ORDER — FAMOTIDINE IN NACL 20-0.9 MG/50ML-% IV SOLN
20.0000 mg | Freq: Once | INTRAVENOUS | Status: AC
  Administered 2021-10-11: 11:00:00 20 mg via INTRAVENOUS
  Filled 2021-10-11: qty 50

## 2021-10-11 MED ORDER — SODIUM CHLORIDE 0.9% FLUSH: 3.0000 mL | INTRAVENOUS | Status: DC | PRN

## 2021-10-11 MED ORDER — INSULIN DETEMIR 100 UNIT/ML ~~LOC~~ SOLN
45.0000 [IU] | Freq: Every day | SUBCUTANEOUS | Status: DC
  Filled 2021-10-11 (×3): qty 0.45

## 2021-10-11 MED ORDER — NITROGLYCERIN 0.4 MG/HR TD PT24
0.4000 mg | MEDICATED_PATCH | Freq: Every day | TRANSDERMAL | Status: DC
  Administered 2021-10-11 – 2021-10-12 (×2): 0.4 mg via TRANSDERMAL
  Filled 2021-10-11 (×4): qty 1

## 2021-10-11 NOTE — Progress Notes (Signed)
Order received to transfer patient to [facility] for higher level of care, patient made aware of transfer. Wife at bedside. Report given to [facility] on 2C, patient made aware of bed approval. [facility] to be called for transport.

## 2021-10-11 NOTE — H&P (Addendum)
History and Physical        Hospital Admission Note Date: 10/11/2021  Patient name: Shawn Hughes Medical record number: [id number] Date of birth: March 20, 1966 Age: 55 y.o. Gender: male  PCP: [facility]    Patient coming from: home   I have reviewed all records in the [facility].    Chief Complaint:  Chest pain   HPI: Patient is a 55 year old male with history of CAD, DES in 2017, hypertension, hyperlipidemia, type 2 diabetes mellitus, IDDM, GERD presented to ED with chest pain.  Patient reported that he had intermittent chest pain episodes since Sunday in the last 5 days and would spontaneously resolve.  However since yesterday his chest pain became more intense, 10/10, sharp, midsternal and radiating to collarbones, worse with exertion, associated with nausea and shortness of breath.  No dizziness, lightheadedness or syncopal episode.  Chest pain was alleviated with sublingual nitro.  At the time of my encounter, chest pain has improved  Prior cardiac work-up Patient had NSTEMI in 2017 and had DES x1 to ramus branch Stress test in 2020, low risk study, EF 58% 2D echo 08/2019: EF 60 to [id number], normal systolic function  ED work-up/course:  In ED, temp 98.1, pulse 114, BP 165/103 Received Ativan 1 mg, morphine 2 mg IV x1 Troponins flat 3, 3  CTA chest negative for any PE CBC, BMET unremarkable except glucose 300 EKG rate 118, sinus tachycardia, repolarization changes  Cardiology was consulted, recommended observation  Review of Systems: Positives marked in 'bold' Constitutional: Denies fever, chills, diaphoresis, poor appetite and fatigue.  HEENT: Denies photophobia, eye pain, redness, hearing loss, ear pain, congestion, sore throat, rhinorrhea, sneezing, mouth sores, trouble swallowing, neck pain, neck stiffness and tinnitus.   Respiratory: No  acute shortness of breath, wheezing or coughing Cardiovascular: Please see HPI Gastrointestinal: + nausea, No vomiting, abdominal pain, diarrhea, constipation, blood in stool and abdominal distention.  Genitourinary: Denies dysuria, urgency, frequency, hematuria, flank pain and difficulty urinating.  Musculoskeletal: Denies myalgias, back pain, joint swelling, arthralgias and gait problem.  Skin: Denies pallor, rash and wound.  Neurological: Denies dizziness, seizures, syncope, weakness, light-headedness, numbness and headaches.  Hematological: Denies easy bruising, personal or family bleeding history  Psychiatric/Behavioral: Denies suicidal ideation, mood changes, confusion, nervousness, sleep disturbance and agitation  Past Medical History: Past Medical History:  Diagnosis Date   CAD (coronary artery disease)    a. 08/2016: cath showing 40% prox RCA, 10% LCx, 20% LAD and 99% RI stenosis. DES to RI.   COVID-14 October 2019 - was not hospitalized   Essential hypertension    GERD (gastroesophageal reflux disease)    History of nephrolithiasis    Mixed hyperlipidemia    Type 2 diabetes mellitus ([place])     Past Surgical History: Past Surgical History:  Procedure Laterality Date   CARDIAC CATHETERIZATION N/A 08/30/2016   Procedure: Left Heart Cath and Coronary Angiography;  Surgeon: Burnell Blanks, MD;  Location: [facility];  Service: Cardiovascular;  Laterality: N/A;   CARDIAC CATHETERIZATION N/A 08/30/2016   Procedure: Coronary Stent Intervention;  Surgeon: Burnell Blanks, MD;  Location: [facility];  Service: Cardiovascular;  Laterality: N/A;   ESOPHAGOGASTRODUODENOSCOPY  01/10/2012   HEN:IDPOEUMPN in the distal esophagus2O TO GERD/Mild gastritis   LACERATION REPAIR     Right arm    Medications: Prior to Admission medications   Medication Sig Start Date End Date Taking? Authorizing Provider  aspirin EC 81 MG tablet Take 1 tablet (81 mg total) by  mouth daily with breakfast. 09/21/19  Yes Emokpae, Courage, MD  insulin detemir (LEVEMIR) 100 UNIT/ML injection Inject 0.12 mLs (12 Units total) into the skin at bedtime. Please Give 90-day supply Patient taking differently: Inject 40-80 Units into the skin at bedtime. Please Give 90-day supply 09/21/19 10/11/21 Yes Shon Hale, MD  metFORMIN (GLUMETZA) 500 MG (MOD) 24 hr tablet Take 2 tablets (1,000 mg total) by mouth 2 (two) times daily with a meal. 09/21/19  Yes Emokpae, Courage, MD  metoprolol succinate (TOPROL XL) 25 MG 24 hr tablet Take 0.5 tablets (12.5 mg total) by mouth daily. 07/26/20  Yes Jonelle Sidle, MD  nitroGLYCERIN (NITROSTAT) 0.4 MG SL tablet Place 1 tablet (0.4 mg total) under the tongue every 5 (five) minutes as needed for chest pain. 07/26/20  Yes Jonelle Sidle, MD  ezetimibe (ZETIA) 10 MG tablet Take 1 tablet (10 mg total) by mouth daily. Patient not taking: Reported on 10/11/2021 09/22/19   Shon Hale, MD  losartan (COZAAR) 25 MG tablet Take 1 tablet (25 mg total) by mouth daily. For anxiety Patient not taking: Reported on 10/11/2021 01/12/20   Ellsworth Lennox, PA-C    Allergies:   Allergies  Allergen Reactions   Insulin Glargine Anaphylaxis    Anaphylaxis    Lortab [Hydrocodone-Acetaminophen] Itching   Lipitor [Atorvastatin Calcium] Other (See Comments)    Extreme muscle weakness, passing out, generalized pain all through body   Hydrocodone Itching   Iodinated Diagnostic Agents Itching    Pt given contrast and began itching after injection.     Social History:  reports that he quit smoking about 5 years ago. His smoking use included cigarettes. He started smoking about 38 years ago. He has never used smokeless tobacco. He reports that he drinks alcohol occasionally with supper . He reports that he does not use drugs.  Family History: Family History  Problem Relation Age of Onset   CAD Mother 47   CAD Father 46   Hypertension Brother     Colon cancer Neg Hx    Colon polyps Neg Hx    Esophageal cancer Neg Hx    Pancreatic cancer Neg Hx    Stomach cancer Neg Hx     Physical Exam: Blood pressure (!) 142/88, pulse (!) 110, temperature 98.1 F (36.7 C), temperature source Oral, resp. rate 18, height 5\' 9"  (1.753 m), weight 86.2 kg, SpO2 98 %. General: Alert, awake, oriented x3, in no acute distress. Eyes: pink conjunctiva,anicteric sclera, pupils equal and reactive to light and accomodation, HEENT: normocephalic, atraumatic, oropharynx clear Neck: supple, no masses or lymphadenopathy, no goiter, no bruits, no JVD CVS: Regular rate and rhythm, without murmurs, rubs or gallops. No lower extremity edema Resp : Clear to auscultation bilaterally, no wheezing, rales or rhonchi. GI : Soft, nontender, nondistended, positive bowel sounds. No hepatomegaly. No hernia.  Musculoskeletal: No clubbing or cyanosis, positive pedal pulses. No contracture. ROM intact  Neuro: Grossly intact, no focal neurological deficits, strength 5/5 upper and lower extremities bilaterally Psych: alert and oriented x 3, normal mood and affect Skin: no rashes or lesions, warm and dry   LABS  on Admission: I have personally reviewed all the labs and imagings below    Basic Metabolic Panel: Recent Labs  Lab 10/11/21 0913  NA 135  K 4.0  CL 102  CO2 23  GLUCOSE 300*  BUN 12  CREATININE 0.81  CALCIUM 9.2   Liver Function Tests: No results for input(s): AST, ALT, ALKPHOS, BILITOT, PROT, ALBUMIN in the last 168 hours. No results for input(s): LIPASE, AMYLASE in the last 168 hours. No results for input(s): AMMONIA in the last 168 hours. CBC: Recent Labs  Lab 10/11/21 0913  WBC 8.8  HGB 16.2  HCT 45.8  MCV 94.8  PLT 344   Cardiac Enzymes: No results for input(s): CKTOTAL, CKMB, CKMBINDEX, TROPONINI in the last 168 hours. BNP: Invalid input(s): POCBNP CBG: No results for input(s): GLUCAP in the last 168 hours.  Radiological Exams on  Admission:  DG Chest 2 View  Result Date: 10/11/2021 CLINICAL DATA:  Chest pain short of breath EXAM: CHEST - 2 VIEW COMPARISON:  09/20/2019 FINDINGS: The heart size and mediastinal contours are within normal limits. Left coronary stent noted. Both lungs are clear. The visualized skeletal structures are unremarkable. IMPRESSION: No active cardiopulmonary disease. Electronically Signed   By: Franchot Gallo M.D.   On: 10/11/2021 09:39   CT Angio Chest PE W/Cm &/Or Wo Cm  Result Date: 10/11/2021 CLINICAL DATA:  Chest pain EXAM: CT ANGIOGRAPHY CHEST WITH CONTRAST TECHNIQUE: Multidetector CT imaging of the chest was performed using the standard protocol during bolus administration of intravenous contrast. Multiplanar CT image reconstructions and MIPs were obtained to evaluate the vascular anatomy. CONTRAST:  51mL OMNIPAQUE IOHEXOL 350 MG/ML SOLN COMPARISON:  Chest radiograph performed on the same date FINDINGS: Cardiovascular: Satisfactory opacification of the pulmonary arteries to the segmental level. No evidence of pulmonary embolism. Normal heart size. Vascular stent in the circumflex artery no pericardial effusion. Mediastinum/Nodes: No enlarged mediastinal, hilar, or axillary lymph nodes. Thyroid gland, trachea, and esophagus demonstrate no significant findings. Lungs/Pleura: Lungs are clear. No pleural effusion or pneumothorax. Upper Abdomen: No acute abnormality. Musculoskeletal: No chest wall abnormality. No acute or significant osseous findings. Review of the MIP images confirms the above findings. IMPRESSION: 1.  No evidence of pulmonary embolism. 2.  No acute cardiopulmonary process. Electronically Signed   By: Keane Police D.O.   On: 10/11/2021 11:16      EKG: Independently reviewed.  Rate 118, sinus tachycardia, repolarization changes   Assessment/Plan Principal Problem: Atypical chest pain, angina equivalent in the setting of prior history of DES/CAD, high risk factors of hypertension,  hyperlipidemia, uncontrolled diabetes mellitus  -Troponins x2 negative, EKG with no acute ST depression/elevation -Chest pain is now improving, EDP consulted with cardiology, recommended overnight observation -Will follow recommendations if patient needs further cardiac work-up including stress test versus cardiac cath -Does have history of GERD, will place on Protonix.  Patient stated the chest pain does not feel like acid reflux. Addendum: 4:45PM Called by cardiology, plan for cardiac cath in am and recommended transfer to [facility].   Active Problems:   GERD (gastroesophageal reflux disease) -Placed on Protonix 40 mg daily    Diabetes mellitus ([place]), type II, IDDM, uncontrolled with hyperglycemia -Patient has been taking metformin 1000 mg in the morning, typically does not eat breakfast, then takes Levemir 40 units after lunch and then takes another 40 units at supper if blood sugars are running high.  Per patient, his blood sugars has been running high during the day and has low CBGs  around 3-3:30 am.  -Discussed about insulin regimen, will obtain hemoglobin A1c, placed on sliding scale insulin (moderate), Levemir 45 units daily and will readjust insulin regimen based on the readings in 24 hours -Placed diabetic coordinator consult, will need ambulatory referral to endocrinology at the time of discharge    Hypertension -Currently not on any antihypertensives, however BP elevated on admission -Should be on ACEI or ARB, will place on hydralazine IV as needed for now with parameters     Anxiety disorder -Patient received Ativan IV x1 in ED, currently no acute issues    HLD (hyperlipidemia) -He has been previously intolerant to Lipitor, Crestor, pravastatin, fatigue with Zetia.  Likely will need PCSK9 inhibitor therapy, will defer to cardiology    DVT prophylaxis: Lovenox  CODE STATUS: DNR/DNI, discussed in detail with the patient   Consults called: Cardiology  Family  Communication: Admission, patients condition and plan of care including tests being ordered have been discussed with the patient and wife at the bedside  who indicates understanding and agree with the plan and Code Status  Admission status:   The medical decision making on this patient was of high complexity and the patient is at high risk for clinical deterioration, therefore this is a level 3 admission.  Severity of Illness:      The appropriate patient status for this patient is OBSERVATION. Observation status is judged to be reasonable and necessary in order to provide the required intensity of service to ensure the patient's safety. The patient's presenting symptoms, physical exam findings, and initial radiographic and laboratory data in the context of their medical condition is felt to place them at decreased risk for further clinical deterioration. Furthermore, it is anticipated that the patient will be medically stable for discharge from the hospital within 2 midnights of admission.    Time Spent on Admission: 34mins      Macil Crady M.D. [facility] 10/11/2021, 1:35 PM

## 2021-10-11 NOTE — Progress Notes (Signed)
*  PRELIMINARY RESULTS* Echocardiogram 2D Echocardiogram has been performed.  Shawn Hughes 10/11/2021, 4:28 PM

## 2021-10-11 NOTE — ED Notes (Signed)
Pt transported to CT ?

## 2021-10-11 NOTE — Progress Notes (Addendum)
Patient arrived to floor from ED. Alert and oriented, Donnita Falls, RN and Craige Cotta, RN at bedside with this nurse. VS obtained, patient c/o pain 5/10 to chest, but states the pain is better than when he initially arrived to ED. Call bell in reach and needs assessed.

## 2021-10-11 NOTE — Consult Note (Addendum)
Cardiology Consultation:   Patient ID: Shawn Hughes MRN: [id number]; DOB: [birth year]  Admit date: 10/11/2021 Date of Consult: 10/11/2021  PCP:  [facility]   [facility] Providers Cardiologist:  Nona Dell, MD        Patient Profile:   Shawn Hughes is a 55 y.o. male with a past medical history of CAD (s/p DES to RI in 2017 with medical management recommended of residual disease, low-risk NST in 08/2019), HTN, HLD, Type 2 DM and GERD who is being seen 10/11/2021 for the evaluation of chest pain at the request of Dr. Deretha Emory.  History of Present Illness:   Mr. Brockel was last examined by Dr. Diona Browner in 07/2020 and denied any recent anginal symptoms at that time report fatigue with Zetia and previously been intolerant to Lipitor, Crestor and Pravastatin.  It was recommended that he be referred to the [facility]id Clinic for consideration of PCSK9 inhibitor therapy.  It appears a repeat FLP was recommended but this was never obtained.  He presented to [facility] ED this morning for evaluation of chest pain.  Reports chest pains started on Sunday. Tightness mid to left chest, initialyl 4/10 in severity associated with SOB, lightheadedness, feeling clammy. Worst with laying down, better with laying forward. Over Mon,Tues, Wed incresing in frequency and severity of pain. At its worst 8/20 in severity. Varaible in duration, initially lasted minutes at a time, episode leading to coming to ER lasted over 24 hours. Reports rapid improvement with taking SL NG at home.    Initial labs showed WBC 8.8, Hgb 16.2, platelets 344, Na+ 135, K+ 4.0 and creatinine 0.81. Glucose elevated to 300. Initial and repeat Hs Troponin negative at 3. COVID pending. EKG shows sinus tachycardia, HR 110 with borderline LVH and flattened T-waves along the lateral leads which is similar to prior tracings. CXR with no active cardiopulmonary disease. CTA showing no evidence of a PE.   Past Medical  History:  Diagnosis Date   CAD (coronary artery disease)    a. 08/2016: cath showing 40% prox RCA, 10% LCx, 20% LAD and 99% RI stenosis. DES to RI.   COVID-14 October 2019 - was not hospitalized   Essential hypertension    GERD (gastroesophageal reflux disease)    History of nephrolithiasis    Mixed hyperlipidemia    Type 2 diabetes mellitus (HCC)     Past Surgical History:  Procedure Laterality Date   CARDIAC CATHETERIZATION N/A 08/30/2016   Procedure: Left Heart Cath and Coronary Angiography;  Surgeon: Kathleene Hazel, MD;  Location: [facility] INVASIVE CV LAB;  Service: Cardiovascular;  Laterality: N/A;   CARDIAC CATHETERIZATION N/A 08/30/2016   Procedure: Coronary Stent Intervention;  Surgeon: Kathleene Hazel, MD;  Location: [facility] INVASIVE CV LAB;  Service: Cardiovascular;  Laterality: N/A;   ESOPHAGOGASTRODUODENOSCOPY  01/10/2012   XNA:[id number] in the distal esophagus2O TO GERD/Mild gastritis   LACERATION REPAIR     Right arm     Home Medications:  Prior to Admission medications   Medication Sig Start Date End Date Taking? Authorizing Provider  aspirin EC 81 MG tablet Take 1 tablet (81 mg total) by mouth daily with breakfast. 09/21/19  Yes Emokpae, Courage, MD  insulin detemir (LEVEMIR) 100 UNIT/ML injection Inject 0.12 mLs (12 Units total) into the skin at bedtime. Please Give 90-day supply Patient taking differently: Inject 40-80 Units into the skin at bedtime. Please Give 90-day supply 09/21/19 10/11/21 Yes Shon Hale, MD  metFORMIN (GLUMETZA) 500 MG (  MOD) 24 hr tablet Take 2 tablets (1,000 mg total) by mouth 2 (two) times daily with a meal. 09/21/19  Yes Emokpae, Courage, MD  metoprolol succinate (TOPROL XL) 25 MG 24 hr tablet Take 0.5 tablets (12.5 mg total) by mouth daily. 07/26/20  Yes Jonelle Sidle, MD  nitroGLYCERIN (NITROSTAT) 0.4 MG SL tablet Place 1 tablet (0.4 mg total) under the tongue every 5 (five) minutes as needed for chest pain. 07/26/20  Yes  Jonelle Sidle, MD  ezetimibe (ZETIA) 10 MG tablet Take 1 tablet (10 mg total) by mouth daily. Patient not taking: Reported on 10/11/2021 09/22/19   Shon Hale, MD  losartan (COZAAR) 25 MG tablet Take 1 tablet (25 mg total) by mouth daily. For anxiety Patient not taking: Reported on 10/11/2021 01/12/20   Ellsworth Lennox, PA-C    Inpatient Medications: Scheduled Meds:  Continuous Infusions:  sodium chloride 100 mL/hr at 10/11/21 1157   PRN Meds:   Allergies:    Allergies  Allergen Reactions   Insulin Glargine Anaphylaxis    Anaphylaxis    Lortab [Hydrocodone-Acetaminophen] Itching   Lipitor [Atorvastatin Calcium] Other (See Comments)    Extreme muscle weakness, passing out, generalized pain all through body   Hydrocodone Itching   Iodinated Diagnostic Agents Itching    Pt given contrast and began itching after injection.     Social History:   Social History   Socioeconomic History   Marital status: Married    Spouse name: Not on file   Number of children: Not on file   Years of education: Not on file   Highest education level: Not on file  Occupational History   Not on file  Tobacco Use   Smoking status: Former    Packs/day: 0.00    Years: 15.00    Pack years: 0.00    Types: Cigarettes    Start date: 09/11/1983    Quit date: 08/30/2016    Years since quitting: 5.1   Smokeless tobacco: Never  Vaping Use   Vaping Use: Never used  Substance and Sexual Activity   Alcohol use: Not Currently    Comment: occasional   Drug use: No   Sexual activity: Yes  Other Topics Concern   Not on file  Social History Narrative   Not on file   Social Determinants of Health   Financial Resource Strain: Not on file  Food Insecurity: Not on file  Transportation Needs: Not on file  Physical Activity: Not on file  Stress: Not on file  Social Connections: Not on file  Intimate Partner Violence: Not on file    Family History:    Family History  Problem  Relation Age of Onset   CAD Mother 21   CAD Father 95   Hypertension Brother    Colon cancer Neg Hx    Colon polyps Neg Hx    Esophageal cancer Neg Hx    Pancreatic cancer Neg Hx    Stomach cancer Neg Hx      ROS:  Please see the history of present illness.   All other ROS reviewed and negative.     Physical Exam/Data:   Vitals:   10/11/21 1133 10/11/21 1139 10/11/21 1200 10/11/21 1230  BP: (!) 150/100  (!) 147/103 (!) 142/88  Pulse: 98 94 100 (!) 110  Resp: (!) 21 16 14 18   Temp:      TempSrc:      SpO2: 99% 98% 98% 98%  Weight:  Height:       No intake or output data in the 24 hours ending 10/11/21 1250 Last 3 Weights 10/11/2021 07/26/2020 01/13/2020  Weight (lbs) 190 lb 194 lb 193 lb  Weight (kg) 86.183 kg 87.998 kg 87.544 kg     Body mass index is 28.06 kg/m.  General:  Well nourished, well developed, in no acute distress HEENT: normal Neck: no JVD Vascular: No carotid bruits; Distal pulses 2+ bilaterally Cardiac:  normal S1, S2; RRR; no murmur  Lungs:  clear to auscultation bilaterally, no wheezing, rhonchi or rales  Abd: soft, nontender, no hepatomegaly  Ext: no edema Musculoskeletal:  No deformities, BUE and BLE strength normal and equal Skin: warm and dry  Neuro:  CNs 2-12 intact, no focal abnormalities noted Psych:  Normal affect    Relevant CV Studies:  LHC: 08/2016 Prox RCA lesion, 40 %stenosed. Mid Cx lesion, 10 %stenosed. Prox LAD to Mid LAD lesion, 20 %stenosed. A STENT PROMUS PREM MR 2.5X16 drug eluting stent was successfully placed. Ramus lesion, 99 %stenosed. Post intervention, there is a 0% residual stenosis. There is mild left ventricular systolic dysfunction. LV end diastolic pressure is mildly elevated. The left ventricular ejection fraction is 50-55% by visual estimate. There is no mitral valve regurgitation.   1. NSTEMI secondary to severe, ulcerated stenosis Ramus Intermediate Khyre Germond 2. Successful PTCA/DES x 1 Ramus  Intermediate Enna Warwick 3. Mild non-obstructive disease in the LAD and RCA 4. The overall LV systolic function is preserved but there is hypokinesis of the anterolateral wall.    Recommendations: Will need DAPT with ASA and Brilinta x 1 year. Continue beta blocker and statin. Echo tomorrow then likely d/c home tomorrow.   NST: 08/2019 There was no ST segment deviation noted during stress. TWI's seen in inferolateral leads. Defect 1: There is a medium defect of mild severity present in the mid inferior, apical inferior and apical lateral location. Likely due to soft tissue attenuation vs small region of basal inferior ischemia. Overall findings support a low risk study. Nuclear stress EF: 58%.   Echocardiogram: 08/2019 IMPRESSIONS     1. Left ventricular ejection fraction, by visual estimation, is 60 to  65%. The left ventricle has normal function. There is no left ventricular  hypertrophy.   2. Global right ventricle has normal systolic function.The right  ventricular size is normal. No increase in right ventricular wall  thickness.   3. Left atrial size was normal.   4. Right atrial size was normal.   5. The mitral valve is grossly normal. No evidence of mitral valve  regurgitation.   6. The tricuspid valve is grossly normal. Tricuspid valve regurgitation  is mild.   7. The aortic valve is tricuspid Aortic valve regurgitation was not  visualized by color flow Doppler. Structurally normal aortic valve, with  no evidence of sclerosis or stenosis.   8. The pulmonic valve was grossly normal. Pulmonic valve regurgitation is  not visualized by color flow Doppler.   9. Normal pulmonary artery systolic pressure.  10. The inferior vena cava is normal in size with greater than 50%  respiratory variability, suggesting right atrial pressure of 3 mmHg.   Laboratory Data:  High Sensitivity Troponin:   Recent Labs  Lab 10/11/21 0913 10/11/21 1104  TROPONINIHS 3 3     Chemistry Recent  Labs  Lab 10/11/21 0913  NA 135  K 4.0  CL 102  CO2 23  GLUCOSE 300*  BUN 12  CREATININE 0.81  CALCIUM 9.2  GFRNONAA >60  ANIONGAP 10    No results for input(s): PROT, ALBUMIN, AST, ALT, ALKPHOS, BILITOT in the last 168 hours. Lipids No results for input(s): CHOL, TRIG, HDL, LABVLDL, LDLCALC, CHOLHDL in the last 168 hours.  Hematology Recent Labs  Lab 10/11/21 0913  WBC 8.8  RBC 4.83  HGB 16.2  HCT 45.8  MCV 94.8  MCH 33.5  MCHC 35.4  RDW 12.6  PLT 344   Thyroid No results for input(s): TSH, FREET4 in the last 168 hours.  BNPNo results for input(s): BNP, PROBNP in the last 168 hours.  DDimer No results for input(s): DDIMER in the last 168 hours.   Radiology/Studies:  DG Chest 2 View  Result Date: 10/11/2021 CLINICAL DATA:  Chest pain short of breath EXAM: CHEST - 2 VIEW COMPARISON:  09/20/2019 FINDINGS: The heart size and mediastinal contours are within normal limits. Left coronary stent noted. Both lungs are clear. The visualized skeletal structures are unremarkable. IMPRESSION: No active cardiopulmonary disease. Electronically Signed   By: Marlan Palau M.D.   On: 10/11/2021 09:39   CT Angio Chest PE W/Cm &/Or Wo Cm  Result Date: 10/11/2021 CLINICAL DATA:  Chest pain EXAM: CT ANGIOGRAPHY CHEST WITH CONTRAST TECHNIQUE: Multidetector CT imaging of the chest was performed using the standard protocol during bolus administration of intravenous contrast. Multiplanar CT image reconstructions and MIPs were obtained to evaluate the vascular anatomy. CONTRAST:  75mL OMNIPAQUE IOHEXOL 350 MG/ML SOLN COMPARISON:  Chest radiograph performed on the same date FINDINGS: Cardiovascular: Satisfactory opacification of the pulmonary arteries to the segmental level. No evidence of pulmonary embolism. Normal heart size. Vascular stent in the circumflex artery no pericardial effusion. Mediastinum/Nodes: No enlarged mediastinal, hilar, or axillary lymph nodes. Thyroid gland, trachea, and  esophagus demonstrate no significant findings. Lungs/Pleura: Lungs are clear. No pleural effusion or pneumothorax. Upper Abdomen: No acute abnormality. Musculoskeletal: No chest wall abnormality. No acute or significant osseous findings. Review of the MIP images confirms the above findings. IMPRESSION: 1.  No evidence of pulmonary embolism. 2.  No acute cardiopulmonary process. Electronically Signed   By: Larose Hires D.O.   On: 10/11/2021 11:16     Assessment and Plan:   1.History CAD/ Chest pain - history of prior DES to ramus in 2017 - presents with very mixed chest symptoms. Atypical in sense of duration, one episode lasting over 24 hours and also some positional component. Typical in the sense rapidly resolves with SL NG. Symptoms have been escalating over the last few days raising concern for progressing unstable angina.  - no evidence of ACS thus far by EKG or enzymes. CT is pending.  - echo is pending.   - f/u echo results. Will need an ischemic evaluation. He reports significant adverse reaction to lexiscan during his 2020 study. From review study reports significant flushing,nausea,and became hypotensive. With active chest pain would avoid exercise or dobutamine, with stent not a candidate for coronary CTA. Very likely will need a cardiac catheterization for best and most definitive evaluation, will make final decision once echo back.   -medical therapy with toprol 12.5mg , losartan 25, ASA 81, zetia  (statin intolerant). Place NG patch, can progress to NG drip if needed. With ongoing chest pain and clinical concern for unstable angina will start hep gtt.   Risk Assessment/Risk Scores:     HEAR Score (for undifferentiated chest pain):  HEAR Score: 5  For questions or updates, please contact [facility] Please consult [email or web address] for contact info under  Attending note Patient seen and discussed with PA Iran Ouch, I agree with her documentation. 56 yo male history of CAD with  DES to ramus in 08/2016, DM2, HL, HTN, presents with chest pains. Mixed symptoms with some typical and atypical features as reported above.    K 4 Cr 0.81 BUN 12 WBC 8.8 Plt 344  Trop 3-->3 EKG sinus tach, chronic nonspecific ST/T changes CXR no acute process CT PE no PE  08/2019 nuclear stress: low risk. Basal inferior artifact vs mild ischemia 08/2019 echo: LVEF 60-65%, no WMAs, normal RV    History of CAD presents with chest pain. Very mixed symptoms some typical and some atypical. Atypical in sense of duration, positional component. Typical in sense rapidly improves with SL NG, similar location and qualities to pain prior to last stent. Significant reaction to lexiscan during 2020 study, would avoid dobutamine and exercise with active pain, not a CTA candidate due to prior stent. I think given clinical concern for possible unstable angina and ischemic testing limitations likely will need cath. F/u echo today and make final decision.  445pm Addendum Echo shows normal LVEF, no WMAs. Given his history, progressive symptoms, limitations on stress testing as reported above best option would be cath. Discussed with patient and family and they are in agreeement. Medicine team to arrange transfer, we will arrange cath for tomorrow  I have reviewed the risks, indications, and alternatives to cardiac catheterization, possible angioplasty, and stenting with the patient and his wife today. Risks include but are not limited to bleeding, infection, vascular injury, stroke, myocardial infection, arrhythmia, kidney injury, radiation-related injury in the case of prolonged fluoroscopy use, emergency cardiac surgery, and death. The patient understands the risks of serious complication is 1-2 in 1000 with diagnostic cardiac cath and 1-2% or less with angioplasty/stenting.    Dina Rich MD

## 2021-10-11 NOTE — Progress Notes (Signed)
Inpatient Diabetes Program Recommendations  AACE/ADA: New Consensus Statement on Inpatient Glycemic Control (2015)  Target Ranges:  Prepandial:   less than 140 mg/dL      Peak postprandial:   less than 180 mg/dL (1-2 hours)      Critically ill patients:  140 - 180 mg/dL   Lab Results  Component Value Date   GLUCAP 261 (H) 09/21/2019   HGBA1C 13.9 (H) 12/14/2016    Review of Glycemic Control  Diabetes history: DM2 Outpatient Diabetes medications: Metformin 1000 BID, Levemir 40-80 units  Current orders for Inpatient glycemic control: Levemir 45 units QD, Novolog 0-15 units TID and 0-5 units QHS  Inpatient Diabetes Program Recommendations:     Inpatient diabetes consult received for uncontrolled diabetes.  Patient is in the with CP.  Agree with current regimen in ED.  MD is ordering A1C.  Will follow trends.    Will continue to follow while inpatient.  Thank you, Dulce Sellar, RN, BSN Diabetes Coordinator Inpatient Diabetes Program [phone] (team pager from 8a-5p)

## 2021-10-11 NOTE — ED Provider Notes (Signed)
[facility] EMERGENCY DEPARTMENT Provider Note   CSN: [id number] Arrival date & time: 10/11/21  0847     History Chief Complaint  Patient presents with   Chest Pain    Shawn Hughes is a 55 y.o. male.  Patient with known history of coronary artery disease.  Had stent placed in 2017.  Followed by cardiology here.  Patient with a complaint of substernal chest pain since Monday.  Associated with shortness of breath also with complaint of bilateral calf pain.  Patient would take nitroglycerin and pain would go away briefly.  Currently patient has nonstop pain since yesterday.      Past Medical History:  Diagnosis Date   CAD (coronary artery disease)    a. 08/2016: cath showing 40% prox RCA, 10% LCx, 20% LAD and 99% RI stenosis. DES to RI.   COVID-14 October 2019 - was not hospitalized   Essential hypertension    GERD (gastroesophageal reflux disease)    History of nephrolithiasis    Mixed hyperlipidemia    Type 2 diabetes mellitus (HCC)     Patient Active Problem List   Diagnosis Date Noted   HLD (hyperlipidemia) 09/21/2019   Chest pain 09/20/2019   Coronary artery disease    History of coronary artery stent placement    Anxiety disorder    Diabetes mellitus (HCC) 05/11/2019   Hypertension 05/11/2019   Tobacco use 09/01/2016   ACS (acute coronary syndrome) (HCC) 08/30/2016   NSTEMI (non-ST elevated myocardial infarction) (HCC)    Loose stools 09/02/2013   GERD (gastroesophageal reflux disease) 09/02/2013   Chest pain 01/09/2012   WEIGHT LOSS 08/03/2010   NAUSEA WITH VOMITING 08/03/2010   CHANGE IN BOWELS 08/03/2010   EPIGASTRIC PAIN 08/03/2010    Past Surgical History:  Procedure Laterality Date   CARDIAC CATHETERIZATION N/A 08/30/2016   Procedure: Left Heart Cath and Coronary Angiography;  Surgeon: Kathleene Hazel, MD;  Location: [facility] INVASIVE CV LAB;  Service: Cardiovascular;  Laterality: N/A;   CARDIAC CATHETERIZATION N/A 08/30/2016   Procedure:  Coronary Stent Intervention;  Surgeon: Kathleene Hazel, MD;  Location: [facility] INVASIVE CV LAB;  Service: Cardiovascular;  Laterality: N/A;   ESOPHAGOGASTRODUODENOSCOPY  01/10/2012   [id number] in the distal esophagus2O TO GERD/Mild gastritis   LACERATION REPAIR     Right arm       Family History  Problem Relation Age of Onset   CAD Mother 59   CAD Father 36   Hypertension Brother    Colon cancer Neg Hx    Colon polyps Neg Hx    Esophageal cancer Neg Hx    Pancreatic cancer Neg Hx    Stomach cancer Neg Hx     Social History   Tobacco Use   Smoking status: Former    Packs/day: 0.00    Years: 15.00    Pack years: 0.00    Types: Cigarettes    Start date: 09/11/1983    Quit date: 08/30/2016    Years since quitting: 5.1   Smokeless tobacco: Never  Vaping Use   Vaping Use: Never used  Substance Use Topics   Alcohol use: Not Currently    Comment: occasional   Drug use: No    Home Medications Prior to Admission medications   Medication Sig Start Date End Date Taking? Authorizing Provider  aspirin EC 81 MG tablet Take 1 tablet (81 mg total) by mouth daily with breakfast. 09/21/19  Yes Emokpae, Courage, MD  insulin detemir (LEVEMIR) 100 UNIT/ML  injection Inject 0.12 mLs (12 Units total) into the skin at bedtime. Please Give 90-day supply Patient taking differently: Inject 40-80 Units into the skin at bedtime. Please Give 90-day supply 09/21/19 10/11/21 Yes Roxan Hockey, MD  metFORMIN (GLUMETZA) 500 MG (MOD) 24 hr tablet Take 2 tablets (1,000 mg total) by mouth 2 (two) times daily with a meal. 09/21/19  Yes Emokpae, Courage, MD  metoprolol succinate (TOPROL XL) 25 MG 24 hr tablet Take 0.5 tablets (12.5 mg total) by mouth daily. 07/26/20  Yes Satira Sark, MD  nitroGLYCERIN (NITROSTAT) 0.4 MG SL tablet Place 1 tablet (0.4 mg total) under the tongue every 5 (five) minutes as needed for chest pain. 07/26/20  Yes Satira Sark, MD  ezetimibe (ZETIA) 10 MG tablet Take  1 tablet (10 mg total) by mouth daily. Patient not taking: Reported on 10/11/2021 09/22/19   Roxan Hockey, MD  losartan (COZAAR) 25 MG tablet Take 1 tablet (25 mg total) by mouth daily. For anxiety Patient not taking: Reported on 10/11/2021 01/12/20   Erma Heritage, PA-C    Allergies    Insulin glargine, Lortab [hydrocodone-acetaminophen], Lipitor [atorvastatin calcium], Hydrocodone, and Iodinated diagnostic agents  Review of Systems   Review of Systems  Constitutional:  Negative for chills and fever.  HENT:  Negative for ear pain and sore throat.   Eyes:  Negative for pain and visual disturbance.  Respiratory:  Positive for shortness of breath. Negative for cough.   Cardiovascular:  Positive for chest pain. Negative for palpitations.  Gastrointestinal:  Negative for abdominal pain and vomiting.  Genitourinary:  Negative for dysuria and hematuria.  Musculoskeletal:  Negative for arthralgias and back pain.  Skin:  Negative for color change and rash.  Neurological:  Negative for seizures and syncope.  All other systems reviewed and are negative.  Physical Exam Updated Vital Signs BP (!) 147/103   Pulse 100   Temp 98.1 F (36.7 C) (Oral)   Resp 14   Ht 1.753 m (5\' 9" )   Wt 86.2 kg   SpO2 98%   BMI 28.06 kg/m   Physical Exam Vitals and nursing note reviewed.  Constitutional:      General: He is not in acute distress.    Appearance: Normal appearance. He is well-developed.  HENT:     Head: Normocephalic and atraumatic.  Eyes:     Conjunctiva/sclera: Conjunctivae normal.  Cardiovascular:     Rate and Rhythm: Regular rhythm. Tachycardia present.     Heart sounds: No murmur heard. Pulmonary:     Effort: Pulmonary effort is normal. No respiratory distress.     Breath sounds: Normal breath sounds. No wheezing or rhonchi.  Abdominal:     Palpations: Abdomen is soft.     Tenderness: There is no abdominal tenderness.  Musculoskeletal:        General: No swelling.      Cervical back: Neck supple.     Right lower leg: No edema.     Left lower leg: No edema.  Skin:    General: Skin is warm and dry.     Capillary Refill: Capillary refill takes less than 2 seconds.  Neurological:     Mental Status: He is alert.  Psychiatric:        Mood and Affect: Mood normal.    ED Results / Procedures / Treatments   Labs (all labs ordered are listed, but only abnormal results are displayed) Labs Reviewed  BASIC METABOLIC PANEL - Abnormal; Notable for the following  components:      Result Value   Glucose, Bld 300 (*)    All other components within normal limits  CBC  TROPONIN I (HIGH SENSITIVITY)  TROPONIN I (HIGH SENSITIVITY)    EKG EKG Interpretation  Date/Time:  Thursday October 11 2021 09:07:21 EST Ventricular Rate:  118 PR Interval:  110 QRS Duration: 91 QT Interval:  299 QTC Calculation: 419 R Axis:   65 Text Interpretation: Sinus tachycardia Paired ventricular premature complexes Aberrant complex LAE, consider biatrial enlargement Borderline repolarization abnormality No significant change since last tracing Confirmed by Fredia Sorrow [phone]) on 10/11/2021 9:44:36 AM  Radiology DG Chest 2 View  Result Date: 10/11/2021 CLINICAL DATA:  Chest pain short of breath EXAM: CHEST - 2 VIEW COMPARISON:  09/20/2019 FINDINGS: The heart size and mediastinal contours are within normal limits. Left coronary stent noted. Both lungs are clear. The visualized skeletal structures are unremarkable. IMPRESSION: No active cardiopulmonary disease. Electronically Signed   By: Franchot Gallo M.D.   On: 10/11/2021 09:39   CT Angio Chest PE W/Cm &/Or Wo Cm  Result Date: 10/11/2021 CLINICAL DATA:  Chest pain EXAM: CT ANGIOGRAPHY CHEST WITH CONTRAST TECHNIQUE: Multidetector CT imaging of the chest was performed using the standard protocol during bolus administration of intravenous contrast. Multiplanar CT image reconstructions and MIPs were obtained to evaluate the  vascular anatomy. CONTRAST:  55mL OMNIPAQUE IOHEXOL 350 MG/ML SOLN COMPARISON:  Chest radiograph performed on the same date FINDINGS: Cardiovascular: Satisfactory opacification of the pulmonary arteries to the segmental level. No evidence of pulmonary embolism. Normal heart size. Vascular stent in the circumflex artery no pericardial effusion. Mediastinum/Nodes: No enlarged mediastinal, hilar, or axillary lymph nodes. Thyroid gland, trachea, and esophagus demonstrate no significant findings. Lungs/Pleura: Lungs are clear. No pleural effusion or pneumothorax. Upper Abdomen: No acute abnormality. Musculoskeletal: No chest wall abnormality. No acute or significant osseous findings. Review of the MIP images confirms the above findings. IMPRESSION: 1.  No evidence of pulmonary embolism. 2.  No acute cardiopulmonary process. Electronically Signed   By: Keane Police D.O.   On: 10/11/2021 11:16    Procedures Procedures   Medications Ordered in ED Medications  0.9 %  sodium chloride infusion ( Intravenous New Bag/Given 10/11/21 1157)  sodium chloride 0.9 % bolus 1,000 mL (1,000 mLs Intravenous New Bag/Given 10/11/21 1032)  ondansetron (ZOFRAN) injection 4 mg (4 mg Intravenous Given 10/11/21 1022)  morphine 2 MG/ML injection 2 mg (2 mg Intravenous Given 10/11/21 1022)  LORazepam (ATIVAN) injection 1 mg (1 mg Intravenous Given 10/11/21 1022)  iohexol (OMNIPAQUE) 350 MG/ML injection 75 mL (75 mLs Intravenous Contrast Given 10/11/21 1040)  diphenhydrAMINE (BENADRYL) injection 25 mg (25 mg Intravenous Given 10/11/21 1109)  famotidine (PEPCID) IVPB 20 mg premix (20 mg Intravenous New Bag/Given 10/11/21 1112)    ED Course  I have reviewed the triage vital signs and the nursing notes.  Pertinent labs & imaging results that were available during my care of the patient were reviewed by me and considered in my medical decision making (see chart for details).    MDM Rules/Calculators/A&P                            Patient's initial troponin normal which is reassuring based on the duration.  No leukocytosis hemoglobin is normal.  Basic metabolic panel shows normal renal function.  Blood sugar is elevated at 300 but CO2 is 23.  EKG without any acute changes.  Chest  x-ray no active cardiopulmonary disease.  Will get delta troponin.  Since there is a degree of shortness of breath and some tachycardia even though oxygen sats are good we will get CT angio chest to rule out pulmonary embolus.  If second troponin is normal most likely can go home with close follow-up with cardiology.  But probably will discuss with cardiology here.   Patient raise some concerns about maybe being secondary to anxiety.  We will give a dose of morphine and Ativan here while awaiting the second troponin and the CT angio chest.  Patient's chest pain went away with morphine.  Had a discussion with Dr. Domenic Polite cardiology he is recommending admission for observation and cardiology will see him in consultation.  It is reassuring that the 2 troponins are normal but he had pain up until receiving the morphine and his pain would go away with nitroglycerin.  CT angio chest without evidence of pulmonary embolism no evidence of any acute cardiopulmonary process.  Will discuss with hospitalist for admission.   Final Clinical Impression(s) / ED Diagnoses Final diagnoses:  Precordial pain    Rx / DC Orders ED Discharge Orders     None        Fredia Sorrow, MD 10/11/21 1229

## 2021-10-11 NOTE — Progress Notes (Signed)
ANTICOAGULATION CONSULT NOTE   Pharmacy Consult for heparin Indication: chest pain/ACS  Allergies  Allergen Reactions   Insulin Glargine Anaphylaxis    Anaphylaxis    Lortab [Hydrocodone-Acetaminophen] Itching   Lipitor [Atorvastatin Calcium] Other (See Comments)    Extreme muscle weakness, passing out, generalized pain all through body   Hydrocodone Itching   Iodinated Diagnostic Agents Itching    Pt given contrast and began itching after injection.     Patient Measurements: Height: 5\' 9"  (175.3 cm) Weight: 89.8 kg (197 lb 15.6 oz) IBW/kg (Calculated) : 70.7 Heparin Dosing Weight: 86 kg  Vital Signs: Temp: 97.6 F (36.4 C) (11/17 2300) Temp Source: Oral (11/17 2300) BP: 107/61 (11/17 2300) Pulse Rate: 82 (11/17 2300)  Labs: Recent Labs    10/11/21 0913 10/11/21 1104 10/11/21 2154  HGB 16.2  --   --   HCT 45.8  --   --   PLT 344  --   --   HEPARINUNFRC  --   --  <0.10*  CREATININE 0.81  --   --   TROPONINIHS 3 3  --      Estimated Creatinine Clearance: 116.8 mL/min (by C-G formula based on SCr of 0.81 mg/dL).   Medical History: Past Medical History:  Diagnosis Date   CAD (coronary artery disease)    a. 08/2016: cath showing 40% prox RCA, 10% LCx, 20% LAD and 99% RI stenosis. DES to RI.   COVID-14 October 2019 - was not hospitalized   Essential hypertension    GERD (gastroesophageal reflux disease)    History of nephrolithiasis    Mixed hyperlipidemia    Type 2 diabetes mellitus (HCC)     Medications:  Medications Prior to Admission  Medication Sig Dispense Refill Last Dose   aspirin EC 81 MG tablet Take 1 tablet (81 mg total) by mouth daily with breakfast. 30 tablet 3 10/10/2021   insulin detemir (LEVEMIR) 100 UNIT/ML injection Inject 0.12 mLs (12 Units total) into the skin at bedtime. Please Give 90-day supply (Patient taking differently: Inject 40-80 Units into the skin at bedtime. Please Give 90-day supply) 10 mL 3 10/10/2021   metFORMIN  (GLUMETZA) 500 MG (MOD) 24 hr tablet Take 2 tablets (1,000 mg total) by mouth 2 (two) times daily with a meal. 120 tablet 5 10/10/2021   metoprolol succinate (TOPROL XL) 25 MG 24 hr tablet Take 0.5 tablets (12.5 mg total) by mouth daily. 45 tablet 3 10/10/2021 at 2100   nitroGLYCERIN (NITROSTAT) 0.4 MG SL tablet Place 1 tablet (0.4 mg total) under the tongue every 5 (five) minutes as needed for chest pain. 30 tablet 4 10/10/2021 at 2100   ezetimibe (ZETIA) 10 MG tablet Take 1 tablet (10 mg total) by mouth daily. (Patient not taking: Reported on 10/11/2021) 30 tablet 3 Not Taking   losartan (COZAAR) 25 MG tablet Take 1 tablet (25 mg total) by mouth daily. For anxiety (Patient not taking: Reported on 10/11/2021) 30 tablet 4 Not Taking    Assessment: Pharmacy consulted to dose heparin in patient with chest pain/ACS.  Patient is not anticoagulation prior to admission.  CBC WNL  11/17 PM update:  Heparin level undetectable Transfer from [facility] for likely cath  Goal of Therapy:  Heparin level 0.3-0.7 units/ml Monitor platelets by anticoagulation protocol: Yes   Plan:  -Heparin 2000 units re-bolus -Inc heparin to 1200 units/hr -0800 heparin level  [facility], PharmD, BCPS Clinical Pharmacist Phone: [phone]

## 2021-10-11 NOTE — ED Triage Notes (Signed)
Pt to the ED with complaints of chest pain since last night. The left side pain is described as pressure. Pt has a HX of stent and MI.  Pt states he took nitro 3 times yesterday. He is also having issues with keeping his CBG in normal range.

## 2021-10-11 NOTE — Progress Notes (Signed)
ANTICOAGULATION CONSULT NOTE - Initial Consult  Pharmacy Consult for heparin Indication: chest pain/ACS  Allergies  Allergen Reactions   Insulin Glargine Anaphylaxis    Anaphylaxis    Lortab [Hydrocodone-Acetaminophen] Itching   Lipitor [Atorvastatin Calcium] Other (See Comments)    Extreme muscle weakness, passing out, generalized pain all through body   Hydrocodone Itching   Iodinated Diagnostic Agents Itching    Pt given contrast and began itching after injection.     Patient Measurements: Height: 5\' 9"  (175.3 cm) Weight: 86.2 kg (190 lb) IBW/kg (Calculated) : 70.7 Heparin Dosing Weight: 86 kg  Vital Signs: Temp: 98.1 F (36.7 C) (11/17 0858) Temp Source: Oral (11/17 0858) BP: 142/88 (11/17 1230) Pulse Rate: 110 (11/17 1230)  Labs: Recent Labs    10/11/21 0913 10/11/21 1104  HGB 16.2  --   HCT 45.8  --   PLT 344  --   CREATININE 0.81  --   TROPONINIHS 3 3    Estimated Creatinine Clearance: 114.7 mL/min (by C-G formula based on SCr of 0.81 mg/dL).   Medical History: Past Medical History:  Diagnosis Date   CAD (coronary artery disease)    a. 08/2016: cath showing 40% prox RCA, 10% LCx, 20% LAD and 99% RI stenosis. DES to RI.   COVID-14 October 2019 - was not hospitalized   Essential hypertension    GERD (gastroesophageal reflux disease)    History of nephrolithiasis    Mixed hyperlipidemia    Type 2 diabetes mellitus (HCC)     Medications:  (Not in a hospital admission)   Assessment: Pharmacy consulted to dose heparin in patient with chest pain/ACS.  Patient is not anticoagulation prior to admission.  CBC WNL  Goal of Therapy:  Heparin level 0.3-0.7 units/ml Monitor platelets by anticoagulation protocol: Yes   Plan:  Give 4000 units bolus x 1 Start heparin infusion at 1000 units/hr Check anti-Xa level in 6 hours and daily while on heparin Continue to monitor H&H and platelets  December 2020, PharmD Clinical Pharmacist 10/11/2021 2:50  PM

## 2021-10-12 ENCOUNTER — Encounter (HOSPITAL_COMMUNITY)
Admission: EM | Disposition: A | Discharge status: Home / Self Care | Payer: Self-pay | Source: Home / Self Care | Attending: Emergency Medicine

## 2021-10-12 DIAGNOSIS — I25119 Atherosclerotic heart disease of native coronary artery with unspecified angina pectoris: Secondary | ICD-10-CM

## 2021-10-12 DIAGNOSIS — K2100 Gastro-esophageal reflux disease with esophagitis, without bleeding: Secondary | ICD-10-CM

## 2021-10-12 DIAGNOSIS — I208 Other forms of angina pectoris: Secondary | ICD-10-CM | POA: Diagnosis not present

## 2021-10-12 DIAGNOSIS — F064 Anxiety disorder due to known physiological condition: Secondary | ICD-10-CM | POA: Diagnosis not present

## 2021-10-12 DIAGNOSIS — I2510 Atherosclerotic heart disease of native coronary artery without angina pectoris: Secondary | ICD-10-CM | POA: Diagnosis not present

## 2021-10-12 DIAGNOSIS — I2089 Other forms of angina pectoris: Secondary | ICD-10-CM

## 2021-10-12 DIAGNOSIS — I10 Essential (primary) hypertension: Secondary | ICD-10-CM | POA: Diagnosis not present

## 2021-10-12 DIAGNOSIS — E119 Type 2 diabetes mellitus without complications: Secondary | ICD-10-CM | POA: Diagnosis not present

## 2021-10-12 DIAGNOSIS — E0800 Diabetes mellitus due to underlying condition with hyperosmolarity without nonketotic hyperglycemic-hyperosmolar coma (NKHHC): Secondary | ICD-10-CM | POA: Diagnosis not present

## 2021-10-12 DIAGNOSIS — Z20822 Contact with and (suspected) exposure to covid-19: Secondary | ICD-10-CM | POA: Diagnosis not present

## 2021-10-12 HISTORY — PX: CORONARY PRESSURE/FFR STUDY: CATH118243

## 2021-10-12 HISTORY — PX: LEFT HEART CATH AND CORS/GRAFTS ANGIOGRAPHY: CATH118250

## 2021-10-12 HISTORY — PX: INTRAVASCULAR PRESSURE WIRE/FFR STUDY: CATH118243

## 2021-10-12 LAB — HEPARIN LEVEL (UNFRACTIONATED)
Heparin Unfractionated: 0.29 IU/mL — ABNORMAL LOW (ref 0.30–0.70)
Heparin Unfractionated: >1.1 IU/mL — ABNORMAL HIGH (ref 0.30–0.70)

## 2021-10-12 LAB — GLUCOSE, CAPILLARY
Glucose-Capillary: 165 mg/dL — ABNORMAL HIGH (ref 70–99)
Glucose-Capillary: 194 mg/dL — ABNORMAL HIGH (ref 70–99)
Glucose-Capillary: 227 mg/dL — ABNORMAL HIGH (ref 70–99)
Glucose-Capillary: 352 mg/dL — ABNORMAL HIGH (ref 70–99)

## 2021-10-12 LAB — POCT ACTIVATED CLOTTING TIME: Activated Clotting Time: 294 seconds

## 2021-10-12 LAB — TROPONIN I (HIGH SENSITIVITY): Troponin I (High Sensitivity): 4 ng/L (ref <18)

## 2021-10-12 SURGERY — LEFT HEART CATH AND CORS/GRAFTS ANGIOGRAPHY
Anesthesia: LOCAL

## 2021-10-12 MED ORDER — BUSPIRONE HCL 10 MG PO TABS
10.0000 mg | ORAL_TABLET | Freq: Every day | ORAL | Status: DC
  Administered 2021-10-12 – 2021-10-13 (×2): 10 mg via ORAL
  Filled 2021-10-12 (×2): qty 1

## 2021-10-12 MED ORDER — VERAPAMIL HCL 2.5 MG/ML IV SOLN
INTRAVENOUS | Status: DC | PRN
  Administered 2021-10-12: 10 mL via INTRA_ARTERIAL

## 2021-10-12 MED ORDER — HEPARIN SODIUM (PORCINE) 1000 UNIT/ML IJ SOLN
INTRAMUSCULAR | Status: AC
  Filled 2021-10-12: qty 1

## 2021-10-12 MED ORDER — MELATONIN 5 MG PO TABS
5.0000 mg | ORAL_TABLET | Freq: Every day | ORAL | Status: DC
  Administered 2021-10-12: 5 mg via ORAL
  Filled 2021-10-12: qty 1

## 2021-10-12 MED ORDER — LIDOCAINE HCL (PF) 1 % IJ SOLN
INTRAMUSCULAR | Status: DC | PRN
  Administered 2021-10-12: 2 mL

## 2021-10-12 MED ORDER — LORAZEPAM 2 MG/ML IJ SOLN
1.0000 mg | Freq: Once | INTRAMUSCULAR | Status: AC
  Administered 2021-10-12: 1 mg via INTRAVENOUS
  Filled 2021-10-12: qty 1

## 2021-10-12 MED ORDER — MIDAZOLAM HCL 2 MG/2ML IJ SOLN
INTRAMUSCULAR | Status: AC
  Filled 2021-10-12: qty 2

## 2021-10-12 MED ORDER — HEPARIN (PORCINE) IN NACL 1000-0.9 UT/500ML-% IV SOLN
INTRAVENOUS | Status: DC | PRN
  Administered 2021-10-12 (×2): 500 mL

## 2021-10-12 MED ORDER — ISOSORBIDE MONONITRATE ER 30 MG PO TB24
30.0000 mg | ORAL_TABLET | Freq: Every day | ORAL | Status: DC
  Administered 2021-10-12 – 2021-10-13 (×2): 30 mg via ORAL
  Filled 2021-10-12 (×2): qty 1

## 2021-10-12 MED ORDER — METHYLPREDNISOLONE SODIUM SUCC 125 MG IJ SOLR
125.0000 mg | Freq: Once | INTRAMUSCULAR | Status: AC
  Administered 2021-10-12: 125 mg via INTRAVENOUS
  Filled 2021-10-12: qty 2

## 2021-10-12 MED ORDER — ADENOSINE (DIAGNOSTIC) 140MCG/KG/MIN
INTRAVENOUS | Status: DC | PRN
  Administered 2021-10-12: 140 ug/kg/min via INTRAVENOUS

## 2021-10-12 MED ORDER — HEPARIN SODIUM (PORCINE) 1000 UNIT/ML IJ SOLN
INTRAMUSCULAR | Status: DC | PRN
  Administered 2021-10-12: 5000 [IU] via INTRAVENOUS
  Administered 2021-10-12: 4500 [IU] via INTRAVENOUS

## 2021-10-12 MED ORDER — VERAPAMIL HCL 2.5 MG/ML IV SOLN
INTRAVENOUS | Status: AC
  Filled 2021-10-12: qty 2

## 2021-10-12 MED ORDER — SODIUM CHLORIDE 0.9% FLUSH
3.0000 mL | Freq: Two times a day (BID) | INTRAVENOUS | Status: DC
  Administered 2021-10-12 – 2021-10-13 (×2): 3 mL via INTRAVENOUS

## 2021-10-12 MED ORDER — ADENOSINE 12 MG/4ML IV SOLN
INTRAVENOUS | Status: AC
  Filled 2021-10-12: qty 16

## 2021-10-12 MED ORDER — LIDOCAINE HCL (PF) 1 % IJ SOLN
INTRAMUSCULAR | Status: AC
  Filled 2021-10-12: qty 30

## 2021-10-12 MED ORDER — SODIUM CHLORIDE 0.9% FLUSH: 3.0000 mL | INTRAVENOUS | Status: DC | PRN

## 2021-10-12 MED ORDER — LABETALOL HCL 5 MG/ML IV SOLN: 10.0000 mg | INTRAVENOUS | Status: AC | PRN

## 2021-10-12 MED ORDER — FENTANYL CITRATE (PF) 100 MCG/2ML IJ SOLN
INTRAMUSCULAR | Status: AC
  Filled 2021-10-12: qty 2

## 2021-10-12 MED ORDER — ONDANSETRON HCL 4 MG/2ML IJ SOLN: 4.0000 mg | Freq: Four times a day (QID) | INTRAMUSCULAR | Status: DC | PRN

## 2021-10-12 MED ORDER — SODIUM CHLORIDE 0.9 % IV SOLN: 250.0000 mL | INTRAVENOUS | Status: DC | PRN

## 2021-10-12 MED ORDER — METOPROLOL SUCCINATE ER 25 MG PO TB24
25.0000 mg | ORAL_TABLET | Freq: Every day | ORAL | Status: DC
  Administered 2021-10-13: 25 mg via ORAL
  Filled 2021-10-12: qty 1

## 2021-10-12 MED ORDER — DIPHENHYDRAMINE HCL 50 MG/ML IJ SOLN
25.0000 mg | Freq: Once | INTRAMUSCULAR | Status: AC
  Administered 2021-10-12: 25 mg via INTRAVENOUS
  Filled 2021-10-12: qty 1

## 2021-10-12 MED ORDER — HEPARIN (PORCINE) IN NACL 1000-0.9 UT/500ML-% IV SOLN
INTRAVENOUS | Status: AC
  Filled 2021-10-12: qty 1000

## 2021-10-12 MED ORDER — SODIUM CHLORIDE 0.9 % IV SOLN: INTRAVENOUS | Status: AC

## 2021-10-12 MED ORDER — IOHEXOL 350 MG/ML SOLN
INTRAVENOUS | Status: DC | PRN
  Administered 2021-10-12: 95 mL

## 2021-10-12 MED ORDER — FENTANYL CITRATE (PF) 100 MCG/2ML IJ SOLN
INTRAMUSCULAR | Status: DC | PRN
  Administered 2021-10-12: 25 ug via INTRAVENOUS

## 2021-10-12 MED ORDER — MIDAZOLAM HCL 2 MG/2ML IJ SOLN
INTRAMUSCULAR | Status: DC | PRN
  Administered 2021-10-12: 1 mg via INTRAVENOUS

## 2021-10-12 SURGICAL SUPPLY — 13 items
CATH INFINITI 5FR MULTPACK ANG (CATHETERS) ×1 IMPLANT
CATH LAUNCHER 6FR EBU3.5 (CATHETERS) ×1 IMPLANT
DEVICE RAD COMP TR BAND LRG (VASCULAR PRODUCTS) ×1 IMPLANT
GLIDESHEATH SLEND SS 6F .021 (SHEATH) ×1 IMPLANT
GUIDEWIRE INQWIRE 1.5J.035X260 (WIRE) IMPLANT
GUIDEWIRE PRESSURE X 175 (WIRE) ×1 IMPLANT
INQWIRE 1.5J .035X260CM (WIRE) ×2
KIT ESSENTIALS PG (KITS) ×1 IMPLANT
KIT HEART LEFT (KITS) ×2 IMPLANT
PACK CARDIAC CATHETERIZATION (CUSTOM PROCEDURE TRAY) ×2 IMPLANT
SHEATH PROBE COVER 6X72 (BAG) ×1 IMPLANT
TRANSDUCER W/STOPCOCK (MISCELLANEOUS) ×2 IMPLANT
TUBING CIL FLEX 10 FLL-RA (TUBING) ×2 IMPLANT

## 2021-10-12 NOTE — Progress Notes (Signed)
Inpatient Diabetes Program Recommendations  AACE/ADA: New Consensus Statement on Inpatient Glycemic Control   Target Ranges:  Prepandial:   less than 140 mg/dL      Peak postprandial:   less than 180 mg/dL (1-2 hours)      Critically ill patients:  140 - 180 mg/dL    Latest Reference Range & Units 10/11/21 15:02 10/11/21 21:27 10/12/21 06:14 10/12/21 11:18  Glucose-Capillary 70 - 99 mg/dL 80 287 (H) 681 (H) 157 (H)  (H): Data is abnormally high  Latest Reference Range & Units 10/11/21 09:13  Hemoglobin A1C 4.8 - 5.6 % 9.8 (H)  (H): Data is abnormally high  Review of Glycemic Control  Diabetes history: DM2 Outpatient Diabetes medications: Levemir 60-80 units daily, Metformin 1000 mg BID Current orders for Inpatient glycemic control: Levemir 45 units daily, Novolog 0-15 units TID with meals, Novolog 0-5 units QHS  Inpatient Diabetes Program Recommendations:    HbgA1C: A1C 9.8% on 10/11/21 indicating an average glucose of 235 mg/dl over the past 2-3 months.  Outpatient DM medications: At time of discharge, may want to consider prescribing Novolog Flexpen [phone]) correction scale 0-15 units TID with meals; will likely need additional insulin pen needles as well (#[id number]).  Note: Spoke with patient and wife about diabetes and home regimen for diabetes control. Patient reports being followed by PCP (recently started seeing PCP at [facility] in [place]) and reports that no changes have been made with DM medications recently. Patient states that he is taking Levemir 60-80 units daily and Metformin 1000 mg BID for diabetes management. Patient reports that when he takes his Levemir he gives himself multiple injections (gives about 15 units per injection) because he states that if he takes the full amount of 30 or 40 units that it leaves a big knot under the skin. Patient states that he takes Levemir 30-40 units around lunch and then another 30-40 units around supper if glucose is elevated. Patient  states that his glucose is usually 200-300's mg/dl. Patient reports that he starts getting symptoms of hypoglycemia when glucose is less than 160-165 mg/dl and notes that he wakes up during the night sometimes with glucose of 60-70's mg/dl.   Discussed A1C results (9.8% on 10/11/21) and explained that current A1C indicates an average glucose of 235 mg/dl over the past 2-3 months. Discussed glucose and A1C goals. Discussed importance of checking CBGs and maintaining good CBG control to prevent long-term and short-term complications. Explained how hyperglycemia leads to damage within blood vessels which lead to the common complications seen with uncontrolled diabetes. Stressed to the patient the importance of improving glycemic control to prevent further complications from uncontrolled diabetes. Discussed impact of nutrition, exercise, stress, sickness, and medications on diabetes control.  Discussed Levemir insulin and how it works, encouraged patient to take Levemir doses about 12 hours apart if taking BID. Inquired about ever using short acting insulin and patient states that one of his prior providers would not prescribe it due to fear that his glucose would drop too much.  Patient is very concerned about DM control. Encouraged patient to get established with local Endocrinologist; patient prefers to find one in [place]. Informed patient that Dr. Fransico Him is in [place] and that his contact information would be added to discharge instructions. Encouraged him to call on Monday and get an appointment to establish care. Patient states that he is fairly active at work and is constantly checking glucose. Inquired about any knowledge of FreeStyle Libre2 and explained what it is  and how it works to provide more information on glucose trends but also has alarms to alert him if glucose is low or high (depending on glucose parameters set for each). Patient would like to use FreeStyle Libre2 and Dr. Isidoro Donning provided  order to give patient samples of FreeStyle Libre2. Went over step by step directions on how to apply and use FreeStyle Libre2 CGM and provided 2 sample sensors. Patient is to have cath today so will not apply sensor at this time. Discussed using Novolog correction for hyperglycemia and patient is willing to use Novolog insulin if prescribed at discharge. Encouraged patient to keep a detailed record of glucose readings and exact insulin dosages taken so that Dr. Fransico Him can use the information to continue to make additional changes with DM medications if needed.  Patient verbalized understanding of information discussed and reports no further questions at this time related to diabetes.  Thanks, Orlando Penner, RN, MSN, CDE Diabetes Coordinator Inpatient Diabetes Program [phone] (Team Pager)

## 2021-10-12 NOTE — Progress Notes (Signed)
ANTICOAGULATION CONSULT NOTE  Pharmacy Consult for Heparin Indication: chest pain/ACS  Allergies  Allergen Reactions   Insulin Glargine Anaphylaxis    Anaphylaxis    Lortab [Hydrocodone-Acetaminophen] Itching   Lipitor [Atorvastatin Calcium] Other (See Comments)    Extreme muscle weakness, passing out, generalized pain all through body   Hydrocodone Itching   Iodinated Diagnostic Agents Itching    Pt given contrast and began itching after injection.     Patient Measurements: Height: 5\' 9"  (175.3 cm) Weight: 89.8 kg (197 lb 15.6 oz) IBW/kg (Calculated) : 70.7  Heparin Dosing Weight: 86 kg  Vital Signs: Temp: 97.6 F (36.4 C) (11/18 0409) Temp Source: Oral (11/18 0409) BP: 114/83 (11/18 0409) Pulse Rate: 85 (11/18 0409)  Labs: Recent Labs    10/11/21 0913 10/11/21 1104 10/11/21 2154 10/12/21 0555  HGB 16.2  --   --   --   HCT 45.8  --   --   --   PLT 344  --   --   --   HEPARINUNFRC  --   --  <0.10*  --   CREATININE 0.81  --   --   --   TROPONINIHS 3 3  --  4    Estimated Creatinine Clearance: 116.8 mL/min (by C-G formula based on SCr of 0.81 mg/dL).   Medications:  Scheduled:   [START ON 10/13/2021] aspirin EC  81 mg Oral Daily   insulin aspart  0-15 Units Subcutaneous TID WC   insulin aspart  0-5 Units Subcutaneous QHS   insulin detemir  45 Units Subcutaneous Daily   methylPREDNISolone sodium succinate  125 mg Intravenous Once   metoprolol succinate  12.5 mg Oral Daily   nitroGLYCERIN  0.4 mg Transdermal Daily   pantoprazole  40 mg Oral Daily   sodium chloride flush  3 mL Intravenous Q12H   Infusions:   sodium chloride 100 mL/hr at 10/12/21 0000   sodium chloride     sodium chloride 1 mL/kg/hr (10/12/21 0505)   heparin 1,200 Units/hr (10/12/21 0409)    Assessment: 31 YOM  with medical history significant for CAD, NSTEMI s/p DES in 2017, hypertension, hyperlipidemia, type 2 diabetes mellitus, and GERD who presented with chest pain. Pharmacy  consulted for heparin management for ACS. Patient was not on anticoagulation PTA.  11/17 CBC stable. No s/sx bleeding reported. Heparin level now slightly subtherapeutic at 0.29. Plan is for patient to go for Cath later this morning. Will increase rate and recheck heparin level in 6 hours or after cath procedure if resumed.  Goal of Therapy:  Heparin level 0.3-0.7 units/ml Monitor platelets by anticoagulation protocol: Yes   Plan:  Increase heparin infusion to 1400 units/hr Check heparin level in 6 hours and daily while on heparin Continue to monitor H&H and platelets F/u plan for heparin after cath    Thank you for allowing pharmacy to be a part of this patient's care.  12/17, PharmD Clinical Pharmacist

## 2021-10-12 NOTE — Discharge Instructions (Signed)
Endocrinologist in [place], [place]  Dr. Marquis Lunch Address: [address], [place], [place] [address] Phone: [phone]

## 2021-10-12 NOTE — H&P (View-Only) (Signed)
 Progress Note  Patient Name: Shawn Hughes Date of Encounter: 10/12/2021  Primary Cardiologist: Samuel McDowell, MD   Subjective   Mild chest pain returning now, response to morphine. Requesting ativan.   Inpatient Medications    Scheduled Meds:  [START ON 10/13/2021] aspirin EC  81 mg Oral Daily   busPIRone  10 mg Oral Daily   insulin aspart  0-15 Units Subcutaneous TID WC   insulin aspart  0-5 Units Subcutaneous QHS   insulin detemir  45 Units Subcutaneous Daily   LORazepam  1 mg Intravenous Once   methylPREDNISolone sodium succinate  125 mg Intravenous Once   metoprolol succinate  12.5 mg Oral Daily   nitroGLYCERIN  0.4 mg Transdermal Daily   pantoprazole  40 mg Oral Daily   sodium chloride flush  3 mL Intravenous Q12H   Continuous Infusions:  sodium chloride 100 mL/hr at 10/12/21 0000   sodium chloride     sodium chloride 1 mL/kg/hr (10/12/21 0505)   heparin 1,400 Units/hr (10/12/21 1030)   PRN Meds: sodium chloride, acetaminophen, diphenhydrAMINE, morphine injection, nitroGLYCERIN, sodium chloride flush   Vital Signs    Vitals:   10/11/21 2106 10/11/21 2300 10/12/21 0000 10/12/21 0409  BP: 125/78 107/61 106/64 114/83  Pulse: 96 82 86 85  Resp: 18 13 14 15  Temp: 98.4 F (36.9 C) 97.6 F (36.4 C)  97.6 F (36.4 C)  TempSrc: Oral Oral  Oral  SpO2: 97% 96% 90% 98%  Weight: 89.8 kg     Height: 5' 9" (1.753 m)       Intake/Output Summary (Last 24 hours) at 10/12/2021 1054 Last data filed at 10/12/2021 0409 Gross per 24 hour  Intake 2809.94 ml  Output --  Net 2809.94 ml   Filed Weights   10/11/21 0900 10/11/21 2106  Weight: 86.2 kg 89.8 kg    Telemetry    SR - Personally Reviewed  ECG    Sinus tachycardia rate 118, nonspecific ST abnl - Personally Reviewed  Physical Exam   GEN: No acute distress.   Neck: No JVD Cardiac: regular rhythm, normal rate, no murmurs, rubs, or gallops.  Respiratory: Clear to auscultation bilaterally. GI:  Soft, nontender, non-distended  MS: No edema; No deformity. Neuro:  Nonfocal  Psych: Normal affect   Labs    Chemistry Recent Labs  Lab 10/11/21 0913  NA 135  K 4.0  CL 102  CO2 23  GLUCOSE 300*  BUN 12  CREATININE 0.81  CALCIUM 9.2  GFRNONAA >60  ANIONGAP 10     Hematology Recent Labs  Lab 10/11/21 0913  WBC 8.8  RBC 4.83  HGB 16.2  HCT 45.8  MCV 94.8  MCH 33.5  MCHC 35.4  RDW 12.6  PLT 344    Cardiac EnzymesNo results for input(s): TROPONINI in the last 168 hours. No results for input(s): TROPIPOC in the last 168 hours.   BNPNo results for input(s): BNP, PROBNP in the last 168 hours.   DDimer No results for input(s): DDIMER in the last 168 hours.   Radiology    DG Chest 2 View  Result Date: 10/11/2021 CLINICAL DATA:  Chest pain short of breath EXAM: CHEST - 2 VIEW COMPARISON:  09/20/2019 FINDINGS: The heart size and mediastinal contours are within normal limits. Left coronary stent noted. Both lungs are clear. The visualized skeletal structures are unremarkable. IMPRESSION: No active cardiopulmonary disease. Electronically Signed   By: Charles  Clark M.D.   On: 10/11/2021 09:39   CT Angio   Chest PE W/Cm &/Or Wo Cm  Result Date: 10/11/2021 CLINICAL DATA:  Chest pain EXAM: CT ANGIOGRAPHY CHEST WITH CONTRAST TECHNIQUE: Multidetector CT imaging of the chest was performed using the standard protocol during bolus administration of intravenous contrast. Multiplanar CT image reconstructions and MIPs were obtained to evaluate the vascular anatomy. CONTRAST:  56mL OMNIPAQUE IOHEXOL 350 MG/ML SOLN COMPARISON:  Chest radiograph performed on the same date FINDINGS: Cardiovascular: Satisfactory opacification of the pulmonary arteries to the segmental level. No evidence of pulmonary embolism. Normal heart size. Vascular stent in the circumflex artery no pericardial effusion. Mediastinum/Nodes: No enlarged mediastinal, hilar, or axillary lymph nodes. Thyroid gland, trachea, and  esophagus demonstrate no significant findings. Lungs/Pleura: Lungs are clear. No pleural effusion or pneumothorax. Upper Abdomen: No acute abnormality. Musculoskeletal: No chest wall abnormality. No acute or significant osseous findings. Review of the MIP images confirms the above findings. IMPRESSION: 1.  No evidence of pulmonary embolism. 2.  No acute cardiopulmonary process. Electronically Signed   By: Larose Hires D.O.   On: 10/11/2021 11:16   ECHOCARDIOGRAM COMPLETE  Result Date: 10/11/2021    ECHOCARDIOGRAM REPORT   Patient Name:   Shawn Hughes Date of Exam: 10/11/2021 Medical Rec #:  [id number]        Height:       69.0 in Accession #:    [id number]       Weight:       190.0 lb Date of Birth:  1966-09-04        BSA:          2.021 m Patient Age:    55 years         BP:           142/102 mmHg Patient Gender: M                HR:           97 bpm. [facility] Procedure: 2D Echo, Cardiac Doppler and Color Doppler Indications:    Chest Pain  History:        Patient has prior history of Echocardiogram examinations, most                 recent 09/21/2019. Previous Myocardial Infarction; Risk                 Factors:Current Smoker, Hypertension, Diabetes and Dyslipidemia.  Sonographer:    Mikki Harbor Referring Phys: [id number] Dorothe Pea BRANCH IMPRESSIONS  1. Left ventricular ejection fraction, by estimation, is 55 to 60%. The left ventricle has normal function. The left ventricle has no regional wall motion abnormalities. There is mild left ventricular hypertrophy. Left ventricular diastolic parameters were normal.  2. Right ventricular systolic function is normal. The right ventricular size is normal. Tricuspid regurgitation signal is inadequate for assessing PA pressure.  3. The mitral valve is normal in structure. Trivial mitral valve regurgitation. No evidence of mitral stenosis.  4. The aortic valve is tricuspid. Aortic valve regurgitation is not visualized. No aortic stenosis is  present.  5. The inferior vena cava is normal in size with greater than 50% respiratory variability, suggesting right atrial pressure of 3 mmHg. FINDINGS  Left Ventricle: Left ventricular ejection fraction, by estimation, is 55 to 60%. The left ventricle has normal function. The left ventricle has no regional wall motion abnormalities. The left ventricular internal cavity size was normal in size. There is  mild left ventricular hypertrophy. Left ventricular diastolic parameters were normal. Right  Ventricle: The right ventricular size is normal. No increase in right ventricular wall thickness. Right ventricular systolic function is normal. Tricuspid regurgitation signal is inadequate for assessing PA pressure. Left Atrium: Left atrial size was normal in size. Right Atrium: Right atrial size was normal in size. Pericardium: There is no evidence of pericardial effusion. Mitral Valve: The mitral valve is normal in structure. Trivial mitral valve regurgitation. No evidence of mitral valve stenosis. MV peak gradient, 4.2 mmHg. The mean mitral valve gradient is 2.0 mmHg. Tricuspid Valve: The tricuspid valve is normal in structure. Tricuspid valve regurgitation is trivial. No evidence of tricuspid stenosis. Aortic Valve: The aortic valve is tricuspid. Aortic valve regurgitation is not visualized. No aortic stenosis is present. Aortic valve mean gradient measures 3.0 mmHg. Aortic valve peak gradient measures 5.0 mmHg. Aortic valve area, by VTI measures 3.16 cm. Pulmonic Valve: The pulmonic valve was not well visualized. Pulmonic valve regurgitation is not visualized. No evidence of pulmonic stenosis. Aorta: The aortic root is normal in size and structure. Venous: The inferior vena cava is normal in size with greater than 50% respiratory variability, suggesting right atrial pressure of 3 mmHg. IAS/Shunts: No atrial level shunt detected by color flow Doppler.  LEFT VENTRICLE PLAX 2D LVIDd:         4.40 cm     Diastology  LVIDs:         3.10 cm     LV e' medial:    7.07 cm/s LV PW:         1.10 cm     LV E/e' medial:  12.3 LV IVS:        1.10 cm     LV e' lateral:   8.27 cm/s LVOT diam:     2.00 cm     LV E/e' lateral: 10.5 LV SV:         66 LV SV Index:   33 LVOT Area:     3.14 cm  LV Volumes (MOD) LV vol d, MOD A2C: 66.8 ml LV vol d, MOD A4C: 82.8 ml LV vol s, MOD A2C: 20.8 ml LV vol s, MOD A4C: 37.9 ml LV SV MOD A2C:     46.0 ml LV SV MOD A4C:     82.8 ml LV SV MOD BP:      46.8 ml RIGHT VENTRICLE RV Basal diam:  2.95 cm RV Mid diam:    2.60 cm RV S prime:     13.40 cm/s TAPSE (M-mode): 2.2 cm LEFT ATRIUM             Index        RIGHT ATRIUM           Index LA diam:        3.80 cm 1.88 cm/m   RA Area:     11.40 cm LA Vol (A2C):   43.6 ml 21.57 ml/m  RA Volume:   22.50 ml  11.13 ml/m LA Vol (A4C):   51.3 ml 25.38 ml/m LA Biplane Vol: 47.3 ml 23.40 ml/m  AORTIC VALVE                    PULMONIC VALVE AV Area (Vmax):    3.20 cm     PV Vmax:       0.74 m/s AV Area (Vmean):   2.80 cm     PV Peak grad:  2.2 mmHg AV Area (VTI):     3.16 cm AV Vmax:  112.00 cm/s AV Vmean:          79.800 cm/s AV VTI:            0.209 m AV Peak Grad:      5.0 mmHg AV Mean Grad:      3.0 mmHg LVOT Vmax:         114.00 cm/s LVOT Vmean:        71.200 cm/s LVOT VTI:          0.210 m LVOT/AV VTI ratio: 1.00  AORTA Ao Root diam: 3.10 cm MITRAL VALVE MV Area (PHT): 3.76 cm    SHUNTS MV Area VTI:   3.20 cm    Systemic VTI:  0.21 m MV Peak grad:  4.2 mmHg    Systemic Diam: 2.00 cm MV Mean grad:  2.0 mmHg MV Vmax:       1.02 m/s MV Vmean:      61.4 cm/s MV Decel Time: 202 msec MV E velocity: 87.00 cm/s MV A velocity: 88.30 cm/s MV E/A ratio:  0.99 Dina Rich MD Electronically signed by Dina Rich MD Signature Date/Time: 10/11/2021/4:37:25 PM    Final     Cardiac Studies   As above Cath pending.  Patient Profile     55 y.o. male past medical history of CAD (s/p DES to [place] in 2017 with medical management recommended of residual  disease, low-risk NST in 08/2019), HTN, HLD, Type 2 DM and GERD. Here with chest pain, plan for cath.   Assessment & Plan   Principal Problem:   Chest pain Active Problems:   GERD (gastroesophageal reflux disease)   Diabetes mellitus (HCC)   Hypertension   Anxiety disorder   HLD (hyperlipidemia)   Will plan for LHC today. Patient may need L radial access since he had a brachial artery injury to right arm in past.  - continue BB, ARB, ASA 81 mg daily, zetia (statin intolerant). - cont heparin IV.   INFORMED CONSENT: I have reviewed the risks, indications, and alternatives to cardiac catheterization, possible angioplasty, and stenting with the patient. Risks include but are not limited to bleeding, infection, vascular injury, stroke, myocardial infarction, arrhythmia, kidney injury, radiation-related injury in the case of prolonged fluoroscopy use, emergency cardiac surgery, and death. The patient understands the risks of serious complication is 1-2 in 1000 with diagnostic cardiac cath and 1-2% or less with angioplasty/stenting.        For questions or updates, please contact [facility] Please consult [email or web address] for contact info under        Signed, Parke Poisson, MD  10/12/2021, 10:54 AM

## 2021-10-12 NOTE — Progress Notes (Signed)
Progress Note  Patient Name: Shawn Hughes Date of Encounter: 10/12/2021  Primary Cardiologist: Nona Dell, MD   Subjective   Mild chest pain returning now, response to morphine. Requesting ativan.   Inpatient Medications    Scheduled Meds:  [START ON 10/13/2021] aspirin EC  81 mg Oral Daily   busPIRone  10 mg Oral Daily   insulin aspart  0-15 Units Subcutaneous TID WC   insulin aspart  0-5 Units Subcutaneous QHS   insulin detemir  45 Units Subcutaneous Daily   LORazepam  1 mg Intravenous Once   methylPREDNISolone sodium succinate  125 mg Intravenous Once   metoprolol succinate  12.5 mg Oral Daily   nitroGLYCERIN  0.4 mg Transdermal Daily   pantoprazole  40 mg Oral Daily   sodium chloride flush  3 mL Intravenous Q12H   Continuous Infusions:  sodium chloride 100 mL/hr at 10/12/21 0000   sodium chloride     sodium chloride 1 mL/kg/hr (10/12/21 0505)   heparin 1,400 Units/hr (10/12/21 1030)   PRN Meds: sodium chloride, acetaminophen, diphenhydrAMINE, morphine injection, nitroGLYCERIN, sodium chloride flush   Vital Signs    Vitals:   10/11/21 2106 10/11/21 2300 10/12/21 0000 10/12/21 0409  BP: 125/78 107/61 106/64 114/83  Pulse: 96 82 86 85  Resp: 18 13 14 15   Temp: 98.4 F (36.9 C) 97.6 F (36.4 C)  97.6 F (36.4 C)  TempSrc: Oral Oral  Oral  SpO2: 97% 96% 90% 98%  Weight: 89.8 kg     Height: 5\' 9"  (1.753 m)       Intake/Output Summary (Last 24 hours) at 10/12/2021 1054 Last data filed at 10/12/2021 0409 Gross per 24 hour  Intake 2809.94 ml  Output --  Net 2809.94 ml   Filed Weights   10/11/21 0900 10/11/21 2106  Weight: 86.2 kg 89.8 kg    Telemetry    SR - Personally Reviewed  ECG    Sinus tachycardia rate 118, nonspecific ST abnl - Personally Reviewed  Physical Exam   GEN: No acute distress.   Neck: No JVD Cardiac: regular rhythm, normal rate, no murmurs, rubs, or gallops.  Respiratory: Clear to auscultation bilaterally. GI:  Soft, nontender, non-distended  MS: No edema; No deformity. Neuro:  Nonfocal  Psych: Normal affect   Labs    Chemistry Recent Labs  Lab 10/11/21 0913  NA 135  K 4.0  CL 102  CO2 23  GLUCOSE 300*  BUN 12  CREATININE 0.81  CALCIUM 9.2  GFRNONAA >60  ANIONGAP 10     Hematology Recent Labs  Lab 10/11/21 0913  WBC 8.8  RBC 4.83  HGB 16.2  HCT 45.8  MCV 94.8  MCH 33.5  MCHC 35.4  RDW 12.6  PLT 344    Cardiac EnzymesNo results for input(s): TROPONINI in the last 168 hours. No results for input(s): TROPIPOC in the last 168 hours.   BNPNo results for input(s): BNP, PROBNP in the last 168 hours.   DDimer No results for input(s): DDIMER in the last 168 hours.   Radiology    DG Chest 2 View  Result Date: 10/11/2021 CLINICAL DATA:  Chest pain short of breath EXAM: CHEST - 2 VIEW COMPARISON:  09/20/2019 FINDINGS: The heart size and mediastinal contours are within normal limits. Left coronary stent noted. Both lungs are clear. The visualized skeletal structures are unremarkable. IMPRESSION: No active cardiopulmonary disease. Electronically Signed   By: Marlan Palau M.D.   On: 10/11/2021 09:39   CT Angio  Chest PE W/Cm &/Or Wo Cm  Result Date: 10/11/2021 CLINICAL DATA:  Chest pain EXAM: CT ANGIOGRAPHY CHEST WITH CONTRAST TECHNIQUE: Multidetector CT imaging of the chest was performed using the standard protocol during bolus administration of intravenous contrast. Multiplanar CT image reconstructions and MIPs were obtained to evaluate the vascular anatomy. CONTRAST:  56mL OMNIPAQUE IOHEXOL 350 MG/ML SOLN COMPARISON:  Chest radiograph performed on the same date FINDINGS: Cardiovascular: Satisfactory opacification of the pulmonary arteries to the segmental level. No evidence of pulmonary embolism. Normal heart size. Vascular stent in the circumflex artery no pericardial effusion. Mediastinum/Nodes: No enlarged mediastinal, hilar, or axillary lymph nodes. Thyroid gland, trachea, and  esophagus demonstrate no significant findings. Lungs/Pleura: Lungs are clear. No pleural effusion or pneumothorax. Upper Abdomen: No acute abnormality. Musculoskeletal: No chest wall abnormality. No acute or significant osseous findings. Review of the MIP images confirms the above findings. IMPRESSION: 1.  No evidence of pulmonary embolism. 2.  No acute cardiopulmonary process. Electronically Signed   By: Larose Hires D.O.   On: 10/11/2021 11:16   ECHOCARDIOGRAM COMPLETE  Result Date: 10/11/2021    ECHOCARDIOGRAM REPORT   Patient Name:   Shawn Hughes Date of Exam: 10/11/2021 Medical Rec #:  [id number]        Height:       69.0 in Accession #:    [id number]       Weight:       190.0 lb Date of Birth:  1966-09-04        BSA:          2.021 m Patient Age:    55 years         BP:           142/102 mmHg Patient Gender: M                HR:           97 bpm. [facility] Procedure: 2D Echo, Cardiac Doppler and Color Doppler Indications:    Chest Pain  History:        Patient has prior history of Echocardiogram examinations, most                 recent 09/21/2019. Previous Myocardial Infarction; Risk                 Factors:Current Smoker, Hypertension, Diabetes and Dyslipidemia.  Sonographer:    Mikki Harbor Referring Phys: [id number] Dorothe Pea BRANCH IMPRESSIONS  1. Left ventricular ejection fraction, by estimation, is 55 to 60%. The left ventricle has normal function. The left ventricle has no regional wall motion abnormalities. There is mild left ventricular hypertrophy. Left ventricular diastolic parameters were normal.  2. Right ventricular systolic function is normal. The right ventricular size is normal. Tricuspid regurgitation signal is inadequate for assessing PA pressure.  3. The mitral valve is normal in structure. Trivial mitral valve regurgitation. No evidence of mitral stenosis.  4. The aortic valve is tricuspid. Aortic valve regurgitation is not visualized. No aortic stenosis is  present.  5. The inferior vena cava is normal in size with greater than 50% respiratory variability, suggesting right atrial pressure of 3 mmHg. FINDINGS  Left Ventricle: Left ventricular ejection fraction, by estimation, is 55 to 60%. The left ventricle has normal function. The left ventricle has no regional wall motion abnormalities. The left ventricular internal cavity size was normal in size. There is  mild left ventricular hypertrophy. Left ventricular diastolic parameters were normal. Right  Ventricle: The right ventricular size is normal. No increase in right ventricular wall thickness. Right ventricular systolic function is normal. Tricuspid regurgitation signal is inadequate for assessing PA pressure. Left Atrium: Left atrial size was normal in size. Right Atrium: Right atrial size was normal in size. Pericardium: There is no evidence of pericardial effusion. Mitral Valve: The mitral valve is normal in structure. Trivial mitral valve regurgitation. No evidence of mitral valve stenosis. MV peak gradient, 4.2 mmHg. The mean mitral valve gradient is 2.0 mmHg. Tricuspid Valve: The tricuspid valve is normal in structure. Tricuspid valve regurgitation is trivial. No evidence of tricuspid stenosis. Aortic Valve: The aortic valve is tricuspid. Aortic valve regurgitation is not visualized. No aortic stenosis is present. Aortic valve mean gradient measures 3.0 mmHg. Aortic valve peak gradient measures 5.0 mmHg. Aortic valve area, by VTI measures 3.16 cm. Pulmonic Valve: The pulmonic valve was not well visualized. Pulmonic valve regurgitation is not visualized. No evidence of pulmonic stenosis. Aorta: The aortic root is normal in size and structure. Venous: The inferior vena cava is normal in size with greater than 50% respiratory variability, suggesting right atrial pressure of 3 mmHg. IAS/Shunts: No atrial level shunt detected by color flow Doppler.  LEFT VENTRICLE PLAX 2D LVIDd:         4.40 cm     Diastology  LVIDs:         3.10 cm     LV e' medial:    7.07 cm/s LV PW:         1.10 cm     LV E/e' medial:  12.3 LV IVS:        1.10 cm     LV e' lateral:   8.27 cm/s LVOT diam:     2.00 cm     LV E/e' lateral: 10.5 LV SV:         66 LV SV Index:   33 LVOT Area:     3.14 cm  LV Volumes (MOD) LV vol d, MOD A2C: 66.8 ml LV vol d, MOD A4C: 82.8 ml LV vol s, MOD A2C: 20.8 ml LV vol s, MOD A4C: 37.9 ml LV SV MOD A2C:     46.0 ml LV SV MOD A4C:     82.8 ml LV SV MOD BP:      46.8 ml RIGHT VENTRICLE RV Basal diam:  2.95 cm RV Mid diam:    2.60 cm RV S prime:     13.40 cm/s TAPSE (M-mode): 2.2 cm LEFT ATRIUM             Index        RIGHT ATRIUM           Index LA diam:        3.80 cm 1.88 cm/m   RA Area:     11.40 cm LA Vol (A2C):   43.6 ml 21.57 ml/m  RA Volume:   22.50 ml  11.13 ml/m LA Vol (A4C):   51.3 ml 25.38 ml/m LA Biplane Vol: 47.3 ml 23.40 ml/m  AORTIC VALVE                    PULMONIC VALVE AV Area (Vmax):    3.20 cm     PV Vmax:       0.74 m/s AV Area (Vmean):   2.80 cm     PV Peak grad:  2.2 mmHg AV Area (VTI):     3.16 cm AV Vmax:  112.00 cm/s AV Vmean:          79.800 cm/s AV VTI:            0.209 m AV Peak Grad:      5.0 mmHg AV Mean Grad:      3.0 mmHg LVOT Vmax:         114.00 cm/s LVOT Vmean:        71.200 cm/s LVOT VTI:          0.210 m LVOT/AV VTI ratio: 1.00  AORTA Ao Root diam: 3.10 cm MITRAL VALVE MV Area (PHT): 3.76 cm    SHUNTS MV Area VTI:   3.20 cm    Systemic VTI:  0.21 m MV Peak grad:  4.2 mmHg    Systemic Diam: 2.00 cm MV Mean grad:  2.0 mmHg MV Vmax:       1.02 m/s MV Vmean:      61.4 cm/s MV Decel Time: 202 msec MV E velocity: 87.00 cm/s MV A velocity: 88.30 cm/s MV E/A ratio:  0.99 Dina Rich MD Electronically signed by Dina Rich MD Signature Date/Time: 10/11/2021/4:37:25 PM    Final     Cardiac Studies   As above Cath pending.  Patient Profile     55 y.o. male past medical history of CAD (s/p DES to RI in 2017 with medical management recommended of residual  disease, low-risk NST in 08/2019), HTN, HLD, Type 2 DM and GERD. Here with chest pain, plan for cath.   Assessment & Plan   Principal Problem:   Chest pain Active Problems:   GERD (gastroesophageal reflux disease)   Diabetes mellitus (HCC)   Hypertension   Anxiety disorder   HLD (hyperlipidemia)   Will plan for LHC today. Patient may need L radial access since he had a brachial artery injury to right arm in past.  - continue BB, ARB, ASA 81 mg daily, zetia (statin intolerant). - cont heparin IV.   INFORMED CONSENT: I have reviewed the risks, indications, and alternatives to cardiac catheterization, possible angioplasty, and stenting with the patient. Risks include but are not limited to bleeding, infection, vascular injury, stroke, myocardial infarction, arrhythmia, kidney injury, radiation-related injury in the case of prolonged fluoroscopy use, emergency cardiac surgery, and death. The patient understands the risks of serious complication is 1-2 in 1000 with diagnostic cardiac cath and 1-2% or less with angioplasty/stenting.        For questions or updates, please contact [facility] Please consult [email or web address] for contact info under        Signed, Parke Poisson, MD  10/12/2021, 10:54 AM

## 2021-10-12 NOTE — Progress Notes (Signed)
Per Dr. Isidoro Donning patient okay to have clears for breakfast then make NPO.

## 2021-10-12 NOTE — Progress Notes (Signed)
   10/12/21 2000  Assess: MEWS Score  BP 107/67  Pulse Rate (!) 112  ECG Heart Rate (!) 113  Resp 12  Level of Consciousness Alert  SpO2 94 %  O2 Device Room Air  Assess: MEWS Score  MEWS Temp 0  MEWS Systolic 0  MEWS Pulse 2  MEWS RR 1  MEWS LOC 0  MEWS Score 3  MEWS Score Color Yellow  Assess: if the MEWS score is Yellow or Red  Were vital signs taken at a resting state? Yes  Focused Assessment No change from prior assessment  Early Detection of Sepsis Score *See Row Information* Low  MEWS guidelines implemented *See Row Information* Yes  Treat  MEWS Interventions Escalated (See documentation below)  Pain Scale 0-10  Pain Score 0  Take Vital Signs  Increase Vital Sign Frequency  Yellow: Q 2hr X 2 then Q 4hr X 2, if remains yellow, continue Q 4hrs  Escalate  MEWS: Escalate Yellow: discuss with charge nurse/RN and consider discussing with provider and RRT  Notify: Charge Nurse/RN  Name of Charge Nurse/RN Notified Jessica, RN  Date Charge Nurse/RN Notified 10/12/21  Time Charge Nurse/RN Notified 2000  Document  Patient Outcome Other (Comment) (stable and on the unit)  Progress note created (see row info) Yes

## 2021-10-12 NOTE — Interval H&P Note (Signed)
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  The various methods of treatment have been discussed with the patient and family. After consideration of risks, benefits and other options for treatment, the patient has consented to  Procedure(s): LEFT HEART CATH AND CORS/GRAFTS ANGIOGRAPHY (N/A)  PERCUTANEOUS CORONARY INTERVENTION  as a surgical intervention.  The patient's history has been reviewed, patient examined, no change in status, stable for surgery.  I have reviewed the patient's chart and labs.  Questions were answered to the patient's satisfaction.    Cath Lab Visit (complete for each Cath Lab visit)  Clinical Evaluation Leading to the Procedure:   ACS: Yes.  ;  Unstable angina  Non-ACS:    Anginal Classification: CCS III  Anti-ischemic medical therapy: Minimal Therapy (1 class of medications)  Non-Invasive Test Results: No non-invasive testing performed  Prior CABG: No previous CABG    Bryan Lemma

## 2021-10-12 NOTE — Progress Notes (Signed)
[facility]                                                                              Patient Demographics  Shawn Hughes, is a 55 y.o. male, DOB - 13-Nov-1966, BUL:[id number]  Admit date - 10/11/2021   Admitting Physician Montel Vanderhoof Jenna Luo, MD  Outpatient Primary MD for the patient is [facility], [facility]  Outpatient specialists:   LOS - 0  days   Medical records reviewed and are as summarized below:    Chief Complaint  Patient presents with   Chest Pain       Brief summary   Patient is a 55 year old male with history of CAD, DES in 2017, hypertension, hyperlipidemia, type 2 diabetes mellitus, IDDM, GERD presented to ED with chest pain.  Patient reported that he had intermittent chest pain episodes since Sunday in the last 5 days and would spontaneously resolve.  However since the day before admission, his chest pain became more intense, constant 10/10, sharp, midsternal and radiating to collarbones, worse with exertion, associated with nausea and shortness of breath.  No dizziness, lightheadedness or syncopal episode.  Chest pain was alleviated with sublingual nitro. Patient was admitted for further work-up, evaluated by cardiology at [facility], recommended transfer to [facility] for cardiac cath.   Assessment & Plan     Principal Problem: Atypical chest pain, angina equivalent in the setting of prior history of DES/CAD, high risk factors of hypertension, hyperlipidemia, uncontrolled diabetes mellitus  -Troponins x2 negative, EKG with no acute ST depression/elevation -Cardiology was consulted, started on IV heparin drip  -2D echo showed EF of 55 to 60%, no regional WMA, mild LVH. -Started on Protonix for GERD, also has strong anxiety combination, started on buspirone. -Cardiology following, plan for cardiac cath today   Active Problems:   GERD (gastroesophageal reflux disease) -Continue Protonix 40 mg daily     Diabetes mellitus (HCC), type II,  IDDM, uncontrolled with hyperglycemia -Patient has been taking metformin 1000 mg in the morning, typically does not eat breakfast, then takes Levemir 40 units after lunch and then takes another 40 units at supper if blood sugars are running high.  Per patient, his blood sugars has been running high during the day and has low CBGs around 3-3:30 am.  -Hemoglobin A1c 9.8 -Placed on Levemir 45 units daily, sliding scale insulin while inpatient -Recommended referral to endocrinology at [facility], fasting CBG 165 this a.m. Recent Labs    10/11/21 1502 10/11/21 2127 10/12/21 0614  GLUCAP 80 207* 165*        Hypertension -Continue beta-blocker, BP stable    Anxiety disorder -Symptoms improved with Ativan IV yesterday.  Today having anxiety/panic attack during my encounter and mild chest pain requested Ativan. -Started buspirone 10 mg daily, will need to follow-up with his PCP regarding anxiety.     HLD (hyperlipidemia) -He has been previously intolerant to Lipitor, Crestor, pravastatin, fatigue with Zetia.  Likely will need PCSK9 inhibitor therapy, will defer to cardiology     Code Status: Full CODE STATUS DVT Prophylaxis:    Lovenox   Level of Care: Level of care: Telemetry Cardiac Family Communication:  Discussed all imaging results, lab results, explained to the patient    Disposition Plan:     Status is: Observation  The patient remains OBS appropriate and will d/c before 2 midnights.  Pending cardiac cath today     Time Spent in minutes   25 minutes Procedures:  2D echo  Consultants:   Cardiology  Antimicrobials:   Anti-infectives (From admission, onward)    None          Medications  Scheduled Meds:  [START ON 10/13/2021] aspirin EC  81 mg Oral Daily   busPIRone  10 mg Oral Daily   diphenhydrAMINE  25 mg Intravenous Once   insulin aspart  0-15 Units Subcutaneous TID WC   insulin aspart  0-5 Units Subcutaneous QHS   insulin detemir  45 Units Subcutaneous  Daily   metoprolol succinate  12.5 mg Oral Daily   nitroGLYCERIN  0.4 mg Transdermal Daily   pantoprazole  40 mg Oral Daily   sodium chloride flush  3 mL Intravenous Q12H   Continuous Infusions:  sodium chloride 100 mL/hr at 10/12/21 0000   sodium chloride     sodium chloride 1 mL/kg/hr (10/12/21 0505)   heparin 1,400 Units/hr (10/12/21 1030)   PRN Meds:.sodium chloride, acetaminophen, diphenhydrAMINE, morphine injection, nitroGLYCERIN, sodium chloride flush      Subjective:   Shawn Hughes was seen and examined today.  Having anxiety issues during encounter with mild chest pain 2/10, feeling clammy.  States Ativan had helped in ED.  No nausea or vomiting, abdominal pain.   Objective:   Vitals:   10/11/21 2300 10/12/21 0000 10/12/21 0409 10/12/21 1108  BP: 107/61 106/64 114/83 133/88  Pulse: 82 86 85 82  Resp: 13 14 15    Temp: 97.6 F (36.4 C)  97.6 F (36.4 C)   TempSrc: Oral  Oral   SpO2: 96% 90% 98%   Weight:      Height:        Intake/Output Summary (Last 24 hours) at 10/12/2021 1113 Last data filed at 10/12/2021 0409 Gross per 24 hour  Intake 2809.94 ml  Output --  Net 2809.94 ml     Wt Readings from Last 3 Encounters:  10/11/21 89.8 kg  07/26/20 88 kg  01/13/20 87.5 kg    Physical Exam General: Alert and oriented x 3, somewhat anxious Cardiovascular: S1 S2 clear, RRR. No pedal edema b/l Respiratory: CTAB, no wheezing, rales or rhonchi Gastrointestinal: Soft, nontender, nondistended, NBS Ext: no pedal edema bilaterally Psych: somewhat anxious   Data Reviewed:  I have personally reviewed following labs and imaging studies  Micro Results Recent Results (from the past 240 hour(s))  Resp Panel by RT-PCR (Flu A&B, Covid) Nasopharyngeal Swab     Status: None   Collection Time: 10/11/21 12:44 PM   Specimen: Nasopharyngeal Swab; Nasopharyngeal(NP) swabs in vial transport medium  Result Value Ref Range Status   SARS Coronavirus 2 by RT PCR NEGATIVE  NEGATIVE Final    Comment: (NOTE) SARS-CoV-2 target nucleic acids are NOT DETECTED.  The SARS-CoV-2 RNA is generally detectable in upper respiratory specimens during the acute phase of infection. The lowest concentration of SARS-CoV-2 viral copies this assay can detect is 138 copies/mL. A negative result does not preclude SARS-Cov-2 infection and should not be used as the sole basis for treatment or other patient management decisions. A negative result may occur with  improper specimen collection/handling, submission of specimen other than nasopharyngeal swab, presence of viral mutation(s) within the areas targeted by this  assay, and inadequate number of viral copies(<138 copies/mL). A negative result must be combined with clinical observations, patient history, and epidemiological information. The expected result is Negative.  Fact Sheet for Patients:  BloggerCourse.com  Fact Sheet for Healthcare Providers:  SeriousBroker.it  This test is no t yet approved or cleared by the [place] FDA and  has been authorized for detection and/or diagnosis of SARS-CoV-2 by FDA under an Emergency Use Authorization (EUA). This EUA will remain  in effect (meaning this test can be used) for the duration of the COVID-19 declaration under Section 564(b)(1) of the Act, 21 U.S.C.section 360bbb-3(b)(1), unless the authorization is terminated  or revoked sooner.       Influenza A by PCR NEGATIVE NEGATIVE Final   Influenza B by PCR NEGATIVE NEGATIVE Final    Comment: (NOTE) The Xpert Xpress SARS-CoV-2/FLU/RSV plus assay is intended as an aid in the diagnosis of influenza from Nasopharyngeal swab specimens and should not be used as a sole basis for treatment. Nasal washings and aspirates are unacceptable for Xpert Xpress SARS-CoV-2/FLU/RSV testing.  Fact Sheet for Patients: BloggerCourse.com  Fact Sheet for Healthcare  Providers: SeriousBroker.it  This test is not yet approved or cleared by the [place] FDA and has been authorized for detection and/or diagnosis of SARS-CoV-2 by FDA under an Emergency Use Authorization (EUA). This EUA will remain in effect (meaning this test can be used) for the duration of the COVID-19 declaration under Section 564(b)(1) of the Act, 21 U.S.C. section 360bbb-3(b)(1), unless the authorization is terminated or revoked.  Performed at [facility], [address] [place], [place] [address]   MRSA Next Gen by PCR, Nasal     Status: None   Collection Time: 10/11/21  9:16 PM   Specimen: Nasal Mucosa; Nasal Swab  Result Value Ref Range Status   MRSA by PCR Next Gen NOT DETECTED NOT DETECTED Final    Comment: (NOTE) The GeneXpert MRSA Assay (FDA approved for NASAL specimens only), is one component of a comprehensive MRSA colonization surveillance program. It is not intended to diagnose MRSA infection nor to guide or monitor treatment for MRSA infections. Test performance is not FDA approved in patients less than 27 years old. Performed at [facility], [address] [place], [place] [address]     Radiology Reports DG Chest 2 View  Result Date: 10/11/2021 CLINICAL DATA:  Chest pain short of breath EXAM: CHEST - 2 VIEW COMPARISON:  09/20/2019 FINDINGS: The heart size and mediastinal contours are within normal limits. Left coronary stent noted. Both lungs are clear. The visualized skeletal structures are unremarkable. IMPRESSION: No active cardiopulmonary disease. Electronically Signed   By: Marlan Palau M.D.   On: 10/11/2021 09:39   CT Angio Chest PE W/Cm &/Or Wo Cm  Result Date: 10/11/2021 CLINICAL DATA:  Chest pain EXAM: CT ANGIOGRAPHY CHEST WITH CONTRAST TECHNIQUE: Multidetector CT imaging of the chest was performed using the standard protocol during bolus administration of intravenous contrast. Multiplanar CT image  reconstructions and MIPs were obtained to evaluate the vascular anatomy. CONTRAST:  40mL OMNIPAQUE IOHEXOL 350 MG/ML SOLN COMPARISON:  Chest radiograph performed on the same date FINDINGS: Cardiovascular: Satisfactory opacification of the pulmonary arteries to the segmental level. No evidence of pulmonary embolism. Normal heart size. Vascular stent in the circumflex artery no pericardial effusion. Mediastinum/Nodes: No enlarged mediastinal, hilar, or axillary lymph nodes. Thyroid gland, trachea, and esophagus demonstrate no significant findings. Lungs/Pleura: Lungs are clear. No pleural effusion or pneumothorax. Upper Abdomen: No  acute abnormality. Musculoskeletal: No chest wall abnormality. No acute or significant osseous findings. Review of the MIP images confirms the above findings. IMPRESSION: 1.  No evidence of pulmonary embolism. 2.  No acute cardiopulmonary process. Electronically Signed   By: Larose Hires D.O.   On: 10/11/2021 11:16   ECHOCARDIOGRAM COMPLETE  Result Date: 10/11/2021    ECHOCARDIOGRAM REPORT   Patient Name:   YANCY KNOBLE Date of Exam: 10/11/2021 Medical Rec #:  [id number]        Height:       69.0 in Accession #:    [id number]       Weight:       190.0 lb Date of Birth:  1966/07/21        BSA:          2.021 m Patient Age:    55 years         BP:           142/102 mmHg Patient Gender: M                HR:           97 bpm. [facility] Procedure: 2D Echo, Cardiac Doppler and Color Doppler Indications:    Chest Pain  History:        Patient has prior history of Echocardiogram examinations, most                 recent 09/21/2019. Previous Myocardial Infarction; Risk                 Factors:Current Smoker, Hypertension, Diabetes and Dyslipidemia.  Sonographer:    Mikki Harbor Referring Phys: [id number] Dorothe Pea BRANCH IMPRESSIONS  1. Left ventricular ejection fraction, by estimation, is 55 to 60%. The left ventricle has normal function. The left ventricle has no  regional wall motion abnormalities. There is mild left ventricular hypertrophy. Left ventricular diastolic parameters were normal.  2. Right ventricular systolic function is normal. The right ventricular size is normal. Tricuspid regurgitation signal is inadequate for assessing PA pressure.  3. The mitral valve is normal in structure. Trivial mitral valve regurgitation. No evidence of mitral stenosis.  4. The aortic valve is tricuspid. Aortic valve regurgitation is not visualized. No aortic stenosis is present.  5. The inferior vena cava is normal in size with greater than 50% respiratory variability, suggesting right atrial pressure of 3 mmHg. FINDINGS  Left Ventricle: Left ventricular ejection fraction, by estimation, is 55 to 60%. The left ventricle has normal function. The left ventricle has no regional wall motion abnormalities. The left ventricular internal cavity size was normal in size. There is  mild left ventricular hypertrophy. Left ventricular diastolic parameters were normal. Right Ventricle: The right ventricular size is normal. No increase in right ventricular wall thickness. Right ventricular systolic function is normal. Tricuspid regurgitation signal is inadequate for assessing PA pressure. Left Atrium: Left atrial size was normal in size. Right Atrium: Right atrial size was normal in size. Pericardium: There is no evidence of pericardial effusion. Mitral Valve: The mitral valve is normal in structure. Trivial mitral valve regurgitation. No evidence of mitral valve stenosis. MV peak gradient, 4.2 mmHg. The mean mitral valve gradient is 2.0 mmHg. Tricuspid Valve: The tricuspid valve is normal in structure. Tricuspid valve regurgitation is trivial. No evidence of tricuspid stenosis. Aortic Valve: The aortic valve is tricuspid. Aortic valve regurgitation is not visualized. No aortic stenosis is present. Aortic valve mean gradient measures 3.0  mmHg. Aortic valve peak gradient measures 5.0 mmHg. Aortic  valve area, by VTI measures 3.16 cm. Pulmonic Valve: The pulmonic valve was not well visualized. Pulmonic valve regurgitation is not visualized. No evidence of pulmonic stenosis. Aorta: The aortic root is normal in size and structure. Venous: The inferior vena cava is normal in size with greater than 50% respiratory variability, suggesting right atrial pressure of 3 mmHg. IAS/Shunts: No atrial level shunt detected by color flow Doppler.  LEFT VENTRICLE PLAX 2D LVIDd:         4.40 cm     Diastology LVIDs:         3.10 cm     LV e' medial:    7.07 cm/s LV PW:         1.10 cm     LV E/e' medial:  12.3 LV IVS:        1.10 cm     LV e' lateral:   8.27 cm/s LVOT diam:     2.00 cm     LV E/e' lateral: 10.5 LV SV:         66 LV SV Index:   33 LVOT Area:     3.14 cm  LV Volumes (MOD) LV vol d, MOD A2C: 66.8 ml LV vol d, MOD A4C: 82.8 ml LV vol s, MOD A2C: 20.8 ml LV vol s, MOD A4C: 37.9 ml LV SV MOD A2C:     46.0 ml LV SV MOD A4C:     82.8 ml LV SV MOD BP:      46.8 ml RIGHT VENTRICLE RV Basal diam:  2.95 cm RV Mid diam:    2.60 cm RV S prime:     13.40 cm/s TAPSE (M-mode): 2.2 cm LEFT ATRIUM             Index        RIGHT ATRIUM           Index LA diam:        3.80 cm 1.88 cm/m   RA Area:     11.40 cm LA Vol (A2C):   43.6 ml 21.57 ml/m  RA Volume:   22.50 ml  11.13 ml/m LA Vol (A4C):   51.3 ml 25.38 ml/m LA Biplane Vol: 47.3 ml 23.40 ml/m  AORTIC VALVE                    PULMONIC VALVE AV Area (Vmax):    3.20 cm     PV Vmax:       0.74 m/s AV Area (Vmean):   2.80 cm     PV Peak grad:  2.2 mmHg AV Area (VTI):     3.16 cm AV Vmax:           112.00 cm/s AV Vmean:          79.800 cm/s AV VTI:            0.209 m AV Peak Grad:      5.0 mmHg AV Mean Grad:      3.0 mmHg LVOT Vmax:         114.00 cm/s LVOT Vmean:        71.200 cm/s LVOT VTI:          0.210 m LVOT/AV VTI ratio: 1.00  AORTA Ao Root diam: 3.10 cm MITRAL VALVE MV Area (PHT): 3.76 cm    SHUNTS MV Area VTI:   3.20 cm    Systemic VTI:  0.21 m MV  Peak grad:   4.2 mmHg    Systemic Diam: 2.00 cm MV Mean grad:  2.0 mmHg MV Vmax:       1.02 m/s MV Vmean:      61.4 cm/s MV Decel Time: 202 msec MV E velocity: 87.00 cm/s MV A velocity: 88.30 cm/s MV E/A ratio:  0.99 Dina Rich MD Electronically signed by Dina Rich MD Signature Date/Time: 10/11/2021/4:37:25 PM    Final     Lab Data:  CBC: Recent Labs  Lab 10/11/21 0913  WBC 8.8  HGB 16.2  HCT 45.8  MCV 94.8  PLT 344   Basic Metabolic Panel: Recent Labs  Lab 10/11/21 0913  NA 135  K 4.0  CL 102  CO2 23  GLUCOSE 300*  BUN 12  CREATININE 0.81  CALCIUM 9.2   GFR: Estimated Creatinine Clearance: 116.8 mL/min (by C-G formula based on SCr of 0.81 mg/dL). Liver Function Tests: No results for input(s): AST, ALT, ALKPHOS, BILITOT, PROT, ALBUMIN in the last 168 hours. No results for input(s): LIPASE, AMYLASE in the last 168 hours. No results for input(s): AMMONIA in the last 168 hours. Coagulation Profile: No results for input(s): INR, PROTIME in the last 168 hours. Cardiac Enzymes: No results for input(s): CKTOTAL, CKMB, CKMBINDEX, TROPONINI in the last 168 hours. BNP (last 3 results) No results for input(s): PROBNP in the last 8760 hours. HbA1C: Recent Labs    10/11/21 0913  HGBA1C 9.8*   CBG: Recent Labs  Lab 10/11/21 1502 10/11/21 2127 10/12/21 0614  GLUCAP 80 207* 165*   Lipid Profile: No results for input(s): CHOL, HDL, LDLCALC, TRIG, CHOLHDL, LDLDIRECT in the last 72 hours. Thyroid Function Tests: No results for input(s): TSH, T4TOTAL, FREET4, T3FREE, THYROIDAB in the last 72 hours. Anemia Panel: No results for input(s): VITAMINB12, FOLATE, FERRITIN, TIBC, IRON, RETICCTPCT in the last 72 hours. Urine analysis:    Component Value Date/Time   COLORURINE YELLOW 12/14/2016 0837   APPEARANCEUR CLEAR 12/14/2016 0837   LABSPEC 1.010 12/14/2016 0837   PHURINE 5.5 12/14/2016 0837   GLUCOSEU >=500 (A) 12/14/2016 0837   HGBUR NEGATIVE 12/14/2016 0837   BILIRUBINUR  NEGATIVE 12/14/2016 0837   KETONESUR 15 (A) 12/14/2016 0837   PROTEINUR NEGATIVE 12/14/2016 0837   UROBILINOGEN 0.2 02/04/2015 2045   NITRITE NEGATIVE 12/14/2016 0837   LEUKOCYTESUR NEGATIVE 12/14/2016 0837     Helmuth Recupero M.D. [facility] 10/12/2021, 11:13 AM  Available via Epic secure chat 7am-7pm After 7 pm, please refer to night coverage provider listed on amion.

## 2021-10-13 DIAGNOSIS — E0800 Diabetes mellitus due to underlying condition with hyperosmolarity without nonketotic hyperglycemic-hyperosmolar coma (NKHHC): Secondary | ICD-10-CM | POA: Diagnosis not present

## 2021-10-13 DIAGNOSIS — I208 Other forms of angina pectoris: Secondary | ICD-10-CM | POA: Diagnosis not present

## 2021-10-13 DIAGNOSIS — R072 Precordial pain: Secondary | ICD-10-CM | POA: Diagnosis not present

## 2021-10-13 DIAGNOSIS — K219 Gastro-esophageal reflux disease without esophagitis: Secondary | ICD-10-CM | POA: Diagnosis not present

## 2021-10-13 DIAGNOSIS — F064 Anxiety disorder due to known physiological condition: Secondary | ICD-10-CM | POA: Diagnosis not present

## 2021-10-13 LAB — CBC
HCT: 37.6 % — ABNORMAL LOW (ref 39.0–52.0)
Hemoglobin: 13 g/dL (ref 13.0–17.0)
MCH: 32.7 pg (ref 26.0–34.0)
MCHC: 34.6 g/dL (ref 30.0–36.0)
MCV: 94.7 fL (ref 80.0–100.0)
Platelets: 284 10*3/uL (ref 150–400)
RBC: 3.97 MIL/uL — ABNORMAL LOW (ref 4.22–5.81)
RDW: 12.2 % (ref 11.5–15.5)
WBC: 14.1 10*3/uL — ABNORMAL HIGH (ref 4.0–10.5)
nRBC: 0 % (ref 0.0–0.2)

## 2021-10-13 LAB — GLUCOSE, CAPILLARY
Glucose-Capillary: 387 mg/dL — ABNORMAL HIGH (ref 70–99)
Glucose-Capillary: 242 mg/dL — ABNORMAL HIGH (ref 70–99)
Glucose-Capillary: 186 mg/dL — ABNORMAL HIGH (ref 70–99)

## 2021-10-13 LAB — TSH: TSH: 1.711 u[IU]/mL (ref 0.350–4.500)

## 2021-10-13 MED ORDER — METOPROLOL SUCCINATE ER 25 MG PO TB24: 25.0000 mg | ORAL_TABLET | Freq: Every day | ORAL | 3 refills | Status: DC

## 2021-10-13 MED ORDER — PANTOPRAZOLE SODIUM 40 MG PO TBEC: 40.0000 mg | DELAYED_RELEASE_TABLET | Freq: Every day | ORAL | 3 refills | Status: DC

## 2021-10-13 MED ORDER — INSULIN LISPRO (1 UNIT DIAL) 100 UNIT/ML (KWIKPEN): 5.0000 [IU] | PEN_INJECTOR | Freq: Three times a day (TID) | SUBCUTANEOUS | 3 refills | Status: DC

## 2021-10-13 MED ORDER — INSULIN ASPART 100 UNIT/ML IJ SOLN
4.0000 [IU] | Freq: Three times a day (TID) | INTRAMUSCULAR | Status: DC
  Administered 2021-10-13 (×2): 4 [IU] via SUBCUTANEOUS

## 2021-10-13 MED ORDER — ISOSORBIDE MONONITRATE ER 30 MG PO TB24: 30.0000 mg | ORAL_TABLET | Freq: Every day | ORAL | 3 refills | Status: DC

## 2021-10-13 MED ORDER — INSULIN DETEMIR 100 UNIT/ML ~~LOC~~ SOLN
35.0000 [IU] | Freq: Two times a day (BID) | SUBCUTANEOUS | Status: DC
  Administered 2021-10-13: 35 [IU] via SUBCUTANEOUS
  Filled 2021-10-13 (×2): qty 0.35

## 2021-10-13 MED ORDER — INSULIN LISPRO (1 UNIT DIAL) 100 UNIT/ML (KWIKPEN): 0.0000 [IU] | PEN_INJECTOR | Freq: Three times a day (TID) | SUBCUTANEOUS | 11 refills | Status: DC

## 2021-10-13 MED ORDER — BUSPIRONE HCL 10 MG PO TABS: 10.0000 mg | ORAL_TABLET | Freq: Every day | ORAL | 0 refills | Status: DC

## 2021-10-13 MED ORDER — "PEN NEEDLES 3/16"" 31G X 5 MM MISC": 5.0000 [IU] | Freq: Three times a day (TID) | 3 refills | Status: DC

## 2021-10-13 MED ORDER — INSULIN DETEMIR 100 UNIT/ML ~~LOC~~ SOLN: 35.0000 [IU] | Freq: Two times a day (BID) | SUBCUTANEOUS | 3 refills | Status: DC

## 2021-10-13 NOTE — Evaluation (Signed)
Physical Therapy Evaluation and Discharge Patient Details Name: Shawn Hughes MRN: [id number] DOB: 09-Oct-1966 Today's Date: 10/13/2021  History of Present Illness  Pt is a 55 y.o. M who presents 10/11/2021 with chest pain. 2D echo showed EF of 55-60%. EKG wtih no acute ST depression/elevation. S/p cath 11/18 "Likely culprit lesion is very small caliber nondominant RCA with proximal 90% stenosis.  Too small for PCI." Significant PMH: CAD, HTN, HLD, type 2 DM.  Clinical Impression  Patient evaluated by Physical Therapy with no further acute PT needs identified. PTA, pt lives with his family and works as a a Therapist, music. Pt denies chest pain, reports mild headache. Pt overall moving well, ambulating 500 feet with no assistive device independently (see below for vitals). Education provided regarding general exercise recommendations and intensity. All education has been completed and the patient has no further questions. No follow-up Physical Therapy or equipment needs. PT is signing off. Thank you for this referral.  Pre mobility: SpO2 97% on RA, HR 82 bpm, BP 114/81 (90) Post mobility: SpO2 98% on RA, HR 107 bpm, BP 123/85 (98)      Recommendations for follow up therapy are one component of a multi-disciplinary discharge planning process, led by the attending physician.  Recommendations may be updated based on patient status, additional functional criteria and insurance authorization.  Follow Up Recommendations No PT follow up    Assistance Recommended at Discharge None  Functional Status Assessment Patient has not had a recent decline in their functional status  Equipment Recommendations  None recommended by PT    Recommendations for Other Services       Precautions / Restrictions Precautions Precautions: None Restrictions Weight Bearing Restrictions: No      Mobility  Bed Mobility Overal bed mobility: Independent                  Transfers Overall transfer  level: Independent Equipment used: None                    Ambulation/Gait Ambulation/Gait assistance: Independent Gait Distance (Feet): 500 Feet Assistive device: None Gait Pattern/deviations: WFL(Within Functional Limits)          Stairs            Wheelchair Mobility    Modified Rankin (Stroke Patients Only)       Balance Overall balance assessment: No apparent balance deficits (not formally assessed)                                           Pertinent Vitals/Pain Pain Assessment: No/denies pain    Home Living Family/patient expects to be discharged to:: Private residence Living Arrangements: Spouse/significant other;Children Available Help at Discharge: Family Type of Home: House Home Access: Stairs to enter   Secretary/administrator of Steps: 4   Home Layout: One level        Prior Function Prior Level of Function : Independent/Modified Independent             Mobility Comments: department supervisor       Hand Dominance        Extremity/Trunk Assessment   Upper Extremity Assessment Upper Extremity Assessment: Overall WFL for tasks assessed    Lower Extremity Assessment Lower Extremity Assessment: Overall WFL for tasks assessed    Cervical / Trunk Assessment Cervical / Trunk Assessment: Normal  Communication   Communication:  No difficulties  Cognition Arousal/Alertness: Awake/alert Behavior During Therapy: WFL for tasks assessed/performed Overall Cognitive Status: Within Functional Limits for tasks assessed                                          General Comments      Exercises     Assessment/Plan    PT Assessment Patient does not need any further PT services  PT Problem List         PT Treatment Interventions      PT Goals (Current goals can be found in the Care Plan section)  Acute Rehab PT Goals Patient Stated Goal: go home PT Goal Formulation: All assessment and  education complete, DC therapy    Frequency     Barriers to discharge        Co-evaluation               AM-PAC PT "6 Clicks" Mobility  Outcome Measure Help needed turning from your back to your side while in a flat bed without using bedrails?: None Help needed moving from lying on your back to sitting on the side of a flat bed without using bedrails?: None Help needed moving to and from a bed to a chair (including a wheelchair)?: None Help needed standing up from a chair using your arms (e.g., wheelchair or bedside chair)?: None Help needed to walk in hospital room?: None Help needed climbing 3-5 steps with a railing? : None 6 Click Score: 24    End of Session   Activity Tolerance: Patient tolerated treatment well Patient left: in bed;with call bell/phone within reach;with family/visitor present Nurse Communication: Mobility status PT Visit Diagnosis: Pain Pain - part of body:  (chest)    Time: 5409-8119 PT Time Calculation (min) (ACUTE ONLY): 20 min   Charges:   PT Evaluation $PT Eval Low Complexity: 1 Low          Lillia Pauls, PT, DPT Acute Rehabilitation Services Pager [phone] Office [phone]   Norval Morton 10/13/2021, 11:34 AM

## 2021-10-13 NOTE — Progress Notes (Signed)
Progress Note  Patient Name: Shawn Hughes Date of Encounter: 10/13/2021  Primary Cardiologist: Nona Dell, MD   Subjective   Denies CP   Breathing is OK  NO dizzienss  " Im a first responder   My HR is always high"  Inpatient Medications    Scheduled Meds:  aspirin EC  81 mg Oral Daily   busPIRone  10 mg Oral Daily   insulin aspart  0-15 Units Subcutaneous TID WC   insulin aspart  0-5 Units Subcutaneous QHS   insulin aspart  4 Units Subcutaneous TID WC   insulin detemir  35 Units Subcutaneous BID   isosorbide mononitrate  30 mg Oral Daily   melatonin  5 mg Oral QHS   metoprolol succinate  25 mg Oral Daily   pantoprazole  40 mg Oral Daily   sodium chloride flush  3 mL Intravenous Q12H   sodium chloride flush  3 mL Intravenous Q12H   Continuous Infusions:  sodium chloride 100 mL/hr at 10/12/21 2339   sodium chloride     PRN Meds: sodium chloride, acetaminophen, diphenhydrAMINE, morphine injection, nitroGLYCERIN, ondansetron (ZOFRAN) IV, sodium chloride flush   Vital Signs    Vitals:   10/12/21 2200 10/12/21 2339 10/13/21 0000 10/13/21 0329  BP: 99/66 135/75 111/70 119/90  Pulse: (!) 110 (!) 109 (!) 108 (!) 110  Resp: 16 17 17 16   Temp: 97.7 F (36.5 C) 97.6 F (36.4 C)  98.2 F (36.8 C)  TempSrc: Oral Oral  Oral  SpO2: 92% 97%  97%  Weight:      Height:        Intake/Output Summary (Last 24 hours) at 10/13/2021 0656 Last data filed at 10/13/2021 4536 Gross per 24 hour  Intake 1341.39 ml  Output 150 ml  Net 1191.39 ml   Filed Weights   10/11/21 0900 10/11/21 2106  Weight: 86.2 kg 89.8 kg    Telemetry     ST  100s - Personally Reviewed  ECG     Physical Exam   GEN: No acute distress.   Neck: No JVD Cardiac: regular rhythm, normal rate, no murmurs,  Respiratory: Clear to auscultation bilaterally. GI: Soft, nontender, non-distended  MS: No edema; No deformity. Neuro:  Nonfocal  Psych: Normal affect   Labs    Chemistry Recent  Labs  Lab 10/11/21 0913  NA 135  K 4.0  CL 102  CO2 23  GLUCOSE 300*  BUN 12  CREATININE 0.81  CALCIUM 9.2  GFRNONAA >60  ANIONGAP 10     Hematology Recent Labs  Lab 10/11/21 0913 10/13/21 0047  WBC 8.8 14.1*  RBC 4.83 3.97*  HGB 16.2 13.0  HCT 45.8 37.6*  MCV 94.8 94.7  MCH 33.5 32.7  MCHC 35.4 34.6  RDW 12.6 12.2  PLT 344 284    Cardiac EnzymesNo results for input(s): TROPONINI in the last 168 hours. No results for input(s): TROPIPOC in the last 168 hours.   BNPNo results for input(s): BNP, PROBNP in the last 168 hours.   DDimer No results for input(s): DDIMER in the last 168 hours.   Radiology    DG Chest 2 View  Result Date: 10/11/2021 CLINICAL DATA:  Chest pain short of breath EXAM: CHEST - 2 VIEW COMPARISON:  09/20/2019 FINDINGS: The heart size and mediastinal contours are within normal limits. Left coronary stent noted. Both lungs are clear. The visualized skeletal structures are unremarkable. IMPRESSION: No active cardiopulmonary disease. Electronically Signed   By: Marlan Palau  M.D.   On: 10/11/2021 09:39   CT Angio Chest PE W/Cm &/Or Wo Cm  Result Date: 10/11/2021 CLINICAL DATA:  Chest pain EXAM: CT ANGIOGRAPHY CHEST WITH CONTRAST TECHNIQUE: Multidetector CT imaging of the chest was performed using the standard protocol during bolus administration of intravenous contrast. Multiplanar CT image reconstructions and MIPs were obtained to evaluate the vascular anatomy. CONTRAST:  75mL OMNIPAQUE IOHEXOL 350 MG/ML SOLN COMPARISON:  Chest radiograph performed on the same date FINDINGS: Cardiovascular: Satisfactory opacification of the pulmonary arteries to the segmental level. No evidence of pulmonary embolism. Normal heart size. Vascular stent in the circumflex artery no pericardial effusion. Mediastinum/Nodes: No enlarged mediastinal, hilar, or axillary lymph nodes. Thyroid gland, trachea, and esophagus demonstrate no significant findings. Lungs/Pleura: Lungs are  clear. No pleural effusion or pneumothorax. Upper Abdomen: No acute abnormality. Musculoskeletal: No chest wall abnormality. No acute or significant osseous findings. Review of the MIP images confirms the above findings. IMPRESSION: 1.  No evidence of pulmonary embolism. 2.  No acute cardiopulmonary process. Electronically Signed   By: Larose Hires D.O.   On: 10/11/2021 11:16   CARDIAC CATHETERIZATION  Result Date: 10/12/2021   Prox LAD to Mid LAD lesion is 20% stenosed with 20% stenosed side branch in 2nd Diag.   LPAV lesion is 55% stenosed with 55% stenosed side branch in 1st LPL. ->  RFR not significant (1.0)   1st Mrg stent is~ 60% stenosed.  RFR 0.91, 0.92 with FFR of 0.83.  Not significant   Very small caliber, nondominant prox RCA lesion is 90% stenosed.   LV end diastolic pressure is low.   There is no aortic valve stenosis. SUMMARY Likely culprit lesion is very small caliber nondominant RCA with proximal 90% stenosis.  Too small for PCI Moderate in-stent restenosis in OM1/RI -> RFR 0.91-0.92.  R0.83.  NOT SIGNIFICANT Moderate distal LCx 50 to 60% prior to propagation into LPL 1 and PDA -> RFR 1.  NOT SIGNIFICANT. With systolic blood pressures in the 90s to low 100s, LVEDP low. Bryan Lemma, MD  ECHOCARDIOGRAM COMPLETE  Result Date: 10/11/2021    ECHOCARDIOGRAM REPORT   Patient Name:   Shawn Hughes Date of Exam: 10/11/2021 Medical Rec #:  [id number]        Height:       69.0 in Accession #:    [id number]       Weight:       190.0 lb Date of Birth:  09/26/1966        BSA:          2.021 m Patient Age:    55 years         BP:           142/102 mmHg Patient Gender: M                HR:           97 bpm. [facility] Procedure: 2D Echo, Cardiac Doppler and Color Doppler Indications:    Chest Pain  History:        Patient has prior history of Echocardiogram examinations, most                 recent 09/21/2019. Previous Myocardial Infarction; Risk                 Factors:Current Smoker,  Hypertension, Diabetes and Dyslipidemia.  Sonographer:    Mikki Harbor Referring Phys: [id number] Dorothe Pea BRANCH IMPRESSIONS  1. Left  ventricular ejection fraction, by estimation, is 55 to 60%. The left ventricle has normal function. The left ventricle has no regional wall motion abnormalities. There is mild left ventricular hypertrophy. Left ventricular diastolic parameters were normal.  2. Right ventricular systolic function is normal. The right ventricular size is normal. Tricuspid regurgitation signal is inadequate for assessing PA pressure.  3. The mitral valve is normal in structure. Trivial mitral valve regurgitation. No evidence of mitral stenosis.  4. The aortic valve is tricuspid. Aortic valve regurgitation is not visualized. No aortic stenosis is present.  5. The inferior vena cava is normal in size with greater than 50% respiratory variability, suggesting right atrial pressure of 3 mmHg. FINDINGS  Left Ventricle: Left ventricular ejection fraction, by estimation, is 55 to 60%. The left ventricle has normal function. The left ventricle has no regional wall motion abnormalities. The left ventricular internal cavity size was normal in size. There is  mild left ventricular hypertrophy. Left ventricular diastolic parameters were normal. Right Ventricle: The right ventricular size is normal. No increase in right ventricular wall thickness. Right ventricular systolic function is normal. Tricuspid regurgitation signal is inadequate for assessing PA pressure. Left Atrium: Left atrial size was normal in size. Right Atrium: Right atrial size was normal in size. Pericardium: There is no evidence of pericardial effusion. Mitral Valve: The mitral valve is normal in structure. Trivial mitral valve regurgitation. No evidence of mitral valve stenosis. MV peak gradient, 4.2 mmHg. The mean mitral valve gradient is 2.0 mmHg. Tricuspid Valve: The tricuspid valve is normal in structure. Tricuspid valve regurgitation is  trivial. No evidence of tricuspid stenosis. Aortic Valve: The aortic valve is tricuspid. Aortic valve regurgitation is not visualized. No aortic stenosis is present. Aortic valve mean gradient measures 3.0 mmHg. Aortic valve peak gradient measures 5.0 mmHg. Aortic valve area, by VTI measures 3.16 cm. Pulmonic Valve: The pulmonic valve was not well visualized. Pulmonic valve regurgitation is not visualized. No evidence of pulmonic stenosis. Aorta: The aortic root is normal in size and structure. Venous: The inferior vena cava is normal in size with greater than 50% respiratory variability, suggesting right atrial pressure of 3 mmHg. IAS/Shunts: No atrial level shunt detected by color flow Doppler.  LEFT VENTRICLE PLAX 2D LVIDd:         4.40 cm     Diastology LVIDs:         3.10 cm     LV e' medial:    7.07 cm/s LV PW:         1.10 cm     LV E/e' medial:  12.3 LV IVS:        1.10 cm     LV e' lateral:   8.27 cm/s LVOT diam:     2.00 cm     LV E/e' lateral: 10.5 LV SV:         66 LV SV Index:   33 LVOT Area:     3.14 cm  LV Volumes (MOD) LV vol d, MOD A2C: 66.8 ml LV vol d, MOD A4C: 82.8 ml LV vol s, MOD A2C: 20.8 ml LV vol s, MOD A4C: 37.9 ml LV SV MOD A2C:     46.0 ml LV SV MOD A4C:     82.8 ml LV SV MOD BP:      46.8 ml RIGHT VENTRICLE RV Basal diam:  2.95 cm RV Mid diam:    2.60 cm RV S prime:     13.40 cm/s TAPSE (M-mode): 2.2 cm  LEFT ATRIUM             Index        RIGHT ATRIUM           Index LA diam:        3.80 cm 1.88 cm/m   RA Area:     11.40 cm LA Vol (A2C):   43.6 ml 21.57 ml/m  RA Volume:   22.50 ml  11.13 ml/m LA Vol (A4C):   51.3 ml 25.38 ml/m LA Biplane Vol: 47.3 ml 23.40 ml/m  AORTIC VALVE                    PULMONIC VALVE AV Area (Vmax):    3.20 cm     PV Vmax:       0.74 m/s AV Area (Vmean):   2.80 cm     PV Peak grad:  2.2 mmHg AV Area (VTI):     3.16 cm AV Vmax:           112.00 cm/s AV Vmean:          79.800 cm/s AV VTI:            0.209 m AV Peak Grad:      5.0 mmHg AV Mean Grad:       3.0 mmHg LVOT Vmax:         114.00 cm/s LVOT Vmean:        71.200 cm/s LVOT VTI:          0.210 m LVOT/AV VTI ratio: 1.00  AORTA Ao Root diam: 3.10 cm MITRAL VALVE MV Area (PHT): 3.76 cm    SHUNTS MV Area VTI:   3.20 cm    Systemic VTI:  0.21 m MV Peak grad:  4.2 mmHg    Systemic Diam: 2.00 cm MV Mean grad:  2.0 mmHg MV Vmax:       1.02 m/s MV Vmean:      61.4 cm/s MV Decel Time: 202 msec MV E velocity: 87.00 cm/s MV A velocity: 88.30 cm/s MV E/A ratio:  0.99 Dina Rich MD Electronically signed by Dina Rich MD Signature Date/Time: 10/11/2021/4:37:25 PM    Final     Cardiac Studies   LHC 10/12/21    Prox LAD to Mid LAD lesion is 20% stenosed with 20% stenosed side branch in 2nd Diag.   LPAV lesion is 55% stenosed with 55% stenosed side branch in 1st LPL. ->  RFR not significant (1.0)   1st Mrg stent is~ 60% stenosed.  RFR 0.91, 0.92 with FFR of 0.83.  Not significant   Very small caliber, nondominant prox RCA lesion is 90% stenosed.   LV end diastolic pressure is low.   There is no aortic valve stenosis.   SUMMARY Likely culprit lesion is very small caliber nondominant RCA with proximal 90% stenosis.  Too small for PCI Moderate in-stent restenosis in OM1/RI -> RFR 0.91-0.92.  R0.83.  NOT SIGNIFICANT Moderate distal LCx 50 to 60% prior to propagation into LPL 1 and PDA -> RFR 1.  NOT SIGNIFICANT. With systolic blood pressures in the 90s to low 100s, LVEDP low.  LVEDP is 6     Patient Profile     55 y.o. male past medical history of CAD (s/p DES to RI in 2017 with medical management recommended of residual disease, low-risk NST in 08/2019), HTN, HLD, Type 2 DM and GERD. Here with chest pain, plan for cath.   Assessment & Plan  1  CAD   Cath yesteday as noted above   Plan for medical therapy given small vessel size  Pt is comfortable   HR is a little high   His LVEDP was low and he received some hydration   Will give an additional 250 then cap   FOllow   Ambulate today Pt  insistent on going home later   2  HL   Intolerant to statins.  Fatigue on Zetia  Being considered for PCSK9 I  3  HTN     4  Sinus tachy   Give additional hydration   AMbulate  FOllow    Check TSH   Pt says his HR is always high  DM  Type II  Glu 300 earlier   A1C is 9.8   Will need follow up as outpt  Pt hs been seen in [place] office   Will need follow up after d/c in 2 wks   Needs to get risk factors under  control   For questions or updates, please contact [facility] Please consult [email or web address] for contact info under        Signed, Dietrich Pates, MD  10/13/2021, 6:56 AM

## 2021-10-13 NOTE — Progress Notes (Signed)
Per Dr. Charlott Rakes request, I have sent a message to our office's scheduling team requesting a follow-up appointment, and our office will call the patient with this information.

## 2021-10-13 NOTE — Discharge Summary (Signed)
Physician Discharge Summary   Patient ID: Shawn Hughes MRN: [id number] DOB/AGE: September 08, 1966 55 y.o.  Admit date: 10/11/2021 Discharge date: 10/13/2021  Primary Care Physician:  [facility]   Recommendations for Outpatient Follow-up:  Follow up with PCP in 1-2 weeks Please obtain BMP/CBC in one week  Patient started on short acting lispro 5 units 3 times daily before meals and sliding scale insulin, Levemir 35 units twice daily.  Recommended keeping a log of the blood sugars and follow-up with endocrinology, Dr. Fransico Him Toprol-XL increased to 25 mg daily Added Imdur 30 mg daily Patient has significant anxiety issues, added buspirone 10 mg daily,  will need outpatient follow-up   Home Health: Ambulating without any difficulty Equipment/Devices:   Discharge Condition: stable CODE STATUS: DNR   Diet recommendation:  Carb modified diet  Discharge Diagnoses:     Chest pain in the setting of known history of CAD Diabetes mellitus type 2, IDDM, uncontrolled with hyperglycemia Sinus tachycardia  GERD (gastroesophageal reflux disease)  Anxiety disorder  HLD (hyperlipidemia)  Hypertension   Consults: Cardiology    Allergies:   Allergies  Allergen Reactions   Insulin Glargine Anaphylaxis    Anaphylaxis    Lortab [Hydrocodone-Acetaminophen] Itching   Lipitor [Atorvastatin Calcium] Other (See Comments)    Extreme muscle weakness, passing out, generalized pain all through body   Hydrocodone Itching   Iodinated Diagnostic Agents Itching    Pt given contrast and began itching after injection.      DISCHARGE MEDICATIONS: Allergies as of 10/13/2021       Reactions   Insulin Glargine Anaphylaxis   Anaphylaxis    Lortab [hydrocodone-acetaminophen] Itching   Lipitor [atorvastatin Calcium] Other (See Comments)   Extreme muscle weakness, passing out, generalized pain all through body   Hydrocodone Itching   Iodinated Diagnostic Agents Itching   Pt given  contrast and began itching after injection.         Medication List     STOP taking these medications    metFORMIN 500 MG (MOD) 24 hr tablet Commonly known as: GLUMETZA       TAKE these medications    aspirin EC 81 MG tablet Take 1 tablet (81 mg total) by mouth daily with breakfast.   busPIRone 10 MG tablet Commonly known as: BUSPAR Take 1 tablet (10 mg total) by mouth daily. Start taking on: October 14, 2021   insulin detemir 100 UNIT/ML injection Commonly known as: LEVEMIR Inject 0.35 mLs (35 Units total) into the skin 2 (two) times daily. Please Give 90-day supply What changed:  how much to take when to take this   insulin lispro 100 UNIT/ML KwikPen Commonly known as: HumaLOG KwikPen Inject 5 Units into the skin 3 (three) times daily before meals.   insulin lispro 100 UNIT/ML KwikPen Commonly known as: HumaLOG KwikPen Inject 0-9 Units into the skin 3 (three) times daily. Correction factor sliding scale:  Sliding scale CBG 70 - 120: 0 units CBG 121 - 150: 1 unit,  CBG 151 - 200: 2 units,  CBG 201 - 250: 3 units,  CBG 251 - 300: 5 units,  CBG 301 - 350: 7 units,  CBG 351 - 400: 9 units   CBG > 400: 9 units and notify your MD   isosorbide mononitrate 30 MG 24 hr tablet Commonly known as: IMDUR Take 1 tablet (30 mg total) by mouth daily. Start taking on: October 14, 2021   metoprolol succinate 25 MG 24 hr tablet Commonly known as: Toprol  XL Take 1 tablet (25 mg total) by mouth daily. What changed: how much to take   nitroGLYCERIN 0.4 MG SL tablet Commonly known as: NITROSTAT Place 1 tablet (0.4 mg total) under the tongue every 5 (five) minutes as needed for chest pain.   pantoprazole 40 MG tablet Commonly known as: PROTONIX Take 1 tablet (40 mg total) by mouth daily. Start taking on: October 14, 2021   Pen Needles 3/16" 31G X 5 MM Misc 5 Units by Does not apply route 3 (three) times daily before meals.         Brief H and P: For complete details  please refer to admission H and P, but in brief Patient is a 55 year old male with history of CAD, DES in 2017, hypertension, hyperlipidemia, type 2 diabetes mellitus, IDDM, GERD presented to ED with chest pain.  Patient reported that he had intermittent chest pain episodes since Sunday in the last 5 days and would spontaneously resolve.  However since the day before admission, his chest pain became more intense, constant 10/10, sharp, midsternal and radiating to collarbones, worse with exertion, associated with nausea and shortness of breath.  No dizziness, lightheadedness or syncopal episode.  Chest pain was alleviated with sublingual nitro. Patient was admitted for further work-up, evaluated by cardiology at [facility], recommended transfer to [facility] for cardiac cath.  Hospital Course:   Atypical chest pain, angina equivalent in the setting of prior history of DES/CAD, high risk factors of hypertension, hyperlipidemia, uncontrolled diabetes mellitus  -Troponins x2 negative, EKG with no acute ST depression/elevation -Cardiology was consulted, started on IV heparin drip  -2D echo showed EF of 55 to 60%, no regional WMA, mild LVH. -Started on Protonix for GERD, also has strong anxiety combination, started on buspirone. -Underwent cardiac cath on 11/18 showed likely culprit lesion is very small caliber nondominant RCA with proximal 90% stenosis, too small for PCI.  Medical management pending. -Currently no chest pain or shortness of breath.  Started on Imdur, increased beta-blocker to 25 mg daily.  Patient insistent on going home today.  He ambulated without any difficulty.  Cleared by cardiology  Sinus tachycardia -Patient was noted to have sinus tachycardia not much worse with exertion.  Beta-blocker was increased to 25 mg daily.  TSH normal. Has significant anxiety issues, started on buspirone 10 mg daily     GERD (gastroesophageal reflux disease) -Continue Protonix 40 mg daily     Diabetes  mellitus (HCC), type II, IDDM, uncontrolled with hyperglycemia -Patient has been taking metformin 1000 mg in the morning, typically does not eat breakfast, then takes Levemir 40 units after lunch and then takes another 40 units at supper if blood sugars are running high.  Per patient, his blood sugars has been running high during the day and has low CBGs around 3-3:30 am.  -Hemoglobin A1c 9.8 -He is open to using meal coverage insulin.  Diabetic coordinator was consulted.  Metformin discontinued.  Patient will continue Levemir 25 units twice daily, lispro 5 units 3 times daily AC, sliding scale insulin Strongly recommended to follow-up with endocrinology, Dr. Fransico Him      Hypertension -Continue beta-blocker, BP stable     Anxiety disorder -Patient has significant anxiety issues during hospitalization, started on buspirone 10 mg daily.   - will need to follow-up with his PCP regarding anxiety.     HLD (hyperlipidemia) -He has been previously intolerant to Lipitor, Crestor, pravastatin, fatigue with Zetia.  Likely will need PCSK9 inhibitor therapy, will defer to  cardiology    Day of Discharge S: Wants to go home, wife at the bedside.  BP 125/86 (BP Location: Right Arm)   Pulse 99   Temp 97.6 F (36.4 C) (Oral)   Resp 12   Ht  (1.753 m)   Wt 89.8 kg   SpO2 97%   BMI 29.24 kg/m   Physical Exam: General: Alert and awake oriented x3 not in any acute distress. CVS: S1-S2 clear no murmur rubs or gallops Chest: clear to auscultation bilaterally, no wheezing rales or rhonchi Abdomen: soft nontender, nondistended, normal bowel sounds Extremities: no cyanosis, clubbing or edema noted bilaterally Neuro: Cranial nerves II-XII intact, no focal neurological deficits    Get Medicines reviewed and adjusted: Please take all your medications with you for your next visit with your Primary MD  Please request your Primary MD to go over all hospital tests and procedure/radiological results at  the follow up. Please ask your Primary MD to get all Hospital records sent to his/her office.  If you experience worsening of your admission symptoms, develop shortness of breath, life threatening emergency, suicidal or homicidal thoughts you must seek medical attention immediately by calling 911 or calling your MD immediately  if symptoms less severe.  You must read complete instructions/literature along with all the possible adverse reactions/side effects for all the Medicines you take and that have been prescribed to you. Take any new Medicines after you have completely understood and accept all the possible adverse reactions/side effects.   Do not drive when taking pain medications.   Do not take more than prescribed Pain, Sleep and Anxiety Medications  Special Instructions: If you have smoked or chewed Tobacco  in the last 2 yrs please stop smoking, stop any regular Alcohol  and or any Recreational drug use.  Wear Seat belts while driving.  Please note  You were cared for by a hospitalist during your hospital stay. Once you are discharged, your primary care physician will handle any further medical issues. Please note that NO REFILLS for any discharge medications will be authorized once you are discharged, as it is imperative that you return to your primary care physician (or establish a relationship with a primary care physician if you do not have one) for your aftercare needs so that they can reassess your need for medications and monitor your lab values.   The results of significant diagnostics from this hospitalization (including imaging, microbiology, ancillary and laboratory) are listed below for reference.      Procedures/Studies:  DG Chest 2 View  Result Date: 10/11/2021 CLINICAL DATA:  Chest pain short of breath EXAM: CHEST - 2 VIEW COMPARISON:  09/20/2019 FINDINGS: The heart size and mediastinal contours are within normal limits. Left coronary stent noted. Both lungs are  clear. The visualized skeletal structures are unremarkable. IMPRESSION: No active cardiopulmonary disease. Electronically Signed   By: Marlan Palau M.D.   On: 10/11/2021 09:39   CT Angio Chest PE W/Cm &/Or Wo Cm  Result Date: 10/11/2021 CLINICAL DATA:  Chest pain EXAM: CT ANGIOGRAPHY CHEST WITH CONTRAST TECHNIQUE: Multidetector CT imaging of the chest was performed using the standard protocol during bolus administration of intravenous contrast. Multiplanar CT image reconstructions and MIPs were obtained to evaluate the vascular anatomy. CONTRAST:  75mL OMNIPAQUE IOHEXOL 350 MG/ML SOLN COMPARISON:  Chest radiograph performed on the same date FINDINGS: Cardiovascular: Satisfactory opacification of the pulmonary arteries to the segmental level. No evidence of pulmonary embolism. Normal heart size. Vascular stent in the  circumflex artery no pericardial effusion. Mediastinum/Nodes: No enlarged mediastinal, hilar, or axillary lymph nodes. Thyroid gland, trachea, and esophagus demonstrate no significant findings. Lungs/Pleura: Lungs are clear. No pleural effusion or pneumothorax. Upper Abdomen: No acute abnormality. Musculoskeletal: No chest wall abnormality. No acute or significant osseous findings. Review of the MIP images confirms the above findings. IMPRESSION: 1.  No evidence of pulmonary embolism. 2.  No acute cardiopulmonary process. Electronically Signed   By: Larose Hires D.O.   On: 10/11/2021 11:16   CARDIAC CATHETERIZATION  Result Date: 10/12/2021   Prox LAD to Mid LAD lesion is 20% stenosed with 20% stenosed side branch in 2nd Diag.   LPAV lesion is 55% stenosed with 55% stenosed side branch in 1st LPL. ->  RFR not significant (1.0)   1st Mrg stent is~ 60% stenosed.  RFR 0.91, 0.92 with FFR of 0.83.  Not significant   Very small caliber, nondominant prox RCA lesion is 90% stenosed.   LV end diastolic pressure is low.   There is no aortic valve stenosis. SUMMARY Likely culprit lesion is very small  caliber nondominant RCA with proximal 90% stenosis.  Too small for PCI Moderate in-stent restenosis in OM1/RI -> RFR 0.91-0.92.  R0.83.  NOT SIGNIFICANT Moderate distal LCx 50 to 60% prior to propagation into LPL 1 and PDA -> RFR 1.  NOT SIGNIFICANT. With systolic blood pressures in the 90s to low 100s, LVEDP low. Bryan Lemma, MD  ECHOCARDIOGRAM COMPLETE  Result Date: 10/11/2021    ECHOCARDIOGRAM REPORT   Patient Name:   Shawn Hughes Date of Exam: 10/11/2021 Medical Rec #:  [id number]        Height:       69.0 in Accession #:    [id number]       Weight:       190.0 lb Date of Birth:  29-Feb-1966        BSA:          2.021 m Patient Age:    55 years         BP:           142/102 mmHg Patient Gender: M                HR:           97 bpm. [facility] Procedure: 2D Echo, Cardiac Doppler and Color Doppler Indications:    Chest Pain  History:        Patient has prior history of Echocardiogram examinations, most                 recent 09/21/2019. Previous Myocardial Infarction; Risk                 Factors:Current Smoker, Hypertension, Diabetes and Dyslipidemia.  Sonographer:    Mikki Harbor Referring Phys: [id number] Dorothe Pea BRANCH IMPRESSIONS  1. Left ventricular ejection fraction, by estimation, is 55 to 60%. The left ventricle has normal function. The left ventricle has no regional wall motion abnormalities. There is mild left ventricular hypertrophy. Left ventricular diastolic parameters were normal.  2. Right ventricular systolic function is normal. The right ventricular size is normal. Tricuspid regurgitation signal is inadequate for assessing PA pressure.  3. The mitral valve is normal in structure. Trivial mitral valve regurgitation. No evidence of mitral stenosis.  4. The aortic valve is tricuspid. Aortic valve regurgitation is not visualized. No aortic stenosis is present.  5. The inferior vena cava is normal in size  with greater than 50% respiratory variability, suggesting right  atrial pressure of 3 mmHg. FINDINGS  Left Ventricle: Left ventricular ejection fraction, by estimation, is 55 to 60%. The left ventricle has normal function. The left ventricle has no regional wall motion abnormalities. The left ventricular internal cavity size was normal in size. There is  mild left ventricular hypertrophy. Left ventricular diastolic parameters were normal. Right Ventricle: The right ventricular size is normal. No increase in right ventricular wall thickness. Right ventricular systolic function is normal. Tricuspid regurgitation signal is inadequate for assessing PA pressure. Left Atrium: Left atrial size was normal in size. Right Atrium: Right atrial size was normal in size. Pericardium: There is no evidence of pericardial effusion. Mitral Valve: The mitral valve is normal in structure. Trivial mitral valve regurgitation. No evidence of mitral valve stenosis. MV peak gradient, 4.2 mmHg. The mean mitral valve gradient is 2.0 mmHg. Tricuspid Valve: The tricuspid valve is normal in structure. Tricuspid valve regurgitation is trivial. No evidence of tricuspid stenosis. Aortic Valve: The aortic valve is tricuspid. Aortic valve regurgitation is not visualized. No aortic stenosis is present. Aortic valve mean gradient measures 3.0 mmHg. Aortic valve peak gradient measures 5.0 mmHg. Aortic valve area, by VTI measures 3.16 cm. Pulmonic Valve: The pulmonic valve was not well visualized. Pulmonic valve regurgitation is not visualized. No evidence of pulmonic stenosis. Aorta: The aortic root is normal in size and structure. Venous: The inferior vena cava is normal in size with greater than 50% respiratory variability, suggesting right atrial pressure of 3 mmHg. IAS/Shunts: No atrial level shunt detected by color flow Doppler.  LEFT VENTRICLE PLAX 2D LVIDd:         4.40 cm     Diastology LVIDs:         3.10 cm     LV e' medial:    7.07 cm/s LV PW:         1.10 cm     LV E/e' medial:  12.3 LV IVS:        1.10  cm     LV e' lateral:   8.27 cm/s LVOT diam:     2.00 cm     LV E/e' lateral: 10.5 LV SV:         66 LV SV Index:   33 LVOT Area:     3.14 cm  LV Volumes (MOD) LV vol d, MOD A2C: 66.8 ml LV vol d, MOD A4C: 82.8 ml LV vol s, MOD A2C: 20.8 ml LV vol s, MOD A4C: 37.9 ml LV SV MOD A2C:     46.0 ml LV SV MOD A4C:     82.8 ml LV SV MOD BP:      46.8 ml RIGHT VENTRICLE RV Basal diam:  2.95 cm RV Mid diam:    2.60 cm RV S prime:     13.40 cm/s TAPSE (M-mode): 2.2 cm LEFT ATRIUM             Index        RIGHT ATRIUM           Index LA diam:        3.80 cm 1.88 cm/m   RA Area:     11.40 cm LA Vol (A2C):   43.6 ml 21.57 ml/m  RA Volume:   22.50 ml  11.13 ml/m LA Vol (A4C):   51.3 ml 25.38 ml/m LA Biplane Vol: 47.3 ml 23.40 ml/m  AORTIC VALVE  PULMONIC VALVE AV Area (Vmax):    3.20 cm     PV Vmax:       0.74 m/s AV Area (Vmean):   2.80 cm     PV Peak grad:  2.2 mmHg AV Area (VTI):     3.16 cm AV Vmax:           112.00 cm/s AV Vmean:          79.800 cm/s AV VTI:            0.209 m AV Peak Grad:      5.0 mmHg AV Mean Grad:      3.0 mmHg LVOT Vmax:         114.00 cm/s LVOT Vmean:        71.200 cm/s LVOT VTI:          0.210 m LVOT/AV VTI ratio: 1.00  AORTA Ao Root diam: 3.10 cm MITRAL VALVE MV Area (PHT): 3.76 cm    SHUNTS MV Area VTI:   3.20 cm    Systemic VTI:  0.21 m MV Peak grad:  4.2 mmHg    Systemic Diam: 2.00 cm MV Mean grad:  2.0 mmHg MV Vmax:       1.02 m/s MV Vmean:      61.4 cm/s MV Decel Time: 202 msec MV E velocity: 87.00 cm/s MV A velocity: 88.30 cm/s MV E/A ratio:  0.99 Dina Rich MD Electronically signed by Dina Rich MD Signature Date/Time: 10/11/2021/4:37:25 PM    Final       LAB RESULTS: Basic Metabolic Panel: Recent Labs  Lab 10/11/21 0913  NA 135  K 4.0  CL 102  CO2 23  GLUCOSE 300*  BUN 12  CREATININE 0.81  CALCIUM 9.2   Liver Function Tests: No results for input(s): AST, ALT, ALKPHOS, BILITOT, PROT, ALBUMIN in the last 168 hours. No results for  input(s): LIPASE, AMYLASE in the last 168 hours. No results for input(s): AMMONIA in the last 168 hours. CBC: Recent Labs  Lab 10/11/21 0913 10/13/21 0047  WBC 8.8 14.1*  HGB 16.2 13.0  HCT 45.8 37.6*  MCV 94.8 94.7  PLT 344 284   Cardiac Enzymes: No results for input(s): CKTOTAL, CKMB, CKMBINDEX, TROPONINI in the last 168 hours. BNP: Invalid input(s): POCBNP CBG: Recent Labs  Lab 10/13/21 0606 10/13/21 1140  GLUCAP 242* 186*       Disposition and Follow-up: Discharge Instructions     Diet Carb Modified   Complete by: As directed    Discharge instructions   Complete by: As directed    It is VERY IMPORTANT that you follow up with a PCP on a regular basis.  Check your blood glucoses before each meal and at bedtime and maintain a log of your readings.  Bring this log with you when you follow up with your PCP so that he or she can adjust your insulin at your follow up visit.   Increase activity slowly   Complete by: As directed         DISPOSITION: Home   DISCHARGE FOLLOW-UP  Follow-up Information     Roma Kayser, MD. Schedule an appointment as soon as possible for a visit in 2 week(s).   Specialty: Endocrinology Why: for hospital follow-up Contact information: [address] [place] [address] [phone]         [facility]. Schedule an appointment as soon as possible for a visit in 2 week(s).   Contact information: [address] [place] [address] [phone]         Jonelle Sidle, MD. Schedule an appointment as soon as possible for a visit in 2 week(s).   Specialty: Cardiology Why: The cardiology office will call you to arrange follow-up. Contact information: [address] [place] [address] [phone]                  Time coordinating discharge:    Signed:   Thad Ranger M.D. [facility] 10/13/2021, 1:46 PM

## 2021-10-15 ENCOUNTER — Encounter (HOSPITAL_COMMUNITY): Payer: Self-pay | Admitting: Cardiology

## 2021-11-01 ENCOUNTER — Other Ambulatory Visit: Payer: Self-pay

## 2021-11-01 ENCOUNTER — Ambulatory Visit (INDEPENDENT_AMBULATORY_CARE_PROVIDER_SITE_OTHER): Payer: Commercial Managed Care - PPO | Admitting: "Endocrinology

## 2021-11-01 ENCOUNTER — Encounter: Payer: Self-pay | Admitting: "Endocrinology

## 2021-11-01 VITALS — BP 136/94 | HR 96 | Ht 69.0 in | Wt 192.4 lb

## 2021-11-01 DIAGNOSIS — E1159 Type 2 diabetes mellitus with other circulatory complications: Secondary | ICD-10-CM

## 2021-11-01 DIAGNOSIS — I10 Essential (primary) hypertension: Secondary | ICD-10-CM | POA: Diagnosis not present

## 2021-11-01 DIAGNOSIS — E782 Mixed hyperlipidemia: Secondary | ICD-10-CM | POA: Diagnosis not present

## 2021-11-01 DIAGNOSIS — Z7189 Other specified counseling: Secondary | ICD-10-CM

## 2021-11-01 DIAGNOSIS — Z6828 Body mass index (BMI) 28.0-28.9, adult: Secondary | ICD-10-CM

## 2021-11-01 DIAGNOSIS — Z789 Other specified health status: Secondary | ICD-10-CM | POA: Diagnosis not present

## 2021-11-01 MED ORDER — INSULIN LISPRO (1 UNIT DIAL) 100 UNIT/ML (KWIKPEN)
8.0000 [IU] | PEN_INJECTOR | Freq: Three times a day (TID) | SUBCUTANEOUS | 3 refills | Status: DC
Start: 2021-11-01 — End: 2023-07-02

## 2021-11-01 MED ORDER — INSULIN DETEMIR 100 UNIT/ML FLEXPEN: 40.0000 [IU] | PEN_INJECTOR | Freq: Every day | SUBCUTANEOUS | 3 refills | Status: DC

## 2021-11-01 MED ORDER — FREESTYLE LIBRE 2 SENSOR MISC: 1.0000 | 2 refills | Status: DC

## 2021-11-01 MED ORDER — INSULIN DETEMIR 100 UNIT/ML ~~LOC~~ SOLN: 40.0000 [IU] | Freq: Every day | SUBCUTANEOUS | 2 refills | Status: DC

## 2021-11-01 MED ORDER — INSULIN LISPRO (1 UNIT DIAL) 100 UNIT/ML (KWIKPEN)
8.0000 [IU] | PEN_INJECTOR | Freq: Three times a day (TID) | SUBCUTANEOUS | 3 refills | Status: DC
Start: 2021-11-01 — End: 2021-11-01

## 2021-11-01 NOTE — Progress Notes (Signed)
Endocrinology Consult Note       11/01/2021, 12:46 PM   Subjective:    Patient ID: Shawn Hughes, male    DOB: [birth year].  Shawn Hughes is being seen in consultation for management of currently uncontrolled symptomatic diabetes requested by  Bonnita Hollow, MD.   Past Medical History:  Diagnosis Date   CAD (coronary artery disease)    a. 08/2016: cath showing 40% prox RCA, 10% LCx, 20% LAD and 99% RI stenosis. DES to RI.   COVID-14 October 2019 - was not hospitalized   Essential hypertension    GERD (gastroesophageal reflux disease)    History of nephrolithiasis    Mixed hyperlipidemia    Type 2 diabetes mellitus ([place])     Past Surgical History:  Procedure Laterality Date   CARDIAC CATHETERIZATION N/A 08/30/2016   Procedure: Left Heart Cath and Coronary Angiography;  Surgeon: Burnell Blanks, MD;  Location: [facility];  Service: Cardiovascular;  Laterality: N/A;   CARDIAC CATHETERIZATION N/A 08/30/2016   Procedure: Coronary Stent Intervention;  Surgeon: Burnell Blanks, MD;  Location: [facility];  Service: Cardiovascular; 90% prox RI - > DES PCI Promus Premier 2.5 x 16 (2.75 mm)   ESOPHAGOGASTRODUODENOSCOPY  01/10/2012   [id number] in the distal esophagus2O TO GERD/Mild gastritis   INTRAVASCULAR PRESSURE WIRE/FFR STUDY N/A 10/12/2021   Procedure: INTRAVASCULAR PRESSURE WIRE/FFR STUDY;  Surgeon: Leonie Man, MD;  Location: [facility];  Service: Cardiovascular;  Laterality: N/A;   LACERATION REPAIR     Right arm   LEFT HEART CATH AND CORS/GRAFTS ANGIOGRAPHY N/A 10/12/2021   Procedure: LEFT HEART CATH AND CORS/GRAFTS ANGIOGRAPHY;  Surgeon: Leonie Man, MD;  Location: [facility];  Service: Cardiovascular;  Laterality: N/A;    Social History   Socioeconomic History   Marital status: Married    Spouse name: Not on file    Number of children: Not on file   Years of education: Not on file   Highest education level: Not on file  Occupational History   Not on file  Tobacco Use   Smoking status: Former    Packs/day: 0.00    Years: 15.00    Pack years: 0.00    Types: Cigarettes    Start date: 09/11/1983    Quit date: 08/30/2016    Years since quitting: 5.1   Smokeless tobacco: Never  Vaping Use   Vaping Use: Never used  Substance and Sexual Activity   Alcohol use: Yes    Comment: 16-32oz daily   Drug use: No   Sexual activity: Yes  Other Topics Concern   Not on file  Social History Narrative   Not on file   Social Determinants of Health   Financial Resource Strain: Not on file  Food Insecurity: Not on file  Transportation Needs: Not on file  Physical Activity: Not on file  Stress: Not on file  Social Connections: Not on file    Family History  Problem Relation Age of Onset   Hypertension Mother    CAD Mother 30   Heart failure  Mother    Heart failure Father    Hypertension Father    Diabetes Father    CAD Father 24   Hyperlipidemia Father    Hypertension Brother    Colon cancer Neg Hx    Colon polyps Neg Hx    Esophageal cancer Neg Hx    Pancreatic cancer Neg Hx    Stomach cancer Neg Hx     Outpatient Encounter Medications as of 11/01/2021  Medication Sig   Continuous Blood Gluc Sensor (FREESTYLE LIBRE 2 SENSOR) MISC 1 Piece by Does not apply route every 14 (fourteen) days.   aspirin EC 81 MG tablet Take 1 tablet (81 mg total) by mouth daily with breakfast.   busPIRone (BUSPAR) 10 MG tablet Take 1 tablet (10 mg total) by mouth daily.   insulin detemir (LEVEMIR) 100 UNIT/ML injection Inject 0.4 mLs (40 Units total) into the skin at bedtime.   Insulin Pen Needle (PEN NEEDLES 3/16") 31G X 5 MM MISC 5 Units by Does not apply route 3 (three) times daily before meals.   isosorbide mononitrate (IMDUR) 30 MG 24 hr tablet Take 1 tablet (30 mg total) by mouth daily.   metoprolol succinate  (TOPROL XL) 25 MG 24 hr tablet Take 1 tablet (25 mg total) by mouth daily.   nitroGLYCERIN (NITROSTAT) 0.4 MG SL tablet Place 1 tablet (0.4 mg total) under the tongue every 5 (five) minutes as needed for chest pain.   [DISCONTINUED] insulin detemir (LEVEMIR) 100 UNIT/ML injection Inject 0.35 mLs (35 Units total) into the skin 2 (two) times daily. Please Give 90-day supply   [DISCONTINUED] insulin lispro (HUMALOG KWIKPEN) 100 UNIT/ML KwikPen Inject 5 Units into the skin 3 (three) times daily before meals.   [DISCONTINUED] insulin lispro (HUMALOG KWIKPEN) 100 UNIT/ML KwikPen Inject 0-9 Units into the skin 3 (three) times daily. Correction factor sliding scale:  Sliding scale CBG 70 - 120: 0 units CBG 121 - 150: 1 unit,  CBG 151 - 200: 2 units,  CBG 201 - 250: 3 units,  CBG 251 - 300: 5 units,  CBG 301 - 350: 7 units,  CBG 351 - 400: 9 units   CBG > 400: 9 units and notify your MD   [DISCONTINUED] insulin lispro (HUMALOG KWIKPEN) 100 UNIT/ML KwikPen Inject 8-14 Units into the skin 3 (three) times daily before meals.   [DISCONTINUED] pantoprazole (PROTONIX) 40 MG tablet Take 1 tablet (40 mg total) by mouth daily.   No facility-administered encounter medications on file as of 11/01/2021.    ALLERGIES: Allergies  Allergen Reactions   Insulin Glargine Anaphylaxis    Anaphylaxis    Lortab [Hydrocodone-Acetaminophen] Itching   Lipitor [Atorvastatin Calcium] Other (See Comments)    Extreme muscle weakness, passing out, generalized pain all through body   Hydrocodone Itching   Iodinated Diagnostic Agents Itching    Pt given contrast and began itching after injection.     VACCINATION STATUS: There is no immunization history for the selected administration types on file for this patient.  Diabetes He presents for his initial diabetic visit. He has type 2 diabetes mellitus. Onset time: He was diagnosed at approximate age of 55 years. His disease course has been worsening. There are no hypoglycemic  associated symptoms. Pertinent negatives for hypoglycemia include no confusion, headaches, pallor or seizures. Associated symptoms include blurred vision, polydipsia and polyuria. Pertinent negatives for diabetes include no chest pain, no fatigue, no polyphagia and no weakness. There are no hypoglycemic complications. Symptoms are worsening. Diabetic complications include  heart disease. Risk factors for coronary artery disease include dyslipidemia, diabetes mellitus, hypertension, male sex and tobacco exposure. Current diabetic treatment includes insulin injections (He is currently on Levemir 35 units twice daily, Humalog 5 units 3 times daily.). He is following a generally unhealthy diet. When asked about meal planning, he reported none. He has not had a previous visit with a dietitian. He participates in exercise intermittently. His overall blood glucose range is >200 mg/dl. (He did not bring any logs nor meter with him.  He reports that his blood glucose readings are mostly above 200 mg per DL.  His recent A1c was 9.8% when he was hospitalized for chest pain.  He does have a history of coronary artery disease status post stent placement in 2017.) An ACE inhibitor/angiotensin II receptor blocker is not being taken.  Hyperlipidemia This is a chronic problem. The current episode started more than 1 year ago. The problem is uncontrolled. Exacerbating diseases include diabetes. Pertinent negatives include no chest pain, myalgias or shortness of breath. He is currently on no antihyperlipidemic treatment (He does have history of statin intolerance.). Risk factors for coronary artery disease include dyslipidemia, diabetes mellitus, family history, hypertension and male sex.  Hypertension This is a chronic problem. The current episode started more than 1 year ago. Associated symptoms include blurred vision. Pertinent negatives include no chest pain, headaches, neck pain, palpitations or shortness of breath. Risk  factors for coronary artery disease include dyslipidemia, diabetes mellitus, male gender and smoking/tobacco exposure. Past treatments include direct vasodilators and beta blockers. Hypertensive end-organ damage includes CAD/MI.    Review of Systems  Constitutional:  Negative for chills, fatigue, fever and unexpected weight change.  HENT:  Negative for dental problem, mouth sores and trouble swallowing.   Eyes:  Positive for blurred vision. Negative for visual disturbance.  Respiratory:  Negative for cough, choking, chest tightness, shortness of breath and wheezing.   Cardiovascular:  Negative for chest pain, palpitations and leg swelling.  Gastrointestinal:  Negative for abdominal distention, abdominal pain, constipation, diarrhea, nausea and vomiting.  Endocrine: Positive for polydipsia and polyuria. Negative for polyphagia.  Genitourinary:  Negative for dysuria, flank pain, hematuria and urgency.  Musculoskeletal:  Negative for back pain, gait problem, myalgias and neck pain.  Skin:  Negative for pallor, rash and wound.  Neurological:  Negative for seizures, syncope, weakness, numbness and headaches.  Psychiatric/Behavioral:  Negative for confusion and dysphoric mood.    Objective:    Vitals with BMI 11/01/2021 10/13/2021 10/13/2021  Height 5\' 9"  - -  Weight 192 lbs 6 oz - -  BMI [id number] - -  Systolic [id number]  Diastolic 94 86 72  Pulse 96 99 115    BP (!) 136/94   Pulse 96   Ht 5\' 9"  (1.753 m)   Wt 192 lb 6.4 oz (87.3 kg)   BMI 28.41 kg/m   Wt Readings from Last 3 Encounters:  11/01/21 192 lb 6.4 oz (87.3 kg)  10/11/21 197 lb 15.6 oz (89.8 kg)  07/26/20 194 lb (88 kg)     Physical Exam Constitutional:      General: He is not in acute distress.    Appearance: He is well-developed.  HENT:     Head: Normocephalic and atraumatic.  Neck:     Thyroid: No thyromegaly.     Trachea: No tracheal deviation.  Cardiovascular:     Rate and Rhythm: Normal rate.     Pulses:  Dorsalis pedis pulses are 1+ on the right side and 1+ on the left side.       Posterior tibial pulses are 1+ on the right side and 1+ on the left side.     Heart sounds: S1 normal and S2 normal. No murmur heard.   No gallop.  Pulmonary:     Effort: Pulmonary effort is normal. No respiratory distress.     Breath sounds: No wheezing.  Abdominal:     General: There is no distension.     Tenderness: There is no abdominal tenderness. There is no guarding.  Musculoskeletal:     Right shoulder: No swelling or deformity.     Cervical back: Normal range of motion and neck supple.  Skin:    General: Skin is warm and dry.     Findings: No rash.     Nails: There is no clubbing.  Neurological:     Mental Status: He is alert and oriented to person, place, and time.     Cranial Nerves: No cranial nerve deficit.     Sensory: No sensory deficit.     Gait: Gait normal.     Deep Tendon Reflexes: Reflexes are normal and symmetric.  Psychiatric:        Speech: Speech normal.        Behavior: Behavior is cooperative.        Thought Content: Thought content normal.     Comments: Passive, reluctant affect.  He displays a resistant behavior.      CMP ( most recent) CMP     Component Value Date/Time   NA 135 10/11/2021 0913   K 4.0 10/11/2021 0913   CL 102 10/11/2021 0913   CO2 23 10/11/2021 0913   GLUCOSE 300 (H) 10/11/2021 0913   BUN 12 10/11/2021 0913   CREATININE 0.81 10/11/2021 0913   CALCIUM 9.2 10/11/2021 0913   PROT 6.8 12/14/2016 0843   ALBUMIN 3.7 12/14/2016 0843   AST 20 12/14/2016 0843   ALT 27 12/14/2016 0843   ALKPHOS 91 12/14/2016 0843   BILITOT 0.6 12/14/2016 0843   GFRNONAA >60 10/11/2021 0913   GFRAA >60 09/21/2019 0555     Diabetic Labs (most recent): Lab Results  Component Value Date   HGBA1C 9.8 (H) 10/11/2021   HGBA1C 13.9 (H) 12/14/2016     Lipid Panel ( most recent) Lipid Panel     Component Value Date/Time   CHOL 232 (H) 09/21/2019 0555   TRIG  251 (H) 09/21/2019 0555   HDL 29 (L) 09/21/2019 0555   CHOLHDL 8.0 09/21/2019 0555   VLDL 50 (H) 09/21/2019 0555   LDLCALC 153 (H) 09/21/2019 0555      Lab Results  Component Value Date   TSH 1.711 10/12/2021   TSH 1.925 09/21/2019      Assessment & Plan:   1. DM type 2 causing vascular disease ([facility])   - Shawn Hughes has currently uncontrolled symptomatic type 2 DM since  55 years of age,  with most recent A1c of 9.8 %. Recent labs reviewed. He did have A1c as high as 13.9%. He did not bring any logs nor meter with him.  He reports that his blood glucose readings are mostly above 200 mg per DL.  His recent A1c was 9.8% when he was hospitalized for chest pain.  He does have a history of coronary artery disease status post stent placement in 2017.  - I had a long discussion with him about the progressive  nature of diabetes and the pathology behind its complications. -his diabetes is complicated by coronary artery disease which needed stent placement in 2017, chronic, severe hyperglycemic burden, comorbid hyperlipidemia with statin intolerance, hypertension and he remains at extremely high risk for more acute and chronic complications which include CAD, CVA, CKD, retinopathy, and neuropathy. These are all discussed in detail with him.  - I discussed all available options of managing his diabetes including de-escalation of medications. I have counseled him on diet  and weight management  by adopting a Whole Food , Plant Predominant  ( WFPP) nutrition as recommended by [employer] of Lifestyle Medicine. Patient is encouraged to switch to  unprocessed or minimally processed  complex starch, adequate protein intake (mainly plant source), minimal liquid fat ( mainly vegetable oils), plenty of fruits, and vegetables. -  he is advised to stick to a routine mealtimes to eat 3 complete meals a day and snack only when necessary ( to snack only to correct hypoglycemia BG <70 day time or <100  at night).   - he acknowledges that there is a room for improvement in his food and drink choices. - Further Specific Suggestion is made for him to avoid simple carbohydrates  from his diet including Cakes, Sweet Desserts, Ice Cream, Soda (diet and regular), Sweet Tea, Candies, Chips, Cookies, Store Bought Juices, Alcohol in Excess of  1-2 drinks a day, Artificial Sweeteners,  Coffee Creamer, and "Sugar-free" Products. This will help patient to have more stable blood glucose profile and potentially avoid unintended weight gain.  - he will be scheduled with Jearld Fenton, RDN, CDE for individualized diabetes education.  - I have approached him with the following plan to manage  his diabetes and patient agrees:   -In light of his presentation with severe, persistent hyperglycemia, he will be continued on intensive treatment with basal/bolus insulin.   -However, this patient will benefit significantly from lifestyle medicine.  Had a long discussion about Whole Foods, plant-based lifestyle nutrition, adequate exercise, adequate sleep, stress management, however, patient seems to be reluctant at this time.    -Accordingly, I have readjusted his Levemir at 40 units nightly, increase his Humalog to 8  units 3 times a day with meals  for pre-meal BG readings of 90-150mg /dl, plus patient specific correction dose for unexpected hyperglycemia above 150mg /dl, associated with strict monitoring of glucose 4 times a day-before meals and at bedtime. - he is warned not to take insulin without proper monitoring per orders. - Adjustment parameters are given to him for hypo and hyperglycemia in writing. - he is encouraged to call clinic for blood glucose levels less than 70 or above 200 mg /dl. -Patient will benefit from a CGM.  I discussed and prescribed the freestyle libre device for him. -He will be considered for other additional options as appropriate on subsequent visits. - Specific targets for  A1c;  LDL, HDL,   and Triglycerides were discussed with the patient.  2) Blood Pressure /Hypertension:  his blood pressure is not controlled to target.   he is advised to continue his current medications including metoprolol 25 mg p.o. once a day, isosorbide 30 mg p.o. twice daily.  He reports intolerance to lisinopril.   3) Lipids/Hyperlipidemia:   Review of his recent lipid panel showed uncontrolled  LDL at 153 .  He significant side effects from statins.  The severe dyslipidemia puts him at additional risk of coronary disease.  This makes it even more important for him to adopt plant-based  nutrition discussed above.  This patient needs to be reapproached for the option.  His wife is with him agrees with the recommendation and will attempt to convince him to adopt as well.     4)  Weight/Diet:  Body mass index is 28.41 kg/m.  -   clearly complicating his diabetes care.   he is  a candidate for weight loss. I discussed with him the fact that loss of 5 - 10% of his  current body weight will have the most impact on his diabetes management.  The above detailed WFPP Nutrition will help manage weight as well. Optimal Exercise, Restorative Sleep  information was detailed on discharge instructions.  5) Chronic Care/Health Maintenance:  -he  is not on ACEI/ARB and Statin medications and  is encouraged to initiate and continue to follow up with Ophthalmology, Dentist,  Podiatrist at least yearly or according to recommendations, and advised to   stay away from smoking. I have recommended yearly flu vaccine and pneumonia vaccine at least every 5 years; moderate intensity exercise for up to 150 minutes weekly; and  sleep for 7- 9 hours a day.  - he is  advised to maintain close follow up with Bonnita Hollow, MD for primary care needs, as well as his other providers for optimal and coordinated care.   I spent 82 minutes in the care of the patient today including review of labs from [facility], Lipids, Thyroid Function, Hematology  (current and previous including abstractions from other facilities); face-to-face time discussing  his blood glucose readings/logs, discussing hypoglycemia and hyperglycemia episodes and symptoms, medications doses, his options of short and long term treatment based on the latest standards of care / guidelines;  discussion about incorporating lifestyle medicine;  and documenting the encounter.     Please refer to Patient Instructions for Blood Glucose Monitoring and Insulin/Medications Dosing Guide"  in media tab for additional information. Please  also refer to " Patient Self Inventory" in the Media  tab for reviewed elements of pertinent patient history.  Shawn Hughes participated in the discussions, expressed understanding, and voiced agreement with the above plans.  All questions were answered to his satisfaction. he is encouraged to contact clinic should he have any questions or concerns prior to his return visit.   Follow up plan: - Return in about 2 weeks (around 11/15/2021) for F/U with Meter and Logs Only - no Labs.  Glade Lloyd, MD [facility] [address] [place], [place] [address] Phone: [phone]  Fax: [phone]    11/01/2021, 12:46 PM  This note was partially dictated with voice recognition software. Similar sounding words can be transcribed inadequately or may not  be corrected upon review.

## 2021-11-01 NOTE — Patient Instructions (Signed)

## 2021-11-08 ENCOUNTER — Ambulatory Visit: Payer: Commercial Managed Care - PPO | Admitting: Cardiology

## 2021-12-30 ENCOUNTER — Other Ambulatory Visit: Payer: Self-pay | Admitting: "Endocrinology

## 2021-12-31 ENCOUNTER — Telehealth: Payer: Self-pay

## 2021-12-31 NOTE — Telephone Encounter (Signed)
Called the patient and left a detailed voice message to let the [facility] know that [facility] [place] [facility] message that the Levemir is on back order. I left a detailed voice message for the patient to have the pharmacy check [facility] or he can check with different pharmacies and let us know where to send his refill.   Also patient needs to schedule a follow up appointment.

## 2022-01-21 ENCOUNTER — Other Ambulatory Visit: Payer: Self-pay | Admitting: "Endocrinology

## 2022-02-02 ENCOUNTER — Other Ambulatory Visit: Payer: Self-pay | Admitting: "Endocrinology

## 2022-02-06 ENCOUNTER — Other Ambulatory Visit: Payer: Self-pay | Admitting: "Endocrinology

## 2022-02-26 ENCOUNTER — Telehealth: Payer: Self-pay | Admitting: "Endocrinology

## 2022-02-26 ENCOUNTER — Ambulatory Visit: Payer: Commercial Managed Care - PPO | Admitting: "Endocrinology

## 2022-02-26 NOTE — Telephone Encounter (Signed)
Patient came in for his appt which was a 2 week follow up with meter and logs from December. Patient did not have meter or logs and did not want to reschedule.  ?

## 2022-03-07 ENCOUNTER — Telehealth: Payer: Self-pay | Admitting: Cardiology

## 2022-03-07 MED ORDER — METOPROLOL SUCCINATE ER 25 MG PO TB24: 25.0000 mg | ORAL_TABLET | Freq: Every day | ORAL | 0 refills | Status: DC

## 2022-03-07 NOTE — Telephone Encounter (Signed)
?*  STAT* If patient is at the pharmacy, call can be transferred to refill team. ? ? ?1. Which medications need to be refilled? (please list name of each medication and dose if known) new prescription for Metoprolol ? ?2. Which pharmacy/location (including street and city if local pharmacy) is medication to be sent to? [facility] [place] ? ?3. Do they need a 30 day or 90 day supply? 30 days and refills ? ?

## 2022-03-07 NOTE — Telephone Encounter (Signed)
Refill complete 

## 2022-03-21 ENCOUNTER — Encounter: Payer: Self-pay | Admitting: Cardiology

## 2022-03-21 ENCOUNTER — Ambulatory Visit (INDEPENDENT_AMBULATORY_CARE_PROVIDER_SITE_OTHER): Payer: Self-pay | Admitting: Cardiology

## 2022-03-21 VITALS — BP 140/88 | HR 90 | Ht 69.0 in | Wt 200.0 lb

## 2022-03-21 DIAGNOSIS — I10 Essential (primary) hypertension: Secondary | ICD-10-CM | POA: Insufficient documentation

## 2022-03-21 DIAGNOSIS — Z789 Other specified health status: Secondary | ICD-10-CM

## 2022-03-21 DIAGNOSIS — R000 Tachycardia, unspecified: Secondary | ICD-10-CM

## 2022-03-21 DIAGNOSIS — Z79899 Other long term (current) drug therapy: Secondary | ICD-10-CM

## 2022-03-21 DIAGNOSIS — I25119 Atherosclerotic heart disease of native coronary artery with unspecified angina pectoris: Secondary | ICD-10-CM

## 2022-03-21 DIAGNOSIS — E119 Type 2 diabetes mellitus without complications: Secondary | ICD-10-CM

## 2022-03-21 DIAGNOSIS — E782 Mixed hyperlipidemia: Secondary | ICD-10-CM

## 2022-03-21 DIAGNOSIS — I208 Other forms of angina pectoris: Secondary | ICD-10-CM

## 2022-03-21 MED ORDER — IRBESARTAN 150 MG PO TABS: 150.0000 mg | ORAL_TABLET | Freq: Every day | ORAL | 3 refills | Status: DC

## 2022-03-21 MED ORDER — METOPROLOL SUCCINATE ER 50 MG PO TB24: 50.0000 mg | ORAL_TABLET | Freq: Every day | ORAL | 3 refills | Status: DC

## 2022-03-21 NOTE — Assessment & Plan Note (Signed)
We will start Repatha, PCSK9 inhibitor given his diabetes known coronary artery disease prior MI.  LDL goal less than 55. ?

## 2022-03-21 NOTE — Assessment & Plan Note (Signed)
Increasing Toprol to 50 mg. ?

## 2022-03-21 NOTE — Assessment & Plan Note (Signed)
Unable to tolerate statins including Zetia ?

## 2022-03-21 NOTE — Assessment & Plan Note (Signed)
Blood pressure elevated.  We will add irbesartan 150 mg once a day.  Angiotensin receptor blocker.  Should be renal protective as well given his diabetes.  Repeat basic metabolic profile in 2 weeks.  Continue to work with primary team for better glucose control.  This has been a challenge for him. ?

## 2022-03-21 NOTE — Assessment & Plan Note (Signed)
Continue with isosorbide 30 mg.  We are increasing Toprol to 50. ?

## 2022-03-21 NOTE — Progress Notes (Signed)
?Cardiology Office Note:   ? ?Date:  03/21/2022  ? ?ID:  Shawn Hughes, DOB Nov 18, 1966, MRN [id number] ? ?PCP:  Garnette Gunner, MD ?  ?[facility] Providers ?Cardiologist:  Nona Dell, MD    ? ?Referring MD: [facility]  ? ? ?History of Present Illness:   ? ?Shawn Hughes is a 56 y.o. male here for follow-up of CAD, cardiac catheterization on 10/12/2021 showed small caliber nondominant RCA with 90% stenosis too small for PCI.  Medical management.  Had had some sinus tachycardia where beta-blocker was increased to 25 mg a day and TSH was unremarkable.  His prior hemoglobin A1c was 9.8.  He had been intolerant to several different statins including Lipitor Crestor pravastatin and fatigue with Zetia.  PCSK9 inhibitor was discussed previously. ? ?He has been dealing with higher than normal blood pressures.  He is not currently on any cholesterol medication.  He cannot tolerate statins he states.  He has been having trouble controlling his glucose has been in the 300 range.  Had some burning in his left neck left shoulder area this morning.  Also feels tachycardic ? ?Past Medical History:  ?Diagnosis Date  ? CAD (coronary artery disease)   ? a. 08/2016: cath showing 40% prox RCA, 10% LCx, 20% LAD and 99% RI stenosis. DES to RI.  ? COVID-19   ? November 2020 - was not hospitalized  ? Essential hypertension   ? GERD (gastroesophageal reflux disease)   ? History of nephrolithiasis   ? Mixed hyperlipidemia   ? Type 2 diabetes mellitus (HCC)   ? ? ?Past Surgical History:  ?Procedure Laterality Date  ? CARDIAC CATHETERIZATION N/A 08/30/2016  ? Procedure: Left Heart Cath and Coronary Angiography;  Surgeon: Kathleene Hazel, MD;  Location: [facility] INVASIVE CV LAB;  Service: Cardiovascular;  Laterality: N/A;  ? CARDIAC CATHETERIZATION N/A 08/30/2016  ? Procedure: Coronary Stent Intervention;  Surgeon: Kathleene Hazel, MD;  Location: [facility] INVASIVE CV LAB;  Service: Cardiovascular; 90% prox RI - >  DES PCI Promus Premier 2.5 x 16 (2.75 mm)  ? ESOPHAGOGASTRODUODENOSCOPY  01/10/2012  ? [id number] in the distal esophagus2O TO GERD/Mild gastritis  ? INTRAVASCULAR PRESSURE WIRE/FFR STUDY N/A 10/12/2021  ? Procedure: INTRAVASCULAR PRESSURE WIRE/FFR STUDY;  Surgeon: Marykay Lex, MD;  Location: [facility] INVASIVE CV LAB;  Service: Cardiovascular;  Laterality: N/A;  ? LACERATION REPAIR    ? Right arm  ? LEFT HEART CATH AND CORS/GRAFTS ANGIOGRAPHY N/A 10/12/2021  ? Procedure: LEFT HEART CATH AND CORS/GRAFTS ANGIOGRAPHY;  Surgeon: Marykay Lex, MD;  Location: [facility] INVASIVE CV LAB;  Service: Cardiovascular;  Laterality: N/A;  ? ? ?Current Medications: ?Current Meds  ?Medication Sig  ? aspirin EC 81 MG tablet Take 1 tablet (81 mg total) by mouth daily with breakfast.  ? Continuous Blood Gluc Sensor (FREESTYLE LIBRE 2 SENSOR) MISC 1 Piece by Does not apply route every 14 (fourteen) days.  ? insulin detemir (LEVEMIR) 100 UNIT/ML injection Inject 0.4 mLs (40 Units total) into the skin at bedtime.  ? insulin lispro (HUMALOG KWIKPEN) 100 UNIT/ML KwikPen Inject 8-14 Units into the skin 3 (three) times daily before meals.  ? Insulin Pen Needle (PEN NEEDLES 3/16") 31G X 5 MM MISC 5 Units by Does not apply route 3 (three) times daily before meals.  ? irbesartan (AVAPRO) 150 MG tablet Take 1 tablet (150 mg total) by mouth daily.  ? isosorbide mononitrate (IMDUR) 30 MG 24 hr tablet Take 1 tablet (30 mg  total) by mouth daily.  ? metoprolol succinate (TOPROL-XL) 50 MG 24 hr tablet Take 1 tablet (50 mg total) by mouth daily. Take with or immediately following a meal.  ? nitroGLYCERIN (NITROSTAT) 0.4 MG SL tablet Place 1 tablet (0.4 mg total) under the tongue every 5 (five) minutes as needed for chest pain.  ? [DISCONTINUED] LEVEMIR FLEXTOUCH 100 UNIT/ML FlexTouch Pen INJECT 40 UNITS INTO THE SKIN AT BEDTIME.  ? [DISCONTINUED] metoprolol succinate (TOPROL XL) 25 MG 24 hr tablet Take 1 tablet (25 mg total) by mouth daily.  ?   ? ?Allergies:   Insulin glargine, Lortab [hydrocodone-acetaminophen], Lipitor [atorvastatin calcium], Hydrocodone, and Iodinated contrast media  ? ?Social History  ? ?Socioeconomic History  ? Marital status: Married  ?  Spouse name: Not on file  ? Number of children: Not on file  ? Years of education: Not on file  ? Highest education level: Not on file  ?Occupational History  ? Not on file  ?Tobacco Use  ? Smoking status: Former  ?  Packs/day: 0.00  ?  Years: 15.00  ?  Pack years: 0.00  ?  Types: Cigarettes  ?  Start date: 09/11/1983  ?  Quit date: 08/30/2016  ?  Years since quitting: 5.5  ? Smokeless tobacco: Never  ?Vaping Use  ? Vaping Use: Never used  ?Substance and Sexual Activity  ? Alcohol use: Yes  ?  Comment: 16-32oz daily  ? Drug use: No  ? Sexual activity: Yes  ?Other Topics Concern  ? Not on file  ?Social History Narrative  ? Not on file  ? ?Social Determinants of Health  ? ?Financial Resource Strain: Not on file  ?Food Insecurity: Not on file  ?Transportation Needs: Not on file  ?Physical Activity: Not on file  ?Stress: Not on file  ?Social Connections: Not on file  ?  ? ?Family History: ?The patient's family history includes CAD (age of onset: 758) in his father; CAD (age of onset: 7960) in his mother; Diabetes in his father; Heart failure in his father and mother; Hyperlipidemia in his father; Hypertension in his brother, father, and mother. There is no history of Colon cancer, Colon polyps, Esophageal cancer, Pancreatic cancer, or Stomach cancer. ? ?ROS:   ?Please see the history of present illness.    ?No fevers chills nausea vomiting syncope all other systems reviewed and are negative. ? ?EKGs/Labs/Other Studies Reviewed:   ? ?The following studies were reviewed today: ?Cardiac catheterization 10/12/2021: ?  ?  Prox LAD to Mid LAD lesion is 20% stenosed with 20% stenosed side branch in 2nd Diag. ?  LPAV lesion is 55% stenosed with 55% stenosed side branch in 1st LPL. ->  RFR not significant (1.0) ?   1st Mrg stent is~ 60% stenosed.  RFR 0.91, 0.92 with FFR of 0.83.  Not significant ?  Very small caliber, nondominant prox RCA lesion is 90% stenosed. ?  LV end diastolic pressure is low. ?  There is no aortic valve stenosis. ?  ?SUMMARY ?Likely culprit lesion is very small caliber nondominant RCA with proximal 90% stenosis.  Too small for PCI ?Moderate in-stent restenosis in OM1/RI -> RFR 0.91-0.92.  R0.83.  NOT SIGNIFICANT ?Moderate distal LCx 50 to 60% prior to propagation into LPL 1 and PDA -> RFR 1.  NOT SIGNIFICANT. ?With systolic blood pressures in the 90s to low 100s, LVEDP low. ? ?Recent Labs: ?10/11/2021: BUN 12; Creatinine, Ser 0.81; Potassium 4.0; Sodium 135 ?10/12/2021: TSH 1.711 ?10/13/2021: Hemoglobin 13.0; Platelets  284  ?Recent Lipid Panel ?   ?Component Value Date/Time  ? CHOL 232 (H) 09/21/2019 0555  ? TRIG 251 (H) 09/21/2019 0555  ? HDL 29 (L) 09/21/2019 0555  ? CHOLHDL 8.0 09/21/2019 0555  ? VLDL 50 (H) 09/21/2019 0555  ? LDLCALC 153 (H) 09/21/2019 0555  ? ? ? ?Risk Assessment/Calculations:   ? ? ?    ? ?   ? ?Physical Exam:   ? ?VS:  BP 140/88   Pulse 90   Ht 5\' 9"  (1.753 m)   Wt 200 lb (90.7 kg)   SpO2 98%   BMI 29.53 kg/m?    ? ?Wt Readings from Last 3 Encounters:  ?03/21/22 200 lb (90.7 kg)  ?11/01/21 192 lb 6.4 oz (87.3 kg)  ?10/11/21 197 lb 15.6 oz (89.8 kg)  ?  ? ?GEN:  Well nourished, well developed in no acute distress ?HEENT: Normal ?NECK: No JVD; No carotid bruits ?LYMPHATICS: No lymphadenopathy ?CARDIAC: RRR, no murmurs, no rubs, gallops ?RESPIRATORY:  Clear to auscultation without rales, wheezing or rhonchi  ?ABDOMEN: Soft, non-tender, non-distended ?MUSCULOSKELETAL:  No edema; No deformity  ?SKIN: Warm and dry ?NEUROLOGIC:  Alert and oriented x 3 ?PSYCHIATRIC:  Normal affect  ? ?ASSESSMENT:   ? ?1. Medication management   ?2. Mixed hyperlipidemia   ?3. Statin intolerance   ?4. Atypical angina (HCC)   ?5. Sinus tachycardia   ?6. Diabetes mellitus with coincident hypertension  (HCC)   ?7. Coronary artery disease involving native coronary artery of native heart with angina pectoris (HCC)   ? ?PLAN:   ? ?In order of problems listed above: ? ?Mixed hyperlipidemia ?We will start Repatha, PCSK9 i

## 2022-03-21 NOTE — Patient Instructions (Signed)
Medication Instructions:  ? ?INCREASE Toprol XL to 50 mg daily ? ? ?START Irbesartan 150 mg daily ? ?You have been referred to Lipid Management for the cholesterol medication Repatha. They will call you to schedule consultation. ? ? ?Labwork: ?BMET in 2 weeks (04/04/22) ? ? ?Testing/Procedures: ?None today ? ?Follow-Up: ?2 months ? ?Any Other Special Instructions Will Be Listed Below (If Applicable). ? ?If you need a refill on your cardiac medications before your next appointment, please call your pharmacy. ? ?

## 2022-03-21 NOTE — Assessment & Plan Note (Signed)
Prior MI was associated with small nondominant RCA.  No stents were placed.  Please see cath report for details.  Personally reviewed and interpreted. ?

## 2022-03-29 ENCOUNTER — Other Ambulatory Visit: Payer: Self-pay

## 2022-03-29 ENCOUNTER — Other Ambulatory Visit: Payer: Self-pay | Admitting: Cardiology

## 2022-03-29 MED ORDER — METOPROLOL SUCCINATE ER 50 MG PO TB24: 50.0000 mg | ORAL_TABLET | Freq: Every day | ORAL | 3 refills | Status: DC

## 2022-04-23 ENCOUNTER — Ambulatory Visit (INDEPENDENT_AMBULATORY_CARE_PROVIDER_SITE_OTHER): Payer: BC Managed Care – PPO | Admitting: Pharmacist Clinician (PhC)/ Clinical Pharmacy Specialist

## 2022-04-23 VITALS — BP 146/98 | HR 96 | Resp 14 | Ht 69.0 in | Wt 191.0 lb

## 2022-04-23 DIAGNOSIS — E782 Mixed hyperlipidemia: Secondary | ICD-10-CM

## 2022-04-23 LAB — LIPID PANEL
Chol/HDL Ratio: 6.6 ratio — ABNORMAL HIGH (ref 0.0–5.0)
Cholesterol, Total: 250 mg/dL — ABNORMAL HIGH (ref 100–199)
HDL: 38 mg/dL — ABNORMAL LOW (ref >39)
LDL Chol Calc (NIH): 172 mg/dL — ABNORMAL HIGH (ref 0–99)
Triglycerides: 214 mg/dL — ABNORMAL HIGH (ref 0–149)
VLDL Cholesterol Cal: 40 mg/dL (ref 5–40)

## 2022-04-23 NOTE — Progress Notes (Unsigned)
04/24/2022 Shawn Hughes Sep 26, 1966 [id number]   HPI:  Shawn Hughes is a 56 y.o. male patient of Dr Anne Fu, who presents today for a lipid clinic evaluation.  See pertinent past medical history below.  He was seen recently by Dr. Anne Fu, who notes that patient has been unable to tolerate statins and ezetimibe.    Past Medical History: CAD RCA 90% stenosis, too small for PCI; distal LCx 50-60% stenosis  DM2 11/22 - A1c 9.8 - humalog, lantus   hypertension Controlled on irbesartan, metoprolol succ   Current Medications: none  Cholesterol Goals: LDL < 55   Intolerant/previously tried: atorvastatin, rosuvastatin - myalgias, ezetimibe - nausea  Family historyfather died at 48, MI, already with stents bypass at MI death; mother died at 61 massive MI; 1 brother with hypertnesion, hld; 2 children 16,28   Diet: 60/40 home cooked, eat out is sit down; loves broccoli; protein mostly fish; not much for beef; snacks some on crackers, no deep fried  Exercise:  walk 4/7 days - about 2 miles per day  Labs: (today) TC 250, TG 214, HDL 38, LDL 172   Current Outpatient Medications  Medication Sig Dispense Refill   aspirin EC 81 MG tablet Take 1 tablet (81 mg total) by mouth daily with breakfast. 30 tablet 3   insulin detemir (LEVEMIR) 100 UNIT/ML injection Inject 0.4 mLs (40 Units total) into the skin at bedtime. 15 mL 2   insulin lispro (HUMALOG KWIKPEN) 100 UNIT/ML KwikPen Inject 8-14 Units into the skin 3 (three) times daily before meals. 15 mL 3   Insulin Pen Needle (PEN NEEDLES 3/16") 31G X 5 MM MISC 5 Units by Does not apply route 3 (three) times daily before meals. 100 each 3   irbesartan (AVAPRO) 150 MG tablet Take 1 tablet (150 mg total) by mouth daily. 90 tablet 3   isosorbide mononitrate (IMDUR) 30 MG 24 hr tablet Take 1 tablet (30 mg total) by mouth daily. 30 tablet 3   metoprolol succinate (TOPROL-XL) 50 MG 24 hr tablet Take 1 tablet (50 mg total) by mouth daily. Take with or  immediately following a meal. 90 tablet 3   nitroGLYCERIN (NITROSTAT) 0.4 MG SL tablet Place 1 tablet (0.4 mg total) under the tongue every 5 (five) minutes as needed for chest pain. 30 tablet 4   No current facility-administered medications for this visit.    Allergies  Allergen Reactions   Insulin Glargine Anaphylaxis    Anaphylaxis    Lortab [Hydrocodone-Acetaminophen] Itching   Lipitor [Atorvastatin Calcium] Other (See Comments)    Extreme muscle weakness, passing out, generalized pain all through body   Hydrocodone Itching   Zocor [Simvastatin]     MYALGIAS   Iodinated Contrast Media Itching    Pt given contrast and began itching after injection.     Past Medical History:  Diagnosis Date   CAD (coronary artery disease)    a. 08/2016: cath showing 40% prox RCA, 10% LCx, 20% LAD and 99% RI stenosis. DES to RI.   COVID-14 October 2019 - was not hospitalized   Essential hypertension    GERD (gastroesophageal reflux disease)    History of nephrolithiasis    Mixed hyperlipidemia    Type 2 diabetes mellitus (HCC)     Blood pressure (!) 146/98, pulse 96, resp. rate 14, height 5\' 9"  (1.753 m), weight 191 lb (86.6 kg), SpO2 97 %.   Mixed hyperlipidemia Patient with elevated LDL cholesterol and ASCVD, currently not on medication.  Was unable to tolerate multiple statin drugs as well as ezetimibe.  Reviewed options for lowering LDL cholesterol, including ezetimibe, PCSK-9 inhibitors, bempedoic acid and inclisiran.  Discussed mechanisms of action, dosing, side effects and potential decreases in LDL cholesterol.  Also reviewed cost information and potential options for patient assistance.  Answered all patient questions.  Based on this information, patient would prefer to start PCSK9.  Insurance preference is Repatha. Will start PA and once approved pt will do one injection every 14 days.  He will need to repeat labs after 4-6 doses.     Tommy Medal PharmD CPP [facility] [address] [place], [place] [address] [phone]

## 2022-04-23 NOTE — Patient Instructions (Signed)
Your Results:             Your most recent labs Goal  Total Cholesterol will < 200  Triglycerides draw < 150  HDL (happy/good cholesterol) labs > 40  LDL (lousy/bad cholesterol today < 55   Medication changes:  We will start the process to get Repatha covered by your insurance.  Once the prior authorization is complete, [facility] will call you to let you know and confirm pharmacy information.   You will take one injection every 14 days.    Lab orders:  We want to repeat labs after 2-3 months.  We will send you a lab order to remind you once we get closer to that time.     Thank you for choosing [facility]

## 2022-04-24 ENCOUNTER — Encounter: Payer: Self-pay | Admitting: Pharmacist Clinician (PhC)/ Clinical Pharmacy Specialist

## 2022-04-24 NOTE — Assessment & Plan Note (Signed)
Patient with elevated LDL cholesterol and ASCVD, currently not on medication.  Was unable to tolerate multiple statin drugs as well as ezetimibe.  Reviewed options for lowering LDL cholesterol, including ezetimibe, PCSK-9 inhibitors, bempedoic acid and inclisiran.  Discussed mechanisms of action, dosing, side effects and potential decreases in LDL cholesterol.  Also reviewed cost information and potential options for patient assistance.  Answered all patient questions.  Based on this information, patient would prefer to start PCSK9.  Insurance preference is Repatha. Will start PA and once approved pt will do one injection every 14 days.  He will need to repeat labs after 4-6 doses.

## 2022-04-28 ENCOUNTER — Emergency Department (HOSPITAL_COMMUNITY)
Admission: EM | Admit: 2022-04-28 | Discharge: 2022-04-28 | Discharge status: Against Medical Advice | Payer: BC Managed Care – PPO | Source: Home / Self Care

## 2022-05-07 ENCOUNTER — Telehealth: Payer: Self-pay | Admitting: Pharmacist Clinician (PhC)/ Clinical Pharmacy Specialist

## 2022-05-07 DIAGNOSIS — E782 Mixed hyperlipidemia: Secondary | ICD-10-CM

## 2022-05-07 MED ORDER — REPATHA SURECLICK 140 MG/ML ~~LOC~~ SOAJ
140.0000 mg | SUBCUTANEOUS | 12 refills | Status: DC
Start: 2022-05-07 — End: 2023-07-02

## 2022-05-07 NOTE — Telephone Encounter (Signed)
Repatha 140 mg PA approved to 05/07/23.  Will need lipid/liver labs in 3 months

## 2022-06-01 IMAGING — CT CT ANGIO CHEST
2 of 6 series · 19 of 46 positions shown · IV contrast (Omnipaque or Isovue)
Comparison: Chest radiograph performed on the same date

CLINICAL DATA: Chest pain

EXAM:
CT ANGIOGRAPHY CHEST WITH CONTRAST
TECHNIQUE: Multidetector CT imaging of the chest was performed using the
standard protocol during bolus administration of intravenous
contrast. Multiplanar CT image reconstructions and MIPs were
obtained to evaluate the vascular anatomy.
CONTRAST:  75mL OMNIPAQUE IOHEXOL 350 MG/ML SOLN

[Series 5: pe axial thins · axial · 0.70mm/px · z∈[+1210,+1470]mm · 16 of 355 slices shown]
[im 15/355  lung]
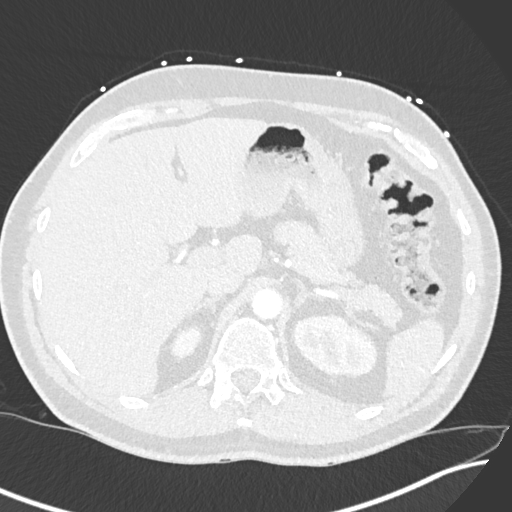
[im 45/355  soft-tissue]
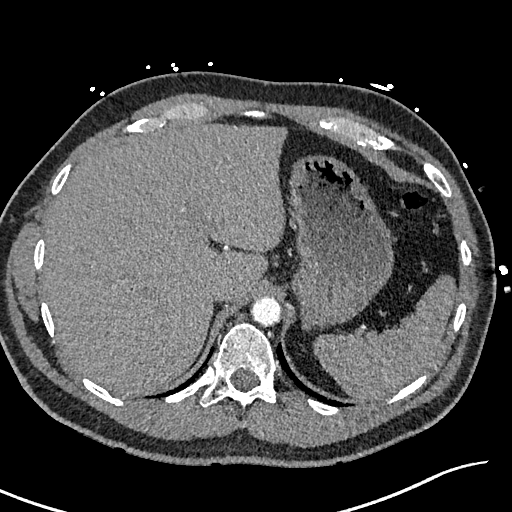
[im 60/355  lung]
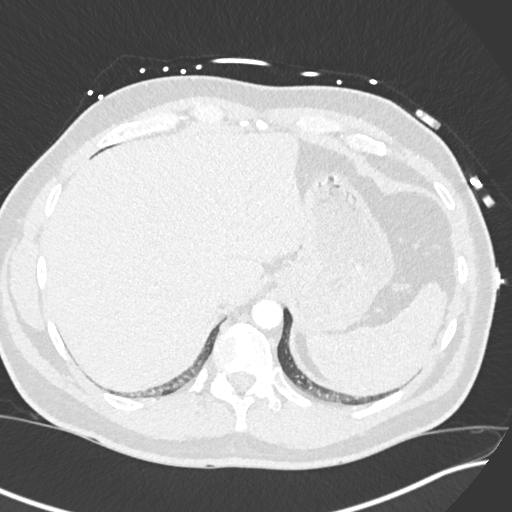
[im 89/355  soft-tissue]
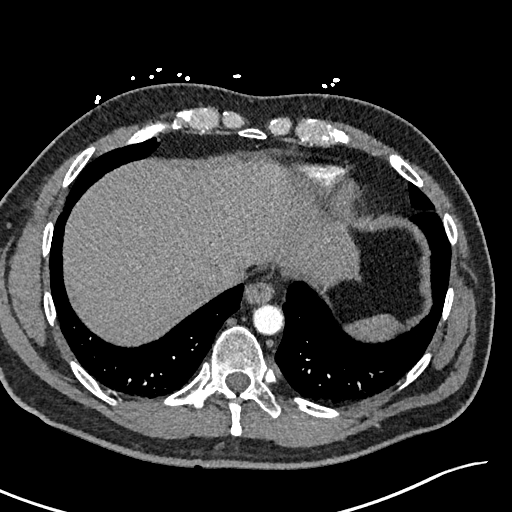
[im 104/355  lung]
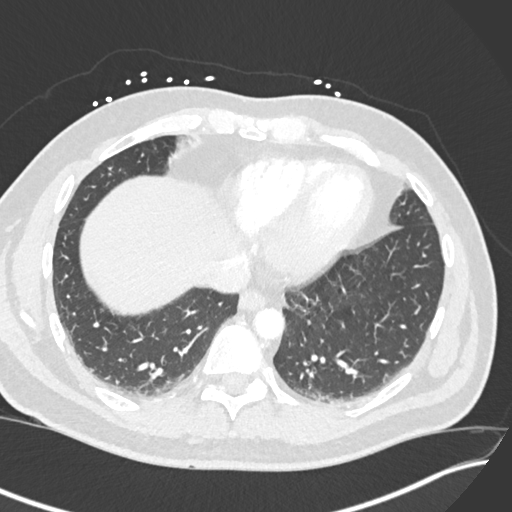
[im 119/355  soft-tissue]
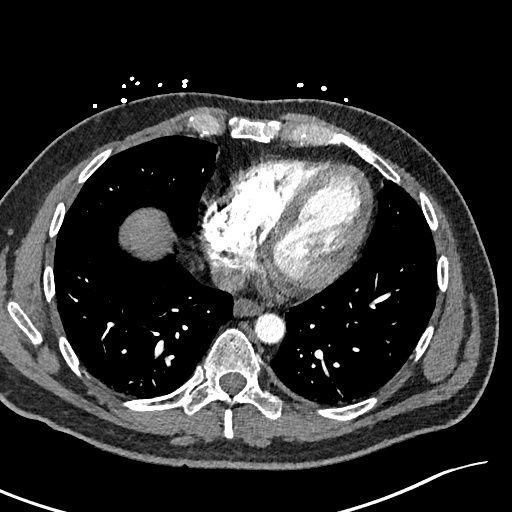
[im 148/355  lung]
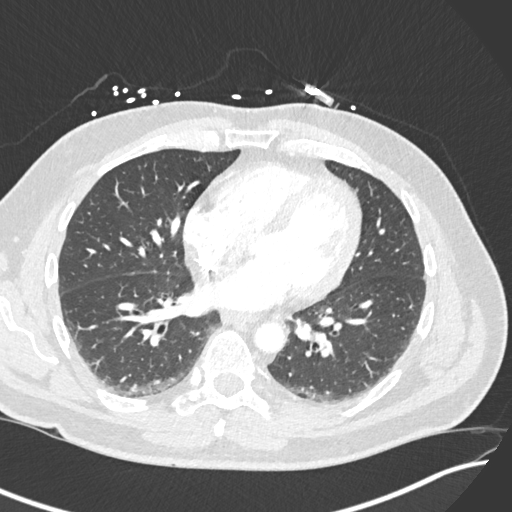
[im 163/355  soft-tissue]
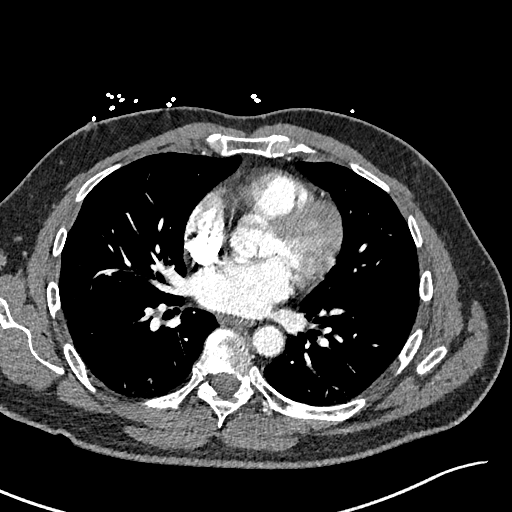
[im 192/355  lung]
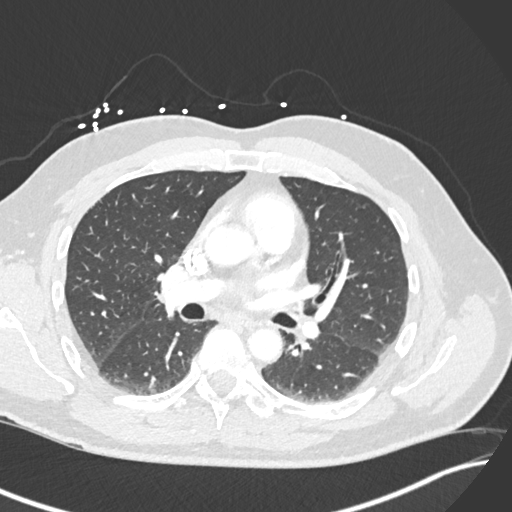
[im 207/355  soft-tissue]
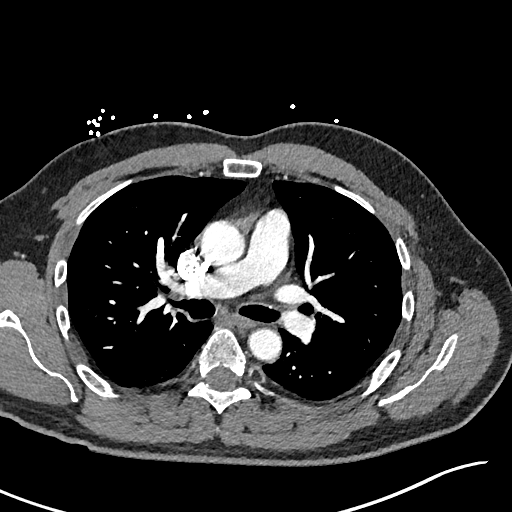
[im 237/355  lung]
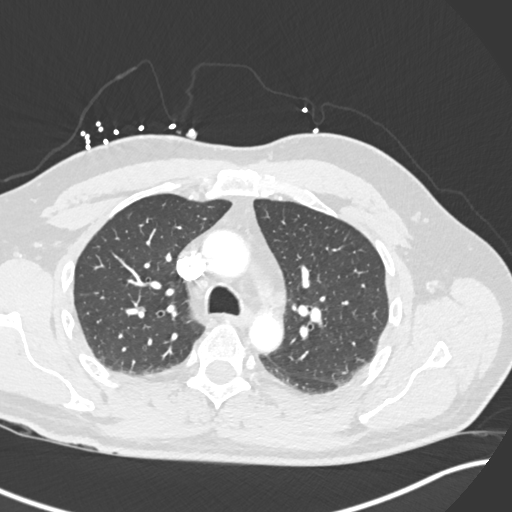
[im 251/355  soft-tissue]
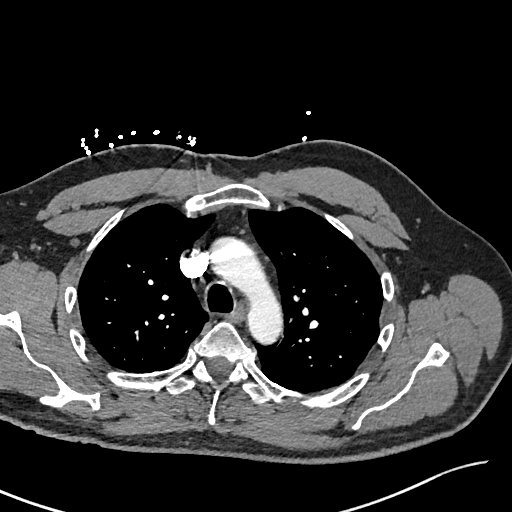
[im 266/355  lung]
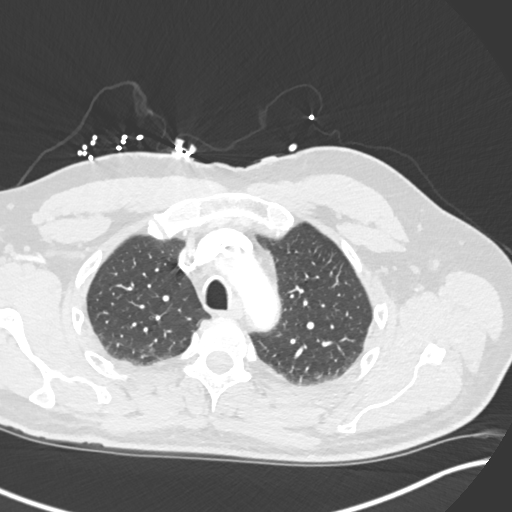
[im 296/355  soft-tissue]
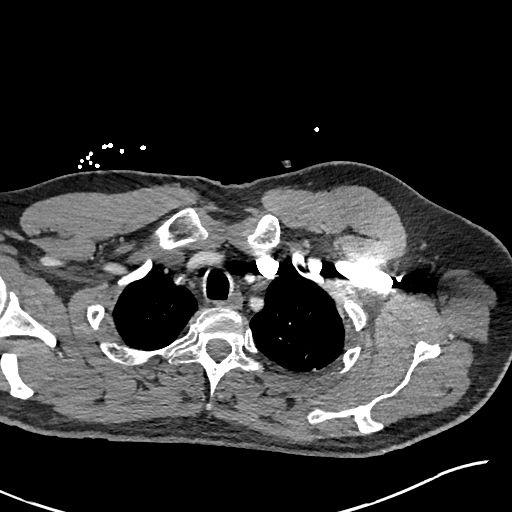
[im 310/355  lung]
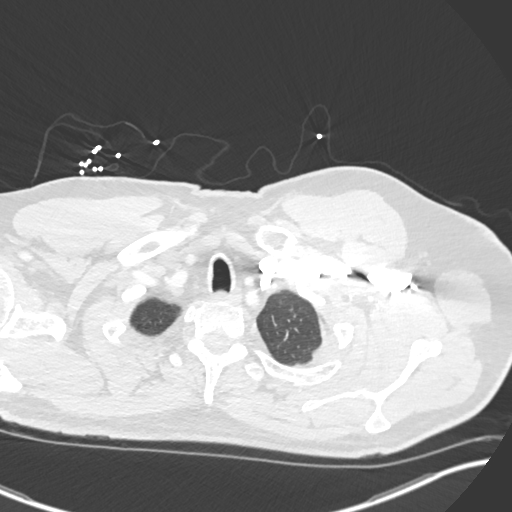
[im 340/355  soft-tissue]
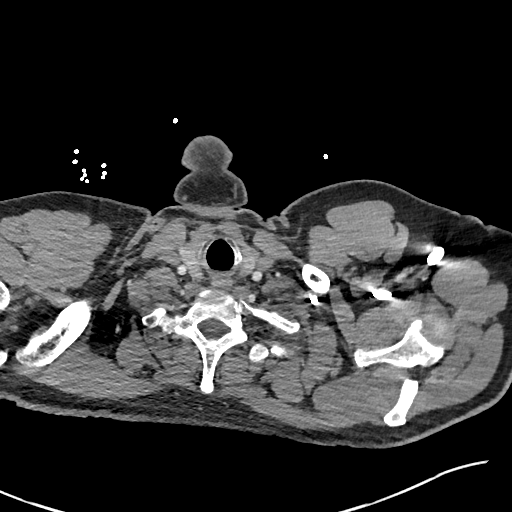

[Series 7: cor soft · coronal · 0.57mm/px · 3 of 151 slices shown]
[im 38/151  soft-tissue]
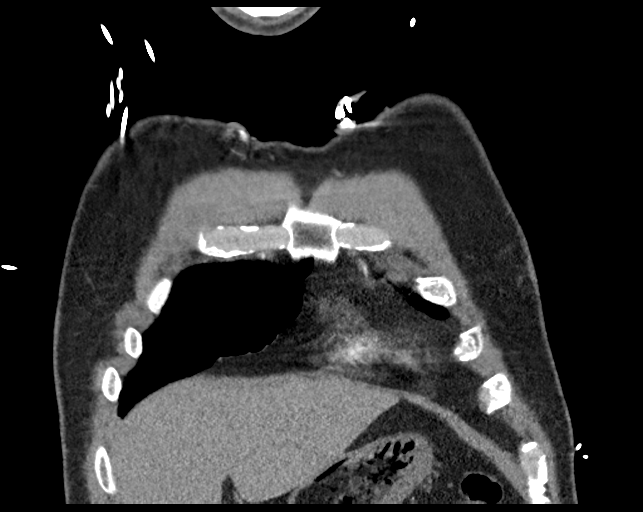
[im 76/151  soft-tissue]
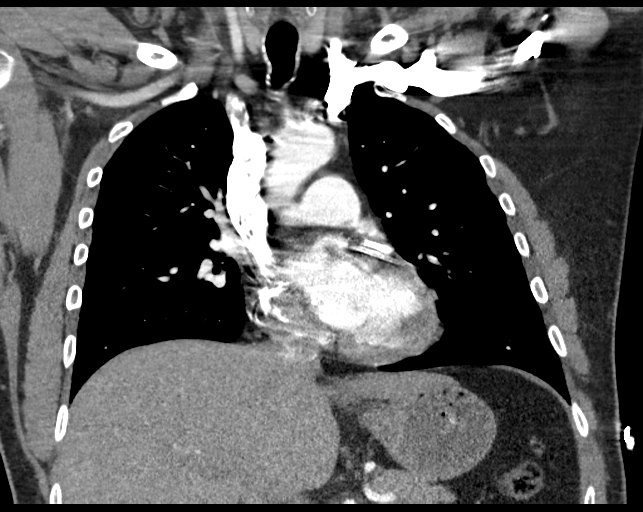
[im 113/151  soft-tissue]
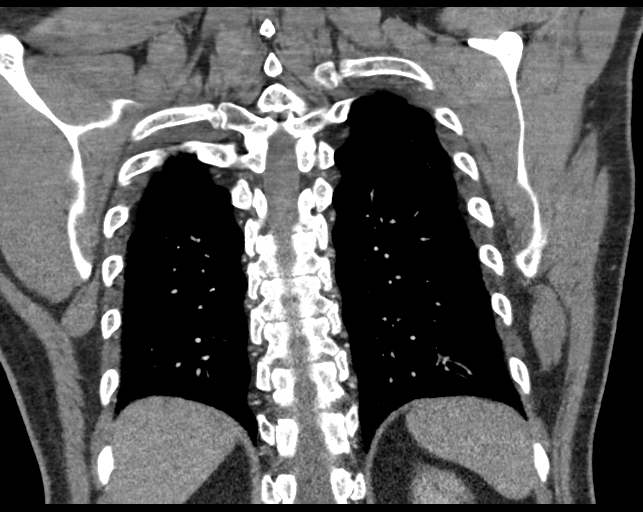

[19 of 46 positions shown; findings below may reference images not displayed]

FINDINGS: Cardiovascular: Satisfactory opacification of the pulmonary arteries
to the segmental level. No evidence of pulmonary embolism. Normal
heart size. Vascular stent in the circumflex artery no pericardial
effusion.

Mediastinum/Nodes: No enlarged mediastinal, hilar, or axillary lymph
nodes. Thyroid gland, trachea, and esophagus demonstrate no
significant findings.

Lungs/Pleura: Lungs are clear. No pleural effusion or pneumothorax.

Upper Abdomen: No acute abnormality.

Musculoskeletal: No chest wall abnormality. No acute or significant
osseous findings.

Review of the MIP images confirms the above findings.
IMPRESSION: 1.  No evidence of pulmonary embolism.

2.  No acute cardiopulmonary process.

## 2022-06-01 IMAGING — DX DG CHEST 2V
2 series · 2 of 2 positions shown · non-contrast
Comparison: 09/20/2019

CLINICAL DATA: Chest pain short of breath

EXAM:
CHEST - 2 VIEW

[chest pa]
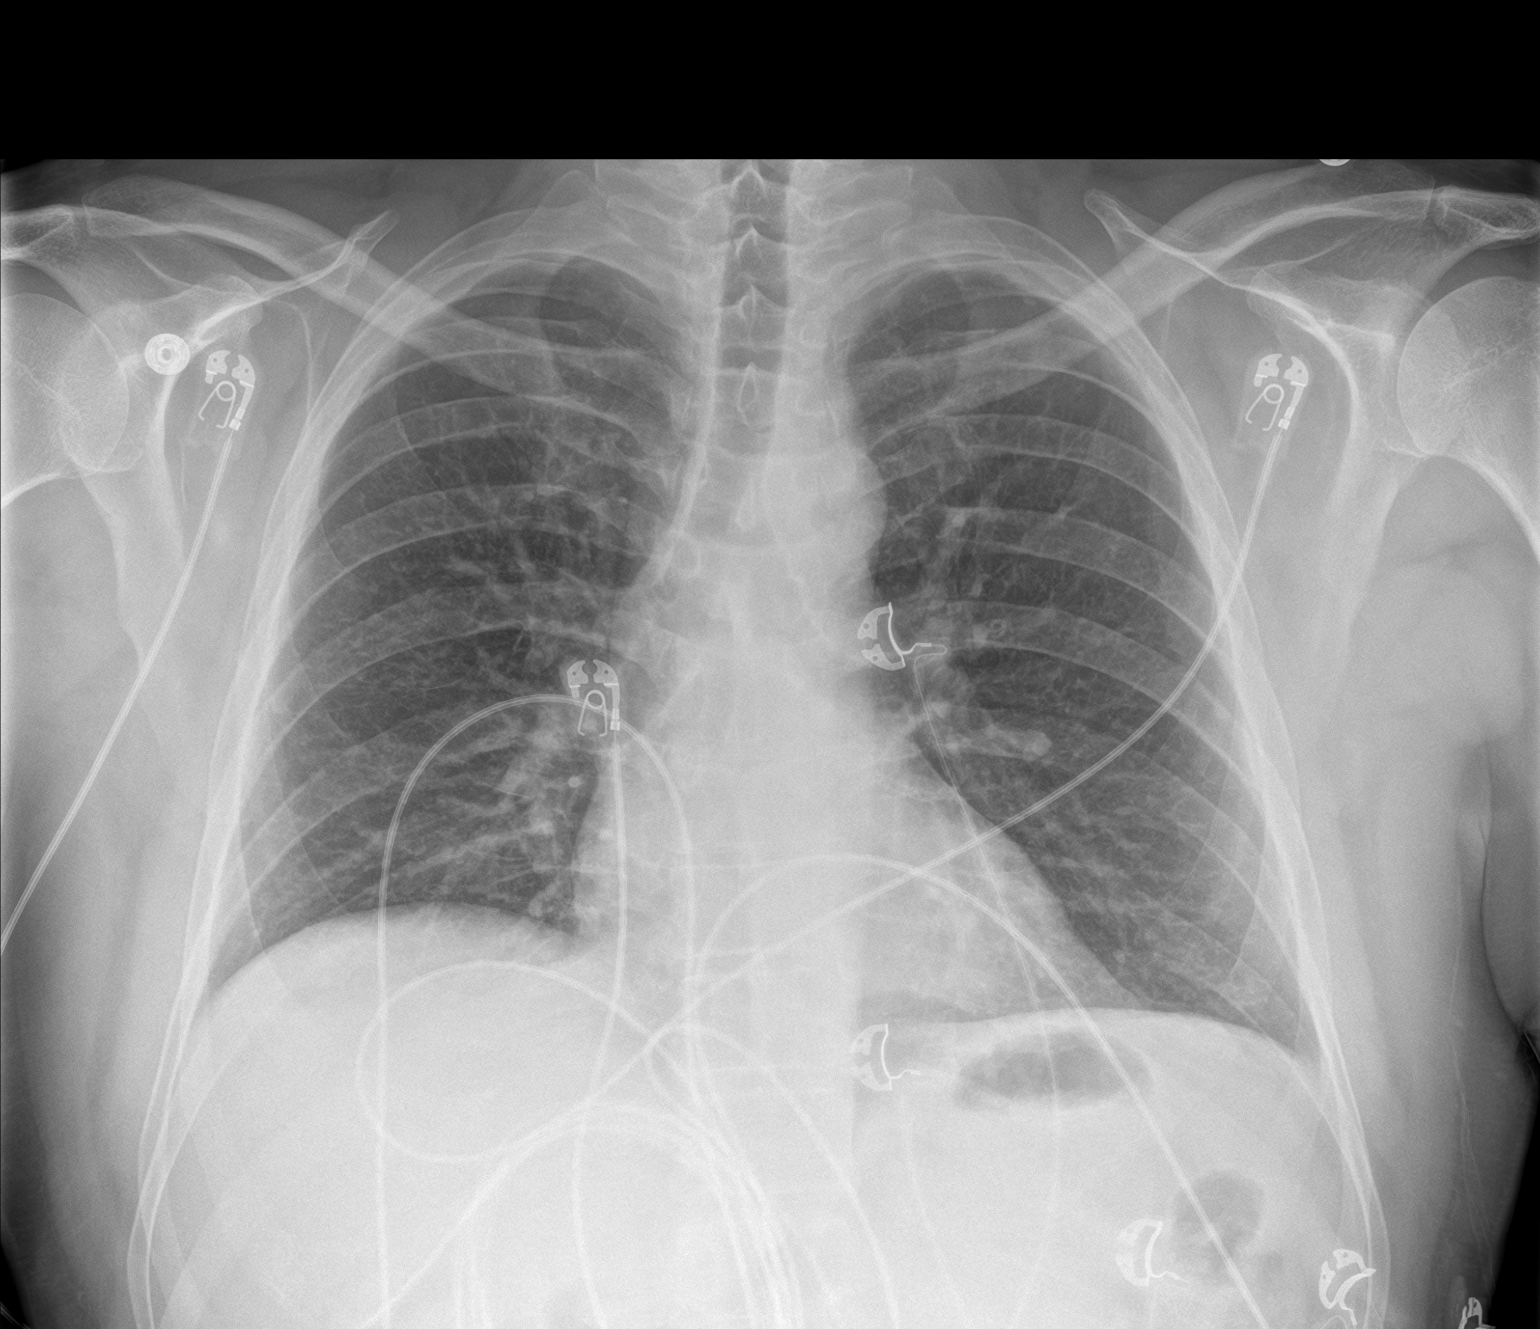

[chest lat]
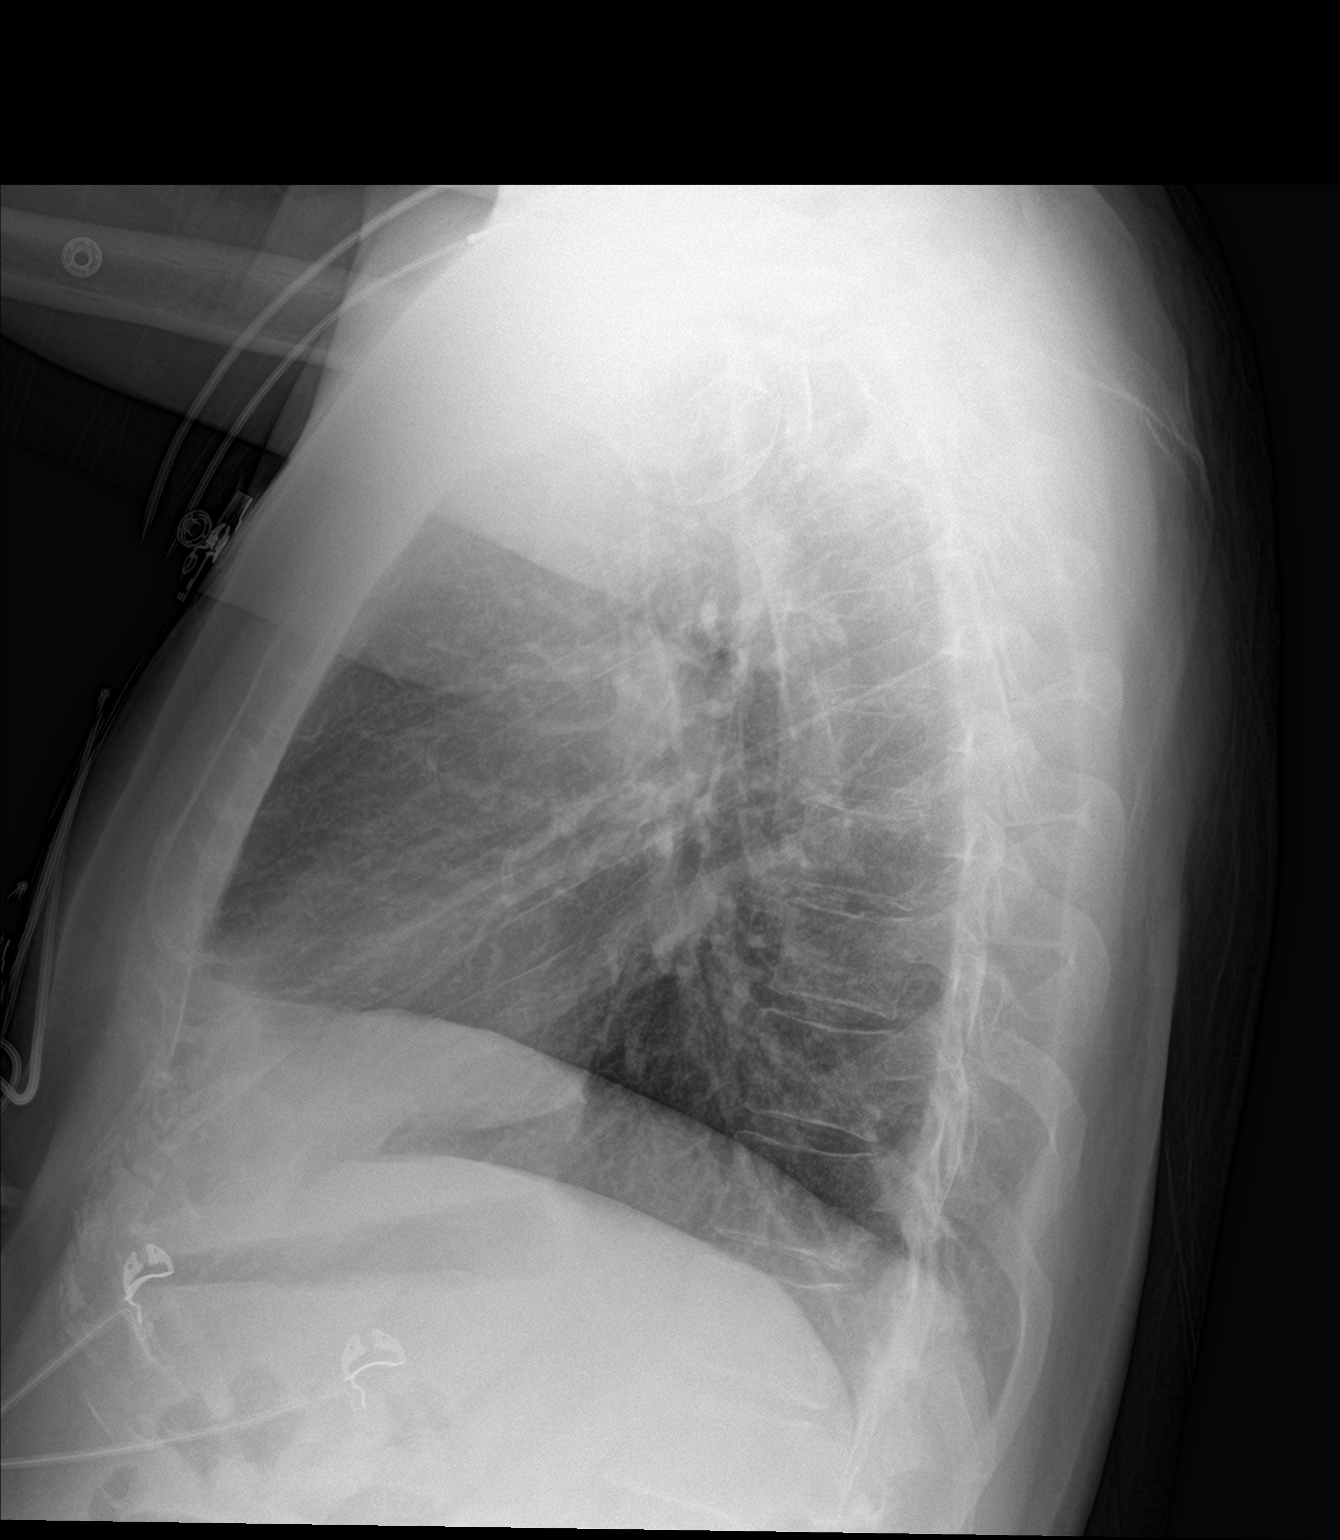

[2 of 2 positions shown; findings below may reference images not displayed]

FINDINGS: The heart size and mediastinal contours are within normal limits.
Left coronary stent noted. Both lungs are clear. The visualized
skeletal structures are unremarkable.
IMPRESSION: No active cardiopulmonary disease.

## 2022-06-04 ENCOUNTER — Ambulatory Visit: Payer: Self-pay | Admitting: Cardiology

## 2022-06-09 NOTE — Progress Notes (Deleted)
Cardiology Office Note   Date:  06/09/2022   ID:  Shawn Hughes, DOB 1966/01/30, MRN [id number]  PCP:  Pcp, No  Cardiologist:  Dr. Diona Browner    No chief complaint on file.     History of Present Illness: Shawn Hughes is a 56 y.o. male who presents for CAD with cath 2022, showed small caliber nondominant RCA with 90% stenosis too small for PCI.  Medical management.  other hx of DM. Had had some sinus tachycardia where beta-blocker was increased to 25 mg a day and TSH was unremarkable.  His prior hemoglobin A1c was 9.8.  He had been intolerant to several different statins including Lipitor Crestor pravastatin and fatigue with Zetia.  PCSK9 inhibitor was discussed previously.   He has been dealing with higher than normal blood pressures.  He is not currently on any cholesterol medication.  He cannot tolerate statins he states.  He has been having trouble controlling his glucose has been in the 300 range.  Had some burning in his left neck left shoulder area this morning.  Also feels tachycardic   Was seen in lipid clinic and placed on Repatha.   Past Medical History:  Diagnosis Date   CAD (coronary artery disease)    a. 08/2016: cath showing 40% prox RCA, 10% LCx, 20% LAD and 99% RI stenosis. DES to RI.   COVID-14 October 2019 - was not hospitalized   Essential hypertension    GERD (gastroesophageal reflux disease)    History of nephrolithiasis    Mixed hyperlipidemia    Type 2 diabetes mellitus (HCC)     Past Surgical History:  Procedure Laterality Date   CARDIAC CATHETERIZATION N/A 08/30/2016   Procedure: Left Heart Cath and Coronary Angiography;  Surgeon: Kathleene Hazel, MD;  Location: [facility] INVASIVE CV LAB;  Service: Cardiovascular;  Laterality: N/A;   CARDIAC CATHETERIZATION N/A 08/30/2016   Procedure: Coronary Stent Intervention;  Surgeon: Kathleene Hazel, MD;  Location: [facility] INVASIVE CV LAB;  Service: Cardiovascular; 90% prox RI - > DES PCI Promus  Premier 2.5 x 16 (2.75 mm)   ESOPHAGOGASTRODUODENOSCOPY  01/10/2012   KGY:JEHUDJSHF in the distal esophagus2O TO GERD/Mild gastritis   INTRAVASCULAR PRESSURE WIRE/FFR STUDY N/A 10/12/2021   Procedure: INTRAVASCULAR PRESSURE WIRE/FFR STUDY;  Surgeon: Marykay Lex, MD;  Location: [facility] INVASIVE CV LAB;  Service: Cardiovascular;  Laterality: N/A;   LACERATION REPAIR     Right arm   LEFT HEART CATH AND CORS/GRAFTS ANGIOGRAPHY N/A 10/12/2021   Procedure: LEFT HEART CATH AND CORS/GRAFTS ANGIOGRAPHY;  Surgeon: Marykay Lex, MD;  Location: [facility] INVASIVE CV LAB;  Service: Cardiovascular;  Laterality: N/A;     Current Outpatient Medications  Medication Sig Dispense Refill   aspirin EC 81 MG tablet Take 1 tablet (81 mg total) by mouth daily with breakfast. 30 tablet 3   Evolocumab (REPATHA SURECLICK) 140 MG/ML SOAJ Inject 140 mg into the skin every 14 (fourteen) days. 2 mL 12   insulin detemir (LEVEMIR) 100 UNIT/ML injection Inject 0.4 mLs (40 Units total) into the skin at bedtime. 15 mL 2   insulin lispro (HUMALOG KWIKPEN) 100 UNIT/ML KwikPen Inject 8-14 Units into the skin 3 (three) times daily before meals. 15 mL 3   Insulin Pen Needle (PEN NEEDLES 3/16") 31G X 5 MM MISC 5 Units by Does not apply route 3 (three) times daily before meals. 100 each 3   irbesartan (AVAPRO) 150 MG tablet Take 1 tablet (150 mg total)  by mouth daily. 90 tablet 3   isosorbide mononitrate (IMDUR) 30 MG 24 hr tablet Take 1 tablet (30 mg total) by mouth daily. 30 tablet 3   metoprolol succinate (TOPROL-XL) 50 MG 24 hr tablet Take 1 tablet (50 mg total) by mouth daily. Take with or immediately following a meal. 90 tablet 3   nitroGLYCERIN (NITROSTAT) 0.4 MG SL tablet Place 1 tablet (0.4 mg total) under the tongue every 5 (five) minutes as needed for chest pain. 30 tablet 4   No current facility-administered medications for this visit.    Allergies:   Insulin glargine, Lortab [hydrocodone-acetaminophen], Lipitor  [atorvastatin calcium], Hydrocodone, Zocor [simvastatin], and Iodinated contrast media    Social History:  The patient  reports that he quit smoking about 5 years ago. His smoking use included cigarettes. He started smoking about 38 years ago. He has never used smokeless tobacco. He reports current alcohol use. He reports that he does not use drugs.   Family History:  The patient's ***family history includes CAD (age of onset: 53) in his father; CAD (age of onset: 7) in his mother; Diabetes in his father; Heart failure in his father and mother; Hyperlipidemia in his father; Hypertension in his brother, father, and mother.    ROS:  General:no colds or fevers, no weight changes Skin:no rashes or ulcers HEENT:no blurred vision, no congestion CV:see HPI PUL:see HPI GI:no diarrhea constipation or melena, no indigestion GU:no hematuria, no dysuria MS:no joint pain, no claudication Neuro:no syncope, no lightheadedness Endo:no diabetes, no thyroid disease Wt Readings from Last 3 Encounters:  04/23/22 191 lb (86.6 kg)  03/21/22 200 lb (90.7 kg)  11/01/21 192 lb 6.4 oz (87.3 kg)     PHYSICAL EXAM: VS:  There were no vitals taken for this visit. , BMI There is no height or weight on file to calculate BMI. General:Pleasant affect, NAD Skin:Warm and dry, brisk capillary refill HEENT:normocephalic, sclera clear, mucus membranes moist Neck:supple, no JVD, no bruits  Heart:S1S2 RRR without murmur, gallup, rub or click Lungs:clear without rales, rhonchi, or wheezes WGN:FAOZ, non tender, + BS, do not palpate liver spleen or masses Ext:no lower ext edema, 2+ pedal pulses, 2+ radial pulses Neuro:alert and oriented, MAE, follows commands, + facial symmetry    EKG:  EKG is ordered today. The ekg ordered today demonstrates ***   Recent Labs: 10/11/2021: BUN 12; Creatinine, Ser 0.81; Potassium 4.0; Sodium 135 10/12/2021: TSH 1.711 10/13/2021: Hemoglobin 13.0; Platelets 284    Lipid Panel     Component Value Date/Time   CHOL 250 (H) 04/23/2022 1144   TRIG 214 (H) 04/23/2022 1144   HDL 38 (L) 04/23/2022 1144   CHOLHDL 6.6 (H) 04/23/2022 1144   CHOLHDL 8.0 09/21/2019 0555   VLDL 50 (H) 09/21/2019 0555   LDLCALC 172 (H) 04/23/2022 1144       Other studies Reviewed: Additional studies/ records that were reviewed today include: ***. Cardiac catheterization 10/12/2021:     Prox LAD to Mid LAD lesion is 20% stenosed with 20% stenosed side branch in 2nd Diag.   LPAV lesion is 55% stenosed with 55% stenosed side branch in 1st LPL. ->  RFR not significant (1.0)   1st Mrg stent is~ 60% stenosed.  RFR 0.91, 0.92 with FFR of 0.83.  Not significant   Very small caliber, nondominant prox RCA lesion is 90% stenosed.   LV end diastolic pressure is low.   There is no aortic valve stenosis.   SUMMARY Likely culprit lesion is very small  caliber nondominant RCA with proximal 90% stenosis.  Too small for PCI Moderate in-stent restenosis in OM1/RI -> RFR 0.91-0.92.  R0.83.  NOT SIGNIFICANT Moderate distal LCx 50 to 60% prior to propagation into LPL 1 and PDA -> RFR 1.  NOT SIGNIFICANT. With systolic blood pressures in the 90s to low 100s, LVEDP low.  ASSESSMENT AND PLAN:  1.  ***   Current medicines are reviewed with the patient today.  The patient Has no concerns regarding medicines.  The following changes have been made:  See above Labs/ tests ordered today include:see above  Disposition:   FU:  see above  Signed, Nada Boozer, NP  06/09/2022 9:51 PM    [facility] [address] [place], [place], [place]  [address]/ [address] [place], [place] Phone: [phone]; Fax: [phone]  [phone]

## 2022-06-10 ENCOUNTER — Encounter: Payer: Self-pay | Admitting: Cardiology

## 2022-06-10 ENCOUNTER — Ambulatory Visit: Payer: Self-pay | Admitting: Cardiology

## 2022-07-08 ENCOUNTER — Encounter: Payer: Self-pay | Admitting: Pharmacist Clinician (PhC)/ Clinical Pharmacy Specialist

## 2022-07-08 DIAGNOSIS — Z79899 Other long term (current) drug therapy: Secondary | ICD-10-CM

## 2022-07-08 DIAGNOSIS — E782 Mixed hyperlipidemia: Secondary | ICD-10-CM

## 2022-09-11 ENCOUNTER — Other Ambulatory Visit: Payer: Self-pay | Admitting: Cardiology

## 2022-10-08 ENCOUNTER — Emergency Department (HOSPITAL_COMMUNITY)
Admission: EM | Admit: 2022-10-08 | Discharge: 2022-10-08 | Discharge status: Against Medical Advice | Payer: BC Managed Care – PPO | Attending: Emergency Medicine | Admitting: Emergency Medicine

## 2022-10-08 ENCOUNTER — Emergency Department (HOSPITAL_COMMUNITY): Payer: BC Managed Care – PPO

## 2022-10-08 ENCOUNTER — Encounter (HOSPITAL_COMMUNITY): Payer: Self-pay

## 2022-10-08 DIAGNOSIS — R0789 Other chest pain: Secondary | ICD-10-CM | POA: Diagnosis present

## 2022-10-08 DIAGNOSIS — Z5321 Procedure and treatment not carried out due to patient leaving prior to being seen by health care provider: Secondary | ICD-10-CM | POA: Insufficient documentation

## 2022-10-08 DIAGNOSIS — R0602 Shortness of breath: Secondary | ICD-10-CM | POA: Insufficient documentation

## 2022-10-08 DIAGNOSIS — R110 Nausea: Secondary | ICD-10-CM | POA: Insufficient documentation

## 2022-10-08 LAB — COMPREHENSIVE METABOLIC PANEL
ALT: 21 U/L (ref 0–44)
AST: 17 U/L (ref 15–41)
Albumin: 4.3 g/dL (ref 3.5–5.0)
Alkaline Phosphatase: 74 U/L (ref 38–126)
Anion gap: 11 (ref 5–15)
BUN: 12 mg/dL (ref 6–20)
CO2: 21 mmol/L — ABNORMAL LOW (ref 22–32)
Calcium: 9.5 mg/dL (ref 8.9–10.3)
Chloride: 103 mmol/L (ref 98–111)
Creatinine, Ser: 0.88 mg/dL (ref 0.61–1.24)
GFR, Estimated: >60 mL/min (ref >60)
Glucose, Bld: 297 mg/dL — ABNORMAL HIGH (ref 70–99)
Potassium: 4.1 mmol/L (ref 3.5–5.1)
Sodium: 135 mmol/L (ref 135–145)
Total Bilirubin: 0.7 mg/dL (ref 0.3–1.2)
Total Protein: 7.1 g/dL (ref 6.5–8.1)

## 2022-10-08 LAB — CBC
HCT: 46 % (ref 39.0–52.0)
Hemoglobin: 16.1 g/dL (ref 13.0–17.0)
MCH: 31.9 pg (ref 26.0–34.0)
MCHC: 35 g/dL (ref 30.0–36.0)
MCV: 91.1 fL (ref 80.0–100.0)
Platelets: 323 10*3/uL (ref 150–400)
RBC: 5.05 MIL/uL (ref 4.22–5.81)
RDW: 12.5 % (ref 11.5–15.5)
WBC: 8.2 10*3/uL (ref 4.0–10.5)
nRBC: 0 % (ref 0.0–0.2)

## 2022-10-08 LAB — TROPONIN I (HIGH SENSITIVITY): Troponin I (High Sensitivity): 4 ng/L

## 2022-10-08 MED ORDER — NITROGLYCERIN 0.4 MG SL SUBL: 0.4000 mg | SUBLINGUAL_TABLET | Freq: Once | SUBLINGUAL | Status: DC

## 2022-10-08 NOTE — ED Triage Notes (Signed)
Pt arrives via [facility] from work for midsternal non-radiating CP. Pt states pain is mostly relieved after taking one SL nitro. Pt given 324 mg ASA with EMS. Pt has hx of MI in the past.   EMS last VS 139/95, HR 105, 97% on RA, CBG 319.

## 2022-10-08 NOTE — ED Provider Triage Note (Signed)
Emergency Medicine Provider Triage Evaluation Note  Shawn Hughes , a 56 y.o. male  was evaluated in triage.  Pt complains of chest pain localized to middle of chest starting at 9AM today, off and on since 11AM. Given nitroglycerin by EMS and had relief of symptoms. Hx of 1 stent. C/o chest tightness currently.  Associated nausea, SOB. Given 324 aspirin in ambulance.   Review of Systems  Positive: Chest pain, SOB, nausea Negative: No radiation to jaw or neck  Physical Exam  BP (!) 127/90 (BP Location: Right Arm)   Pulse 95   Temp 97.8 F (36.6 C) (Oral)   Resp 16   Ht 5\' 9"  (1.753 m)   Wt 88 kg   SpO2 99%   BMI 28.65 kg/m  Gen:   Awake, no distress   Resp:  Normal effort  MSK:   Moves extremities without difficulty  Other:    Medical Decision Making  Medically screening exam initiated at 12:32 PM.  Appropriate orders placed.  Izaih D Nou was informed that the remainder of the evaluation will be completed by another provider, this initial triage assessment does not replace that evaluation, and the importance of remaining in the ED until their evaluation is complete.     Maisie Fus, Pete Pelt 10/08/22 1235

## 2022-10-08 NOTE — ED Notes (Signed)
Called pt for vs check x3 no response 

## 2022-10-26 ENCOUNTER — Other Ambulatory Visit: Payer: Self-pay | Admitting: "Endocrinology

## 2023-01-23 ENCOUNTER — Encounter: Payer: Self-pay | Admitting: Radiology

## 2023-04-08 ENCOUNTER — Other Ambulatory Visit (HOSPITAL_COMMUNITY): Payer: Self-pay

## 2023-05-15 ENCOUNTER — Encounter (HOSPITAL_COMMUNITY): Payer: Self-pay | Admitting: *Deleted

## 2023-05-15 ENCOUNTER — Emergency Department (HOSPITAL_COMMUNITY): Payer: BC Managed Care – PPO

## 2023-05-15 ENCOUNTER — Emergency Department (HOSPITAL_COMMUNITY)
Admission: EM | Admit: 2023-05-15 | Discharge: 2023-05-15 | Disposition: A | Discharge status: Home / Self Care | Payer: BC Managed Care – PPO | Attending: Emergency Medicine | Admitting: Emergency Medicine

## 2023-05-15 ENCOUNTER — Other Ambulatory Visit: Payer: Self-pay

## 2023-05-15 DIAGNOSIS — Z794 Long term (current) use of insulin: Secondary | ICD-10-CM | POA: Diagnosis not present

## 2023-05-15 DIAGNOSIS — Z79899 Other long term (current) drug therapy: Secondary | ICD-10-CM | POA: Insufficient documentation

## 2023-05-15 DIAGNOSIS — I10 Essential (primary) hypertension: Secondary | ICD-10-CM | POA: Diagnosis not present

## 2023-05-15 DIAGNOSIS — R61 Generalized hyperhidrosis: Secondary | ICD-10-CM | POA: Diagnosis not present

## 2023-05-15 DIAGNOSIS — I2510 Atherosclerotic heart disease of native coronary artery without angina pectoris: Secondary | ICD-10-CM | POA: Insufficient documentation

## 2023-05-15 DIAGNOSIS — E119 Type 2 diabetes mellitus without complications: Secondary | ICD-10-CM | POA: Diagnosis not present

## 2023-05-15 DIAGNOSIS — R0789 Other chest pain: Secondary | ICD-10-CM | POA: Insufficient documentation

## 2023-05-15 DIAGNOSIS — Z7982 Long term (current) use of aspirin: Secondary | ICD-10-CM | POA: Insufficient documentation

## 2023-05-15 DIAGNOSIS — Z955 Presence of coronary angioplasty implant and graft: Secondary | ICD-10-CM | POA: Diagnosis not present

## 2023-05-15 DIAGNOSIS — R079 Chest pain, unspecified: Secondary | ICD-10-CM

## 2023-05-15 LAB — CBG MONITORING, ED
Glucose-Capillary: 55 mg/dL — ABNORMAL LOW (ref 70–99)
Glucose-Capillary: 95 mg/dL (ref 70–99)

## 2023-05-15 LAB — BASIC METABOLIC PANEL
Anion gap: 8 (ref 5–15)
BUN: 15 mg/dL (ref 6–20)
CO2: 21 mmol/L — ABNORMAL LOW (ref 22–32)
Calcium: 8.9 mg/dL (ref 8.9–10.3)
Chloride: 105 mmol/L (ref 98–111)
Creatinine, Ser: 0.77 mg/dL (ref 0.61–1.24)
GFR, Estimated: >60 mL/min (ref >60)
Glucose, Bld: 202 mg/dL — ABNORMAL HIGH (ref 70–99)
Potassium: 3.8 mmol/L (ref 3.5–5.1)
Sodium: 134 mmol/L — ABNORMAL LOW (ref 135–145)

## 2023-05-15 LAB — MAGNESIUM: Magnesium: 1.7 mg/dL (ref 1.7–2.4)

## 2023-05-15 LAB — CBC
HCT: 44.3 % (ref 39.0–52.0)
Hemoglobin: 15 g/dL (ref 13.0–17.0)
MCH: 31.8 pg (ref 26.0–34.0)
MCHC: 33.9 g/dL (ref 30.0–36.0)
MCV: 93.9 fL (ref 80.0–100.0)
Platelets: 286 10*3/uL (ref 150–400)
RBC: 4.72 MIL/uL (ref 4.22–5.81)
RDW: 12.6 % (ref 11.5–15.5)
WBC: 5.6 10*3/uL (ref 4.0–10.5)
nRBC: 0 % (ref 0.0–0.2)

## 2023-05-15 LAB — TROPONIN I (HIGH SENSITIVITY)
Troponin I (High Sensitivity): 2 ng/L (ref <18)
Troponin I (High Sensitivity): <2 ng/L (ref <18)

## 2023-05-15 MED ORDER — ASPIRIN 81 MG PO CHEW
324.0000 mg | CHEWABLE_TABLET | Freq: Once | ORAL | Status: AC
  Administered 2023-05-15: 324 mg via ORAL
  Filled 2023-05-15: qty 4

## 2023-05-15 MED ORDER — ALUM & MAG HYDROXIDE-SIMETH 200-200-20 MG/5ML PO SUSP
30.0000 mL | Freq: Once | ORAL | Status: AC
  Administered 2023-05-15: 30 mL via ORAL
  Filled 2023-05-15: qty 30

## 2023-05-15 MED ORDER — FAMOTIDINE 20 MG PO TABS
20.0000 mg | ORAL_TABLET | Freq: Once | ORAL | Status: AC
  Administered 2023-05-15: 20 mg via ORAL
  Filled 2023-05-15: qty 1

## 2023-05-15 NOTE — Inpatient Diabetes Management (Signed)
Inpatient Diabetes Program Recommendations  AACE/ADA: New Consensus Statement on Inpatient Glycemic Control (2015)  Target Ranges:  Prepandial:   less than 140 mg/dL      Peak postprandial:   less than 180 mg/dL (1-2 hours)      Critically ill patients:  140 - 180 mg/dL   Lab Results  Component Value Date   GLUCAP 95 05/15/2023   HGBA1C 9.8 (H) 10/11/2021    Review of Glycemic Control  Latest Reference Range & Units 05/15/23 13:06 05/15/23 13:25  Glucose-Capillary 70 - 99 mg/dL 55 (L) 95   Diabetes history: DM 2 Outpatient Diabetes medications: Levemir 40 units qhs, Humalog 8-14 units tid Current orders for Inpatient glycemic control:  None being evaluated int he ED  Inpatient Diabetes Program Recommendations:    If admitted may consider -   Levemir 20 units to start tonight (1/2 home dose) -   Novolog 0-15 units tid + hs  Thanks,  Christena Deem RN, MSN, BC-ADM Inpatient Diabetes Coordinator Team Pager [phone] (8a-5p)

## 2023-05-15 NOTE — ED Notes (Signed)
AVS provided to and discussed with patient and family member at bedside. Pt verbalizes understanding of discharge instructions and denies any questions or concerns at this time. Pt has ride home. Pt ambulated out of department independently with steady gait.  

## 2023-05-15 NOTE — ED Notes (Signed)
Portable Xray at bedside.

## 2023-05-15 NOTE — ED Notes (Signed)
Pt ambulated to BR with steady gait. Denies dizziness, lightheadedness.

## 2023-05-15 NOTE — ED Triage Notes (Signed)
Pt c/o chest pain x 6 days that started as an ache to left side of chest; pt states since then he has had increased pain that is now sharp and radiates to his left shoulder and back  Pt states he took 2 nitroglycerin this am with improvement with the last one taken at 0833  Pt states he has had some sob and diaphoresis with the pain  Pt had cardiac stents placed in 2017

## 2023-05-15 NOTE — ED Notes (Signed)
Dr. Denton Lank at bedside assessing pt.

## 2023-05-15 NOTE — ED Notes (Addendum)
Pt reporting pain started about a week ago while lifting heavy objects. Pt states pain radiates to his chest and L arm. PA Triplett made aware, will reevaluate patient.

## 2023-05-15 NOTE — Discharge Instructions (Signed)
Someone from the cardiology office should contact you to arrange follow-up appointment.  Also recommend that you follow-up with your primary care provider.  Return to the emergency department for any new or worsening symptoms.

## 2023-05-15 NOTE — ED Provider Notes (Signed)
[place] EMERGENCY DEPARTMENT AT [facility] Provider Note   CSN: [id number] Arrival date & time: 05/15/23  7829     History  Chief Complaint  Patient presents with   Chest Pain    Shawn Hughes is a 57 y.o. male.   Chest Pain Associated symptoms: diaphoresis   Associated symptoms: no abdominal pain, no cough, no dizziness, no fever, no headache, no nausea, no numbness, no palpitations, no shortness of breath, no vomiting and no weakness         Shawn Hughes is a 57 y.o. male with past medical history of type 2 diabetes, hyperlipidemia, coronary artery disease with previous cardiac catheterization and stent placement who presents to the Emergency Department complaining of focal left-sided chest pain that began 1 week ago.  States the pain was dull in quality and began after exertion.  Over the past week, states pain has remained constant.  He has had intermittent episodes of sharp pain that began earlier this morning.  He states the pain radiated toward his shoulder.  He took 1 nitroglycerin earlier this morning that changed his sharp chest pain to dull.  He attempted to go to work and had recurrence of sharp pain at 8:33 this morning.  He took another nitroglycerin with resolution of his pain.  Since his ER arrival, he has had recurrence of dull left-sided chest pain.  His pain has been associated with some exertional dyspnea and intermittent diaphoresis. Has not seen his cardiologist in approximately one year.   Last cardiac catheterization was November 2022 echocardiogram from same time showed EF of 55 to 60%   Home Medications Prior to Admission medications   Medication Sig Start Date End Date Taking? Authorizing Provider  aspirin EC 81 MG tablet Take 1 tablet (81 mg total) by mouth daily with breakfast. 09/21/19   Emokpae, Courage, MD  Evolocumab (REPATHA SURECLICK) 140 MG/ML SOAJ Inject 140 mg into the skin every 14 (fourteen) days. 05/07/22   Jake Bathe, MD  insulin detemir (LEVEMIR) 100 UNIT/ML injection Inject 0.4 mLs (40 Units total) into the skin at bedtime. 11/01/21 05/10/23  Roma Kayser, MD  insulin lispro (HUMALOG KWIKPEN) 100 UNIT/ML KwikPen Inject 8-14 Units into the skin 3 (three) times daily before meals. 11/01/21   Roma Kayser, MD  Insulin Pen Needle (PEN NEEDLES 3/16") 31G X 5 MM MISC 5 Units by Does not apply route 3 (three) times daily before meals. 10/13/21   Rai, Ripudeep K, MD  irbesartan (AVAPRO) 150 MG tablet Take 1 tablet (150 mg total) by mouth daily. 03/21/22   Jake Bathe, MD  isosorbide mononitrate (IMDUR) 30 MG 24 hr tablet Take 1 tablet (30 mg total) by mouth daily. 10/14/21   Rai, Delene Ruffini, MD  metoprolol succinate (TOPROL-XL) 50 MG 24 hr tablet Take 1 tablet (50 mg total) by mouth daily. Take with or immediately following a meal. 03/29/22 03/24/23  Jonelle Sidle, MD  nitroGLYCERIN (NITROSTAT) 0.4 MG SL tablet Place 1 tablet (0.4 mg total) under the tongue every 5 (five) minutes as needed for chest pain. 07/26/20   Jonelle Sidle, MD      Allergies    Insulin glargine, Lortab [hydrocodone-acetaminophen], Lipitor [atorvastatin calcium], Hydrocodone, Zocor [simvastatin], and Iodinated contrast media    Review of Systems   Review of Systems  Constitutional:  Positive for diaphoresis. Negative for chills and fever.  Respiratory:  Negative for cough and shortness of breath.   Cardiovascular:  Positive for chest pain. Negative for palpitations and leg swelling.  Gastrointestinal:  Negative for abdominal pain, nausea and vomiting.  Genitourinary:  Negative for dysuria.  Musculoskeletal:  Negative for neck pain.  Neurological:  Negative for dizziness, syncope, weakness, numbness and headaches.    Physical Exam Updated Vital Signs BP (!) 133/92   Pulse 81   Temp 98.2 F (36.8 C) (Oral)   Resp 12   Ht 5\' 9"  (1.753 m)   Wt 83.9 kg   SpO2 98%   BMI 27.32 kg/m  Physical Exam Vitals and  nursing note reviewed.  Constitutional:      General: He is not in acute distress.    Appearance: He is not ill-appearing or toxic-appearing.  HENT:     Mouth/Throat:     Mouth: Mucous membranes are moist.  Cardiovascular:     Rate and Rhythm: Normal rate and regular rhythm.     Pulses: Normal pulses.  Pulmonary:     Effort: Pulmonary effort is normal.     Breath sounds: Normal breath sounds.  Abdominal:     Palpations: Abdomen is soft.     Tenderness: There is no abdominal tenderness.  Musculoskeletal:        General: Normal range of motion.     Right lower leg: No edema.     Left lower leg: No edema.  Skin:    General: Skin is warm.     Capillary Refill: Capillary refill takes less than 2 seconds.  Neurological:     General: No focal deficit present.     Mental Status: He is alert.     Sensory: No sensory deficit.     Motor: No weakness.     ED Results / Procedures / Treatments   Labs (all labs ordered are listed, but only abnormal results are displayed) Labs Reviewed  BASIC METABOLIC PANEL - Abnormal; Notable for the following components:      Result Value   Sodium 134 (*)    CO2 21 (*)    Glucose, Bld 202 (*)    All other components within normal limits  CBG MONITORING, ED - Abnormal; Notable for the following components:   Glucose-Capillary 55 (*)    All other components within normal limits  CBC  MAGNESIUM  CBG MONITORING, ED  TROPONIN I (HIGH SENSITIVITY)  TROPONIN I (HIGH SENSITIVITY)    EKG EKG Interpretation  Date/Time:  Thursday May 15 2023 09:40:25 EDT Ventricular Rate:  87 PR Interval:  113 QRS Duration: 86 QT Interval:  355 QTC Calculation: 427 R Axis:   75 Text Interpretation: Sinus rhythm Non-specific ST-t changes No significant change since last tracing Confirmed by Cathren Laine ([id number]) on 05/15/2023 9:54:41 AM  Radiology DG Chest Portable 1 View  Result Date: 05/15/2023 CLINICAL DATA:  Chest pain for 6 days EXAM: PORTABLE CHEST 1  VIEW COMPARISON:  X-ray 10/08/2022 FINDINGS: The left inferior costophrenic angle is clipped off the edge of the film. No consolidation, pneumothorax or effusion. No edema. Normal cardiopericardial silhouette. Overlapping cardiac leads. IMPRESSION: No acute cardiopulmonary disease. Electronically Signed   By: Karen Kays M.D.   On: 05/15/2023 11:01    Procedures Procedures    Medications Ordered in ED Medications  aspirin chewable tablet 324 mg (has no administration in time range)    ED Course/ Medical Decision Making/ A&P  Medical Decision Making Patient with history of diabetes and coronary artery disease, here with chest pain described as dull for 1 week.  Onset after exertion. Had intermittent sharp pains beginning earlier this morning.  Took 1 nitroglycerin pain resolved from sharp to dull few hours later pain returned as sharp and resolved after a second nitroglycerin.  On presentation to the ER pain again dull to left chest intermittently radiating toward his shoulder.   Differential would include but not limited to ACS, STEMI, musculoskeletal, GERD and PE.  PE felt less likely as patient without tachycardia tachypnea or hypoxia.  No pleuritic chest pain  Had CT angio chest 2022 negative for PE or acute CP process    Amount and/or Complexity of Data Reviewed Labs: ordered.    Details: Labs interpreted by me, no evidence of leukocytosis, hemoglobin unremarkable, chemistries show blood glucose of 202 otherwise unremarkable.  Magnesium reassuring.  Delta troponin unchanged Radiology: ordered.    Details: Chest x-ray without evidence of acute cardiopulmonary disease ECG/medicine tests: ordered.    Details: EKG shows sinus rhythm, nonspecific ST and T wave changes no significant change from last tracing Discussion of management or test interpretation with external provider(s): Patient with 1 week history of left-sided chest pain began after exertion.   Workup here reassuring, flat delta troponin.   Vital signs reviewed, mildly hypertensive.  No sharp pain or worsening symptoms during ER stay.  Patient and brief episode felt his his sugar was dropping.  He was given snack and orange juice blood sugar improved to 95.  Patient also seen by Dr. Denton Lank and care plan discussed.  Patient felt appropriate for discharge home, he has not seen his cardiologist in some time, ambulatory referral will be placed patient agreeable to close outpatient follow-up.  Strict return precautions were discussed.  Risk OTC drugs.           Final Clinical Impression(s) / ED Diagnoses Final diagnoses:  Nonspecific chest pain    Rx / DC Orders ED Discharge Orders     None         Pauline Aus, PA-C 05/15/23 1419    Cathren Laine, MD 05/21/23 [phone]

## 2023-05-15 NOTE — ED Notes (Signed)
Pt called out reporting feeling like blood sugar is low. CBG checked, 55. Given juice, crackers and peanut butter. PA notified.

## 2023-07-02 ENCOUNTER — Other Ambulatory Visit (HOSPITAL_COMMUNITY)
Admission: RE | Admit: 2023-07-02 | Discharge: 2023-07-02 | Disposition: A | Discharge status: Home / Self Care | Payer: BC Managed Care – PPO | Source: Ambulatory Visit | Attending: Student | Admitting: Student

## 2023-07-02 ENCOUNTER — Encounter: Payer: Self-pay | Admitting: Student

## 2023-07-02 ENCOUNTER — Other Ambulatory Visit: Payer: Self-pay | Admitting: Student

## 2023-07-02 ENCOUNTER — Ambulatory Visit: Payer: BC Managed Care – PPO | Attending: Student | Admitting: Student

## 2023-07-02 VITALS — BP 136/84 | HR 88 | Ht 69.0 in | Wt 184.2 lb

## 2023-07-02 DIAGNOSIS — E119 Type 2 diabetes mellitus without complications: Secondary | ICD-10-CM

## 2023-07-02 DIAGNOSIS — R5383 Other fatigue: Secondary | ICD-10-CM | POA: Insufficient documentation

## 2023-07-02 DIAGNOSIS — Z79899 Other long term (current) drug therapy: Secondary | ICD-10-CM

## 2023-07-02 DIAGNOSIS — I10 Essential (primary) hypertension: Secondary | ICD-10-CM | POA: Diagnosis not present

## 2023-07-02 DIAGNOSIS — Z794 Long term (current) use of insulin: Secondary | ICD-10-CM

## 2023-07-02 DIAGNOSIS — I25119 Atherosclerotic heart disease of native coronary artery with unspecified angina pectoris: Secondary | ICD-10-CM | POA: Diagnosis not present

## 2023-07-02 DIAGNOSIS — R079 Chest pain, unspecified: Secondary | ICD-10-CM | POA: Diagnosis not present

## 2023-07-02 DIAGNOSIS — Z789 Other specified health status: Secondary | ICD-10-CM

## 2023-07-02 LAB — CBC
HCT: 47.6 % (ref 39.0–52.0)
Hemoglobin: 16.6 g/dL (ref 13.0–17.0)
MCH: 31.3 pg (ref 26.0–34.0)
MCHC: 34.9 g/dL (ref 30.0–36.0)
MCV: 89.6 fL (ref 80.0–100.0)
Platelets: 292 10*3/uL (ref 150–400)
RBC: 5.31 MIL/uL (ref 4.22–5.81)
RDW: 12.1 % (ref 11.5–15.5)
WBC: 7.1 10*3/uL (ref 4.0–10.5)
nRBC: 0 % (ref 0.0–0.2)

## 2023-07-02 LAB — BASIC METABOLIC PANEL WITH GFR
Anion gap: 9 (ref 5–15)
BUN: 15 mg/dL (ref 6–20)
CO2: 26 mmol/L (ref 22–32)
Calcium: 9.5 mg/dL (ref 8.9–10.3)
Chloride: 98 mmol/L (ref 98–111)
Creatinine, Ser: 0.83 mg/dL (ref 0.61–1.24)
GFR, Estimated: >60 mL/min
Glucose, Bld: 396 mg/dL — ABNORMAL HIGH (ref 70–99)
Potassium: 4.3 mmol/L (ref 3.5–5.1)
Sodium: 133 mmol/L — ABNORMAL LOW (ref 135–145)

## 2023-07-02 LAB — VITAMIN D 25 HYDROXY (VIT D DEFICIENCY, FRACTURES): Vit D, 25-Hydroxy: 18.23 ng/mL — ABNORMAL LOW (ref 30–100)

## 2023-07-02 LAB — TSH: TSH: 0.867 u[IU]/mL (ref 0.350–4.500)

## 2023-07-02 MED ORDER — ISOSORBIDE MONONITRATE ER 30 MG PO TB24: 30.0000 mg | ORAL_TABLET | Freq: Every day | ORAL | 3 refills | Status: DC

## 2023-07-02 MED ORDER — ASPIRIN 81 MG PO TBEC: 81.0000 mg | DELAYED_RELEASE_TABLET | Freq: Every day | ORAL | Status: AC

## 2023-07-02 NOTE — Patient Instructions (Signed)
Medication Instructions:  Your physician has recommended you make the following change in your medication:   Restart Aspirin 81 mg Daily  Restart Imdur 30 mg Daily   *If you need a refill on your cardiac medications before your next appointment, please call your pharmacy*   Lab Work: Your physician recommends that you return for lab work in: Today   If you have labs (blood work) drawn today and your tests are completely normal, you will receive your results only by: MyChart Message (if you have MyChart) OR A paper copy in the mail If you have any lab test that is abnormal or we need to change your treatment, we will call you to review the results.   Testing/Procedures: NONE    Follow-Up: At [facility], you and your health needs are our priority.  As part of our continuing mission to provide you with exceptional heart care, we have created designated Provider Care Teams.  These Care Teams include your primary Cardiologist (physician) and Advanced Practice Providers (APPs -  Physician Assistants and Nurse Practitioners) who all work together to provide you with the care you need, when you need it.  We recommend signing up for the patient portal called "MyChart".  Sign up information is provided on this After Visit Summary.  MyChart is used to connect with patients for Virtual Visits (Telemedicine).  Patients are able to view lab/test results, encounter notes, upcoming appointments, etc.  Non-urgent messages can be sent to your provider as well.   To learn more about what you can do with MyChart, go to [email or web address].    Your next appointment:   3 month(s)  Provider:   You may see Nona Dell, MD or one of the following Advanced Practice Providers on your designated Care Team:   Randall An, PA-C  Jacolyn Reedy, PA-C     Other Instructions Thank you for choosing [facility]!

## 2023-07-02 NOTE — Progress Notes (Signed)
Cardiology Office Note    Date:  07/02/2023  ID:  Shawn Hughes, DOB Mar 30, 1966, MRN [id number] Cardiologist: Nona Dell, MD    History of Present Illness:    Shawn Hughes is a 57 y.o. male with past medical history of CAD (s/p DES to RI in 08/2016, cath in 09/2021 showing small caliber nondominant RCA with 90% stenosis and too small for PCI and he did have moderate ISR in the OM1/RI but not significant by FFR and medical management recommended), HTN, HLD, Type 2 DM and GERD who presents to the office today for follow-up from a recent Emergency Department visit.  He was last examined by Dr. Anne Fu in 02/2022 and denied any recent anginal symptoms at that time. Did report an occasional burning sensation along his left neck and shoulder area. He had been without Repatha and was restarted on this given his prior intolerance to statins. Toprol-XL was titrated to 50 mg daily and he was started on Irbesartan 150 mg daily given elevated BP. He was informed to follow-up in 2 months but has not been evaluated by Cardiology since.  He did present to the ED in 04/2023 for evaluation of left-sided chest pain for the past 6 days with associated dyspnea and diaphoresis. Hs Troponin values were < 2 and EKG showed no diagnostic ST changes. He was discharged home and informed to follow-up with Cardiology as an outpatient.  In talking the patient and his wife today, he reports a variety of issues over the past several months. Says he does have episodes of numbing, tingling and burning along his upper and lower extremities. This is typically worse at rest and improves with activity. His diabetes is being managed by his PCP currently but he was referred to Endocrinology with an appointment later this month. Says he has been without insulin due to his insurance not approving this yet. He plans to follow-up with his PCP later today. He also reports having intermittent episodes of chest discomfort which typically  occur at rest.  These have been occurring for over the past year and have not necessarily increased in frequency or severity. No specific orthopnea, PND or pitting edema. He did stop all of his cardiac medications in the interim as he reports having side effects to these but is unable to recall specifics.  Studies Reviewed:   EKG: EKG is not ordered today. EKG from 05/15/2023 is reviewed and shows NSR, HR 87 with no acute ST changes when compared to prior tracings.    EKG Interpretation Date/Time:    Ventricular Rate:    PR Interval:    QRS Duration:    QT Interval:    QTC Calculation:   R Axis:      Text Interpretation:         Echocardiogram: 09/2021 IMPRESSIONS     1. Left ventricular ejection fraction, by estimation, is 55 to 60%. The  left ventricle has normal function. The left ventricle has no regional  wall motion abnormalities. There is mild left ventricular hypertrophy.  Left ventricular diastolic parameters  were normal.   2. Right ventricular systolic function is normal. The right ventricular  size is normal. Tricuspid regurgitation signal is inadequate for assessing  PA pressure.   3. The mitral valve is normal in structure. Trivial mitral valve  regurgitation. No evidence of mitral stenosis.   4. The aortic valve is tricuspid. Aortic valve regurgitation is not  visualized. No aortic stenosis is present.   5. The inferior  vena cava is normal in size with greater than 50%  respiratory variability, suggesting right atrial pressure of 3 mmHg.    Cardiac Catheterization: 09/2021  Prox LAD to Mid LAD lesion is 20% stenosed with 20% stenosed side branch in 2nd Diag.   LPAV lesion is 55% stenosed with 55% stenosed side branch in 1st LPL. ->  RFR not significant (1.0)   1st Mrg stent is~ 60% stenosed.  RFR 0.91, 0.92 with FFR of 0.83.  Not significant   Very small caliber, nondominant prox RCA lesion is 90% stenosed.   LV end diastolic pressure is low.   There is no  aortic valve stenosis.   SUMMARY Likely culprit lesion is very small caliber nondominant RCA with proximal 90% stenosis.  Too small for PCI Moderate in-stent restenosis in OM1/RI -> RFR 0.91-0.92.  R0.83.  NOT SIGNIFICANT Moderate distal LCx 50 to 60% prior to propagation into LPL 1 and PDA -> RFR 1.  NOT SIGNIFICANT. With systolic blood pressures in the 90s to low 100s, LVEDP low.    Physical Exam:   VS:  BP 136/84   Pulse 88   Ht 5\' 9"  (1.753 m)   Wt 184 lb 3.2 oz (83.6 kg)   SpO2 97%   BMI 27.20 kg/m    Wt Readings from Last 3 Encounters:  07/02/23 184 lb 3.2 oz (83.6 kg)  05/15/23 185 lb (83.9 kg)  10/08/22 194 lb (88 kg)     GEN: Well nourished, well developed male appearing in no acute distress NECK: No JVD; No carotid bruits CARDIAC: RRR, no murmurs, rubs, gallops RESPIRATORY:  Clear to auscultation without rales, wheezing or rhonchi  ABDOMEN: Appears non-distended. No obvious abdominal masses. EXTREMITIES: No clubbing or cyanosis. No pitting edema.  Distal pedal pulses are 2+ bilaterally.   Assessment and Plan:   1. CAD/Chest Pain with Atypical Features/Fatigue - He underwent DES to RI in 08/2016 and most recent catheterization in 09/2021 showed a small caliber nondominant RCA with 90% stenosis and too small for PCI and he did have moderate ISR in the OM1/RI but not significant by FFR and medical management was recommended.  - He reports having intermittent chest pain for over the past year which has not necessarily increased in frequency or severity. Medical management was previously recommended and he has stopped all medical therapy in the interim. He is open to gradually adjusting medications but does not want to be restarted on his prior regimen all at once which is certainly understandable. Will plan to restart ASA 81 mg daily and Imdur 30 mg daily at this time. If his symptoms persist despite adjustment of medical therapy and compliance with this, could consider a  repeat cardiac catheterization. Will recheck labs today including CBC, BMET, TSH and Vitamin D (at his request) given recent fatigue.   2. HTN - Blood pressure is at 136/84 during today's visit. He was previously on Irbesartan, Imdur and Toprol-XL but self discontinued all medications in the interim. Will plan to restart Imdur 30 mg daily as discussed above.  3. HLD - He was previously intolerant to statins. We discussed PCSK9 inhibitor therapy as he was previously approved for Repatha last year but he never started the injections. He again is not interested in starting at this time and would review at follow-up. We will request a copy of most recent labs from his PCP.  4. Type 2 DM - He reports his glucose has been elevated to 300 - 500 when checked at  home. I encouraged him to follow-up with his PCP in regards to his Insulin. He does have follow-up with Endocrinology later this month.   Signed, Ellsworth Lennox, PA-C

## 2023-07-25 ENCOUNTER — Encounter: Payer: Self-pay | Admitting: "Endocrinology

## 2023-07-25 ENCOUNTER — Ambulatory Visit (INDEPENDENT_AMBULATORY_CARE_PROVIDER_SITE_OTHER): Payer: BC Managed Care – PPO | Admitting: "Endocrinology

## 2023-07-25 VITALS — BP 134/76 | HR 64 | Ht 69.0 in | Wt 181.4 lb

## 2023-07-25 DIAGNOSIS — E1159 Type 2 diabetes mellitus with other circulatory complications: Secondary | ICD-10-CM

## 2023-07-25 DIAGNOSIS — Z794 Long term (current) use of insulin: Secondary | ICD-10-CM | POA: Insufficient documentation

## 2023-07-25 DIAGNOSIS — E782 Mixed hyperlipidemia: Secondary | ICD-10-CM

## 2023-07-25 DIAGNOSIS — E559 Vitamin D deficiency, unspecified: Secondary | ICD-10-CM | POA: Diagnosis not present

## 2023-07-25 DIAGNOSIS — I10 Essential (primary) hypertension: Secondary | ICD-10-CM

## 2023-07-25 LAB — POCT GLYCOSYLATED HEMOGLOBIN (HGB A1C): HbA1c, POC (controlled diabetic range): 13.1 % — AB (ref 0.0–7.0)

## 2023-07-25 MED ORDER — TRESIBA FLEXTOUCH 100 UNIT/ML ~~LOC~~ SOPN: 20.0000 [IU] | PEN_INJECTOR | Freq: Every day | SUBCUTANEOUS | 1 refills | Status: DC

## 2023-07-25 MED ORDER — CLOTRIMAZOLE-BETAMETHASONE 1-0.05 % EX CREA: 1.0000 | TOPICAL_CREAM | Freq: Every day | CUTANEOUS | 0 refills | Status: AC

## 2023-07-25 MED ORDER — EMPAGLIFLOZIN 10 MG PO TABS: 10.0000 mg | ORAL_TABLET | Freq: Every day | ORAL | 1 refills | Status: DC

## 2023-07-25 MED ORDER — BD PEN NEEDLE NANO U/F 32G X 4 MM MISC: 1.0000 | Freq: Four times a day (QID) | 1 refills | Status: DC

## 2023-07-25 MED ORDER — VITAMIN D (ERGOCALCIFEROL) 1.25 MG (50000 UNIT) PO CAPS: 50000.0000 [IU] | ORAL_CAPSULE | ORAL | 0 refills | Status: DC

## 2023-07-25 NOTE — Progress Notes (Signed)
07/25/2023, 11:57 AM   Endocrinology follow-up note  Subjective:    Patient ID: Shawn Hughes, male    DOB: 1966-03-24.  Shawn Hughes is being seen in follow-up after he was seen in consultation for management of currently uncontrolled symptomatic diabetes requested by  [facility].  He missed his follow-up appointment after he was seen in December 2022. Past Medical History:  Diagnosis Date   CAD (coronary artery disease)    a. 08/2016: cath showing 40% prox RCA, 10% LCx, 20% LAD and 99% RI stenosis. DES to RI.   COVID-14 October 2019 - was not hospitalized   Essential hypertension    GERD (gastroesophageal reflux disease)    History of nephrolithiasis    Mixed hyperlipidemia    Type 2 diabetes mellitus (HCC)     Past Surgical History:  Procedure Laterality Date   CARDIAC CATHETERIZATION N/A 08/30/2016   Procedure: Left Heart Cath and Coronary Angiography;  Surgeon: Kathleene Hazel, MD;  Location: [facility] INVASIVE CV LAB;  Service: Cardiovascular;  Laterality: N/A;   CARDIAC CATHETERIZATION N/A 08/30/2016   Procedure: Coronary Stent Intervention;  Surgeon: Kathleene Hazel, MD;  Location: [facility] INVASIVE CV LAB;  Service: Cardiovascular; 90% prox RI - > DES PCI Promus Premier 2.5 x 16 (2.75 mm)   CORONARY PRESSURE/FFR STUDY N/A 10/12/2021   Procedure: INTRAVASCULAR PRESSURE WIRE/FFR STUDY;  Surgeon: Marykay Lex, MD;  Location: [facility] INVASIVE CV LAB;  Service: Cardiovascular;  Laterality: N/A;   ESOPHAGOGASTRODUODENOSCOPY  01/10/2012   JXB:JYNWGNFAO in the distal esophagus2O TO GERD/Mild gastritis   LACERATION REPAIR     Right arm   LEFT HEART CATH AND CORS/GRAFTS ANGIOGRAPHY N/A 10/12/2021   Procedure: LEFT HEART CATH AND CORS/GRAFTS ANGIOGRAPHY;  Surgeon: Marykay Lex, MD;  Location: [facility] INVASIVE CV LAB;  Service: Cardiovascular;  Laterality: N/A;     Social History   Socioeconomic History   Marital status: Married    Spouse name: Not on file   Number of children: Not on file   Years of education: Not on file   Highest education level: Not on file  Occupational History   Not on file  Tobacco Use   Smoking status: Former    Current packs/day: 0.00    Types: Cigarettes    Start date: 09/11/1983    Quit date: 08/30/2016    Years since quitting: 6.9   Smokeless tobacco: Never  Vaping Use   Vaping status: Never Used  Substance and Sexual Activity   Alcohol use: Yes    Comment: 16-32oz daily   Drug use: No   Sexual activity: Yes  Other Topics Concern   Not on file  Social History Narrative   Not on file   Social Determinants of Health   Financial Resource Strain: Not on file  Food Insecurity: Not on file  Transportation Needs: Not on file  Physical Activity: Not on file  Stress: Not on file  Social Connections: Not on file    Family History  Problem Relation Age of Onset   Hypertension Mother    CAD  Mother 53   Heart failure Mother    Heart failure Father    Hypertension Father    Diabetes Father    CAD Father 55   Hyperlipidemia Father    Hypertension Brother    Colon cancer Neg Hx    Colon polyps Neg Hx    Esophageal cancer Neg Hx    Pancreatic cancer Neg Hx    Stomach cancer Neg Hx     Outpatient Encounter Medications as of 07/25/2023  Medication Sig   clotrimazole-betamethasone (LOTRISONE) cream Apply 1 Application topically daily. Apply to affected area twice daily   empagliflozin (JARDIANCE) 10 MG TABS tablet Take 1 tablet (10 mg total) by mouth daily before breakfast.   insulin degludec (TRESIBA FLEXTOUCH) 100 UNIT/ML FlexTouch Pen Inject 20 Units into the skin at bedtime.   Insulin Pen Needle (BD PEN NEEDLE NANO U/F) 32G X 4 MM MISC 1 each by Does not apply route 4 (four) times daily.   Vitamin D, Ergocalciferol, (DRISDOL) 1.25 MG (50000 UNIT) CAPS capsule Take 1 capsule (50,000 Units total) by  mouth every 7 (seven) days.   aspirin EC 81 MG tablet Take 1 tablet (81 mg total) by mouth daily. Swallow whole.   isosorbide mononitrate (IMDUR) 30 MG 24 hr tablet Take 1 tablet (30 mg total) by mouth daily.   nitroGLYCERIN (NITROSTAT) 0.4 MG SL tablet Place 1 tablet (0.4 mg total) under the tongue every 5 (five) minutes as needed for chest pain.   No facility-administered encounter medications on file as of 07/25/2023.    ALLERGIES: Allergies  Allergen Reactions   Insulin Glargine Anaphylaxis    Anaphylaxis    Lortab [Hydrocodone-Acetaminophen] Itching   Lipitor [Atorvastatin Calcium] Other (See Comments)    Extreme muscle weakness, passing out, generalized pain all through body   Hydrocodone Itching   Zocor [Simvastatin]     MYALGIAS   Iodinated Contrast Media Itching    Pt given contrast and began itching after injection.     VACCINATION STATUS: There is no immunization history for the selected administration types on file for this patient.  Diabetes He presents for his follow-up diabetic visit. He has type 2 diabetes mellitus. Onset time: He was diagnosed at approximate age of 47 years. His disease course has been worsening. There are no hypoglycemic associated symptoms. Pertinent negatives for hypoglycemia include no confusion, headaches, pallor or seizures. Associated symptoms include blurred vision, polydipsia and polyuria. Pertinent negatives for diabetes include no chest pain, no fatigue, no polyphagia and no weakness. There are no hypoglycemic complications. Symptoms are worsening. Diabetic complications include heart disease. Risk factors for coronary artery disease include dyslipidemia, diabetes mellitus, hypertension, male sex and tobacco exposure. Current diabetic treatment includes insulin injections (Patient is not on any treatment currently, for several months.  His point-of-care A1c was 13.1%.  He presents with a page of blood glucose readings averaging greater than 350  mg per DL.). His weight is fluctuating minimally. He is following a generally unhealthy diet. When asked about meal planning, he reported none. He has not had a previous visit with a dietitian. He participates in exercise intermittently. His home blood glucose trend is increasing steadily. His breakfast blood glucose range is generally >200 mg/dl. His lunch blood glucose range is generally >200 mg/dl. His dinner blood glucose range is generally >200 mg/dl. His bedtime blood glucose range is generally >200 mg/dl. His overall blood glucose range is >200 mg/dl. (He brought a page of blood glucose logs averaging greater than 350 mg/day.  He also brought his meter, verified most of his readings.  His point-of-care A1c is 13.1%.  He has not taking any medication for several months.  He missed his appointment since December 2022.   He does have a history of coronary artery disease status post stent placement in 2017.  He returns to clinic only because of neuropathic pain.) An ACE inhibitor/angiotensin II receptor blocker is not being taken.  Hyperlipidemia This is a chronic problem. The current episode started more than 1 year ago. The problem is uncontrolled. Exacerbating diseases include diabetes. Pertinent negatives include no chest pain, myalgias or shortness of breath. He is currently on no antihyperlipidemic treatment (He does have history of statin intolerance.). Risk factors for coronary artery disease include dyslipidemia, diabetes mellitus, family history, hypertension and male sex.  Hypertension This is a chronic problem. The current episode started more than 1 year ago. Associated symptoms include blurred vision. Pertinent negatives include no chest pain, headaches, neck pain, palpitations or shortness of breath. Risk factors for coronary artery disease include dyslipidemia, diabetes mellitus, male gender and smoking/tobacco exposure. Past treatments include direct vasodilators and beta blockers.  Hypertensive end-organ damage includes CAD/MI.     Review of Systems  Constitutional:  Negative for chills, fatigue, fever and unexpected weight change.  HENT:  Negative for dental problem, mouth sores and trouble swallowing.   Eyes:  Positive for blurred vision. Negative for visual disturbance.  Respiratory:  Negative for cough, choking, chest tightness, shortness of breath and wheezing.   Cardiovascular:  Negative for chest pain, palpitations and leg swelling.  Gastrointestinal:  Negative for abdominal distention, abdominal pain, constipation, diarrhea, nausea and vomiting.  Endocrine: Positive for polydipsia and polyuria. Negative for polyphagia.  Genitourinary:  Negative for dysuria, flank pain, hematuria and urgency.  Musculoskeletal:  Negative for back pain, gait problem, myalgias and neck pain.  Skin:  Negative for pallor, rash and wound.  Neurological:  Negative for seizures, syncope, weakness, numbness and headaches.  Psychiatric/Behavioral:  Negative for confusion and dysphoric mood.     Objective:       07/25/2023    9:08 AM 07/02/2023    1:16 PM 05/15/2023    1:30 PM  Vitals with BMI  Height 5\' 9"  5\' 9"    Weight 181 lbs 6 oz 184 lbs 3 oz   BMI 26.78 27.19   Systolic 134 136 161  Diastolic 76 84 93  Pulse 64 88 84    BP 134/76   Pulse 64   Ht 5\' 9"  (1.753 m)   Wt 181 lb 6.4 oz (82.3 kg)   BMI 26.79 kg/m   Wt Readings from Last 3 Encounters:  07/25/23 181 lb 6.4 oz (82.3 kg)  07/02/23 184 lb 3.2 oz (83.6 kg)  05/15/23 185 lb (83.9 kg)     CMP ( most recent) CMP     Component Value Date/Time   NA 133 (L) 07/02/2023 1426   K 4.3 07/02/2023 1426   CL 98 07/02/2023 1426   CO2 26 07/02/2023 1426   GLUCOSE 396 (H) 07/02/2023 1426   BUN 15 07/02/2023 1426   CREATININE 0.83 07/02/2023 1426   CALCIUM 9.5 07/02/2023 1426   PROT 7.1 10/08/2022 1211   ALBUMIN 4.3 10/08/2022 1211   AST 17 10/08/2022 1211   ALT 21 10/08/2022 1211   ALKPHOS 74 10/08/2022 1211    BILITOT 0.7 10/08/2022 1211   GFRNONAA >60 07/02/2023 1426   GFRAA >60 09/21/2019 0555   Diabetic Labs (most recent): Lab Results  Component Value Date   HGBA1C 13.1 (A) 07/25/2023   HGBA1C 9.8 (H) 10/11/2021   HGBA1C 13.9 (H) 12/14/2016     Lipid Panel     Component Value Date/Time   CHOL 250 (H) 04/23/2022 1144   TRIG 214 (H) 04/23/2022 1144   HDL 38 (L) 04/23/2022 1144   CHOLHDL 6.6 (H) 04/23/2022 1144   CHOLHDL 8.0 09/21/2019 0555   VLDL 50 (H) 09/21/2019 0555   LDLCALC 172 (H) 04/23/2022 1144   LABVLDL 40 04/23/2022 1144      Lab Results  Component Value Date   TSH 0.867 07/02/2023   TSH 1.711 10/12/2021   TSH 1.925 09/21/2019     Assessment & Plan:   1. DM type 2 causing vascular disease (HCC)   - Shawn Hughes has currently uncontrolled symptomatic type 2 DM since  57 years of age.  He did not bring any logs nor meter with him.  He reports that his blood glucose readings are mostly above 200 mg per DL.  His recent A1c was 9.8% when he was hospitalized for chest pain.  He does have a history of coronary artery disease status post stent placement in 2017.  - I had a long discussion with him about the progressive nature of diabetes and the pathology behind its complications. -his diabetes is complicated by coronary artery disease which needed stent placement in 2017, chronic, severe hyperglycemic burden, comorbid hyperlipidemia with statin intolerance, hypertension and he remains at extremely high risk for more acute and chronic complications which include CAD, CVA, CKD, retinopathy, and neuropathy. These are all discussed in detail with him.  - I discussed all available options of managing his diabetes including de-escalation of medications. I have counseled him on diet  and weight management  by adopting a Whole Food , Plant Predominant  ( WFPP) nutrition as recommended by [employer] of Lifestyle Medicine. Patient is encouraged to switch to  unprocessed or  minimally processed  complex starch, adequate protein intake (mainly plant source), minimal liquid fat ( mainly vegetable oils), plenty of fruits, and vegetables. -  he is advised to stick to a routine mealtimes to eat 3 complete meals a day and snack only when necessary ( to snack only to correct hypoglycemia BG <70 day time or <100 at night).   - he acknowledges that there is a room for improvement in his food and drink choices. - Further Specific Suggestion is made for him to avoid simple carbohydrates  from his diet including Cakes, Sweet Desserts, Ice Cream, Soda (diet and regular), Sweet Tea, Candies, Chips, Cookies, Store Bought Juices, Alcohol in Excess of  1-2 drinks a day, Artificial Sweeteners,  Coffee Creamer, and "Sugar-free" Products. This will help patient to have more stable blood glucose profile and potentially avoid unintended weight gain.  - he will be scheduled with Norm Salt, RDN, CDE for individualized diabetes education.  - I have approached him with the following plan to manage  his diabetes and patient agrees:   -In light of his presentation with severe, persistent hyperglycemia, he will need multiple modality treatment in order for him to achieve control of diabetes to target.    -After his commitment is assured, he will be considered for basal/bolus insulin. - In preparation, I discussed and initiated Tresiba 20 units nightly, approached for monitoring blood glucose 4 times a day-before meals and at bedtime and return with his logs and meter in about 3 weeks.   -I also discussed and added  Jardiance 10 mg p.o. daily at breakfast. - he is encouraged to call clinic for blood glucose levels less than 70 or above 200 mg /dl. -Patient will benefit from a CGM.  Will send a prescription for freestyle libre device for him.    -He will be considered for other additional options as appropriate on subsequent visits. - Specific targets for  A1c;  LDL, HDL,  and Triglycerides  were discussed with the patient.  2) Blood Pressure /Hypertension: His blood pressure is controlled to target.    he is advised to continue his current medications including metoprolol 25 mg p.o. once a day, isosorbide 30 mg p.o. twice daily.  He reports intolerance to lisinopril.   3) Lipids/Hyperlipidemia: His recent lipid panel showed uncontrolled LDL at 173.    He reports significant side effects from statins.  The severe dyslipidemia puts him at additional risk of coronary disease.  This makes it even more important for him to adopt plant-based nutrition discussed above.  This patient needs to be reapproached for the option.   He would also be considered for Repatha injection given high LDL.   4)  Weight/Diet:  Body mass index is 26.79 kg/m.  -   clearly complicating his diabetes care.   he is  a candidate for weight loss. I discussed with him the fact that loss of 5 - 10% of his  current body weight will have the most impact on his diabetes management.  The above detailed WFPP Nutrition will help manage weight as well. Optimal Exercise, Restorative Sleep  information was detailed on discharge instructions.  5) Chronic Care/Health Maintenance:  -he  is not on ACEI/ARB and Statin medications and  is encouraged to initiate and continue to follow up with Ophthalmology, Dentist,  Podiatrist at least yearly or according to recommendations, and advised to   stay away from smoking. I have recommended yearly flu vaccine and pneumonia vaccine at least every 5 years; moderate intensity exercise for up to 150 minutes weekly; and  sleep for 7- 9 hours a day.  - he is  advised to maintain close follow up with [facility] for primary care needs, as well as his other providers for optimal and coordinated care.   I spent  41  minutes in the care of the patient today including review of labs from CMP, Lipids, Thyroid Function, Hematology (current and previous including abstractions from other  facilities); face-to-face time discussing  his blood glucose readings/logs, discussing hypoglycemia and hyperglycemia episodes and symptoms, medications doses, his options of short and long term treatment based on the latest standards of care / guidelines;  discussion about incorporating lifestyle medicine;  and documenting the encounter. Risk reduction counseling performed per USPSTF guidelines to reduce cardiovascular risk factors.     Please refer to Patient Instructions for Blood Glucose Monitoring and Insulin/Medications Dosing Guide"  in media tab for additional information. Please  also refer to " Patient Self Inventory" in the Media  tab for reviewed elements of pertinent patient history.  Waldron D Bodmer participated in the discussions, expressed understanding, and voiced agreement with the above plans.  All questions were answered to his satisfaction. he is encouraged to contact clinic should he have any questions or concerns prior to his return visit.   Follow up plan: - Return in about 3 weeks (around 08/15/2023) for F/U with Meter/CGM /Logs Only - no Labs.  Marquis Lunch, MD [facility] [address] [place],  [place] [address] Phone: [phone]  Fax: [phone]    07/25/2023, 11:57 AM  This note was partially dictated with voice recognition software. Similar sounding words can be transcribed inadequately or may not  be corrected upon review.

## 2023-07-25 NOTE — Patient Instructions (Signed)
                                     Advice for Weight Management  -For most of us the best way to lose weight is by diet management. Generally speaking, diet management means consuming less calories intentionally which over time brings about progressive weight loss.  This can be achieved more effectively by avoiding ultra processed carbohydrates, processed meats, unhealthy fats.    It is critically important to know your numbers: how much calorie you are consuming and how much calorie you need. More importantly, our carbohydrates sources should be unprocessed naturally occurring  complex starch food items.  It is always important to balance nutrition also by  appropriate intake of proteins (mainly plant-based), healthy fats/oils, plenty of fruits and vegetables.   -The American College of Lifestyle Medicine (ACL M) recommends nutrition derived mostly from Whole Food, Plant Predominant Sources example an apple instead of applesauce or apple pie. Eat Plenty of vegetables, Mushrooms, fruits, Legumes, Whole Grains, Nuts, seeds in lieu of processed meats, processed snacks/pastries red meat, poultry, eggs.  Use only water or unsweetened tea for hydration.  The College also recommends the need to stay away from risky substances including alcohol, smoking; obtaining 7-9 hours of restorative sleep, at least 150 minutes of moderate intensity exercise weekly, importance of healthy social connections, and being mindful of stress and seek help when it is overwhelming.    -Sticking to a routine mealtime to eat 3 meals a day and avoiding unnecessary snacks is shown to have a big role in weight control. Under normal circumstances, the only time we burn stored energy is when we are hungry, so allow  some hunger to take place- hunger means no food between appropriate meal times, only water.  It is not advisable to starve.   -It is better to avoid simple carbohydrates including:  Cakes, Sweet Desserts, Ice Cream, Soda (diet and regular), Sweet Tea, Candies, Chips, Cookies, Store Bought Juices, Alcohol in Excess of  1-2 drinks a day, Lemonade,  Artificial Sweeteners, Doughnuts, Coffee Creamers, "Sugar-free" Products, etc, etc.  This is not a complete list.....    -Consulting with certified diabetes educators is proven to provide you with the most accurate and current information on diet.  Also, you may be  interested in discussing diet options/exchanges , we can schedule a visit with Shawn Hughes, RDN, CDE for individualized nutrition education.  -Exercise: If you are able: 30 -60 minutes a day ,4 days a week, or 150 minutes of moderate intensity exercise weekly.    The longer the better if tolerated.  Combine stretch, strength, and aerobic activities.  If you were told in the past that you have high risk for cardiovascular diseases, or if you are currently symptomatic, you may seek evaluation by your heart doctor prior to initiating moderate to intense exercise programs.                                  Additional Care Considerations for Diabetes/Prediabetes   -Diabetes  is a chronic disease.  The most important care consideration is regular follow-up with your diabetes care provider with the goal being avoiding or delaying its complications and to take advantage of advances in medications and technology.  If appropriate actions are taken early enough, type 2 diabetes can even be   reversed.  Seek information from the right source.  - Whole Food, Plant Predominant Nutrition is highly recommended: Eat Plenty of vegetables, Mushrooms, fruits, Legumes, Whole Grains, Nuts, seeds in lieu of processed meats, processed snacks/pastries red meat, poultry, eggs as recommended by American College of  Lifestyle Medicine (ACLM).  -Type 2 diabetes is known to coexist with other important comorbidities such as high blood pressure and high cholesterol.  It is critical to control not only the  diabetes but also the high blood pressure and high cholesterol to minimize and delay the risk of complications including coronary artery disease, stroke, amputations, blindness, etc.  The good news is that this diet recommendation for type 2 diabetes is also very helpful for managing high cholesterol and high blood blood pressure.  - Studies showed that people with diabetes will benefit from a class of medications known as ACE inhibitors and statins.  Unless there are specific reasons not to be on these medications, the standard of care is to consider getting one from these groups of medications at an optimal doses.  These medications are generally considered safe and proven to help protect the heart and the kidneys.    - People with diabetes are encouraged to initiate and maintain regular follow-up with eye doctors, foot doctors, dentists , and if necessary heart and kidney doctors.     - It is highly recommended that people with diabetes quit smoking or stay away from smoking, and get yearly  flu vaccine and pneumonia vaccine at least every 5 years.  See above for additional recommendations on exercise, sleep, stress management , and healthy social connections.      

## 2023-07-30 ENCOUNTER — Other Ambulatory Visit: Payer: Self-pay

## 2023-07-30 DIAGNOSIS — E1159 Type 2 diabetes mellitus with other circulatory complications: Secondary | ICD-10-CM

## 2023-07-30 MED ORDER — TRESIBA FLEXTOUCH 100 UNIT/ML ~~LOC~~ SOPN: 20.0000 [IU] | PEN_INJECTOR | Freq: Every day | SUBCUTANEOUS | 1 refills | Status: DC

## 2023-07-30 MED ORDER — EMPAGLIFLOZIN 10 MG PO TABS
10.0000 mg | ORAL_TABLET | Freq: Every day | ORAL | 1 refills | Status: DC
Start: 2023-07-30 — End: 2023-08-18

## 2023-08-06 ENCOUNTER — Telehealth: Payer: Self-pay

## 2023-08-06 ENCOUNTER — Other Ambulatory Visit (HOSPITAL_COMMUNITY): Payer: Self-pay

## 2023-08-06 NOTE — Telephone Encounter (Signed)
Pt's wife made aware. 

## 2023-08-06 NOTE — Telephone Encounter (Signed)
Pt called stating his insurance is requiring a PA for his Rx of jardiance 10mg .

## 2023-08-06 NOTE — Telephone Encounter (Signed)
Pharmacy Patient Advocate Encounter   Received notification from Pt Calls Messages that prior authorization for Jardiance is required/requested.   Per test claim: PA required; PA submitted to [employer] via CoverMyMeds Key/confirmation #/EOC [id number] Status is pending   Status is APPROVED through 08/05/2024  PA#: [id number]

## 2023-08-18 ENCOUNTER — Ambulatory Visit (INDEPENDENT_AMBULATORY_CARE_PROVIDER_SITE_OTHER): Payer: BC Managed Care – PPO | Admitting: "Endocrinology

## 2023-08-18 ENCOUNTER — Encounter: Payer: Self-pay | Admitting: "Endocrinology

## 2023-08-18 VITALS — BP 122/80 | HR 80 | Ht 69.0 in | Wt 184.6 lb

## 2023-08-18 DIAGNOSIS — E1159 Type 2 diabetes mellitus with other circulatory complications: Secondary | ICD-10-CM

## 2023-08-18 DIAGNOSIS — Z789 Other specified health status: Secondary | ICD-10-CM

## 2023-08-18 DIAGNOSIS — E559 Vitamin D deficiency, unspecified: Secondary | ICD-10-CM | POA: Diagnosis not present

## 2023-08-18 DIAGNOSIS — I10 Essential (primary) hypertension: Secondary | ICD-10-CM

## 2023-08-18 DIAGNOSIS — Z794 Long term (current) use of insulin: Secondary | ICD-10-CM

## 2023-08-18 DIAGNOSIS — E782 Mixed hyperlipidemia: Secondary | ICD-10-CM

## 2023-08-18 MED ORDER — TRESIBA FLEXTOUCH 100 UNIT/ML ~~LOC~~ SOPN
30.0000 [IU] | PEN_INJECTOR | Freq: Every day | SUBCUTANEOUS | 1 refills | Status: DC
Start: 2023-08-18 — End: 2023-10-07

## 2023-08-18 MED ORDER — FREESTYLE LIBRE 3 READER DEVI: 1.0000 | Freq: Once | 0 refills | Status: DC | PRN

## 2023-08-18 MED ORDER — FREESTYLE LIBRE 3 SENSOR MISC: 1.0000 | 2 refills | Status: DC

## 2023-08-18 MED ORDER — EMPAGLIFLOZIN 25 MG PO TABS
25.0000 mg | ORAL_TABLET | Freq: Every day | ORAL | 1 refills | Status: DC
Start: 2023-08-18 — End: 2024-02-11

## 2023-08-18 NOTE — Patient Instructions (Signed)
                                     Advice for Weight Management  -For most of us the best way to lose weight is by diet management. Generally speaking, diet management means consuming less calories intentionally which over time brings about progressive weight loss.  This can be achieved more effectively by avoiding ultra processed carbohydrates, processed meats, unhealthy fats.    It is critically important to know your numbers: how much calorie you are consuming and how much calorie you need. More importantly, our carbohydrates sources should be unprocessed naturally occurring  complex starch food items.  It is always important to balance nutrition also by  appropriate intake of proteins (mainly plant-based), healthy fats/oils, plenty of fruits and vegetables.   -The American College of Lifestyle Medicine (ACL M) recommends nutrition derived mostly from Whole Food, Plant Predominant Sources example an apple instead of applesauce or apple pie. Eat Plenty of vegetables, Mushrooms, fruits, Legumes, Whole Grains, Nuts, seeds in lieu of processed meats, processed snacks/pastries red meat, poultry, eggs.  Use only water or unsweetened tea for hydration.  The College also recommends the need to stay away from risky substances including alcohol, smoking; obtaining 7-9 hours of restorative sleep, at least 150 minutes of moderate intensity exercise weekly, importance of healthy social connections, and being mindful of stress and seek help when it is overwhelming.    -Sticking to a routine mealtime to eat 3 meals a day and avoiding unnecessary snacks is shown to have a big role in weight control. Under normal circumstances, the only time we burn stored energy is when we are hungry, so allow  some hunger to take place- hunger means no food between appropriate meal times, only water.  It is not advisable to starve.   -It is better to avoid simple carbohydrates including:  Cakes, Sweet Desserts, Ice Cream, Soda (diet and regular), Sweet Tea, Candies, Chips, Cookies, Store Bought Juices, Alcohol in Excess of  1-2 drinks a day, Lemonade,  Artificial Sweeteners, Doughnuts, Coffee Creamers, "Sugar-free" Products, etc, etc.  This is not a complete list.....    -Consulting with certified diabetes educators is proven to provide you with the most accurate and current information on diet.  Also, you may be  interested in discussing diet options/exchanges , we can schedule a visit with Shawn Hughes, RDN, CDE for individualized nutrition education.  -Exercise: If you are able: 30 -60 minutes a day ,4 days a week, or 150 minutes of moderate intensity exercise weekly.    The longer the better if tolerated.  Combine stretch, strength, and aerobic activities.  If you were told in the past that you have high risk for cardiovascular diseases, or if you are currently symptomatic, you may seek evaluation by your heart doctor prior to initiating moderate to intense exercise programs.                                  Additional Care Considerations for Diabetes/Prediabetes   -Diabetes  is a chronic disease.  The most important care consideration is regular follow-up with your diabetes care provider with the goal being avoiding or delaying its complications and to take advantage of advances in medications and technology.  If appropriate actions are taken early enough, type 2 diabetes can even be   reversed.  Seek information from the right source.  - Whole Food, Plant Predominant Nutrition is highly recommended: Eat Plenty of vegetables, Mushrooms, fruits, Legumes, Whole Grains, Nuts, seeds in lieu of processed meats, processed snacks/pastries red meat, poultry, eggs as recommended by American College of  Lifestyle Medicine (ACLM).  -Type 2 diabetes is known to coexist with other important comorbidities such as high blood pressure and high cholesterol.  It is critical to control not only the  diabetes but also the high blood pressure and high cholesterol to minimize and delay the risk of complications including coronary artery disease, stroke, amputations, blindness, etc.  The good news is that this diet recommendation for type 2 diabetes is also very helpful for managing high cholesterol and high blood blood pressure.  - Studies showed that people with diabetes will benefit from a class of medications known as ACE inhibitors and statins.  Unless there are specific reasons not to be on these medications, the standard of care is to consider getting one from these groups of medications at an optimal doses.  These medications are generally considered safe and proven to help protect the heart and the kidneys.    - People with diabetes are encouraged to initiate and maintain regular follow-up with eye doctors, foot doctors, dentists , and if necessary heart and kidney doctors.     - It is highly recommended that people with diabetes quit smoking or stay away from smoking, and get yearly  flu vaccine and pneumonia vaccine at least every 5 years.  See above for additional recommendations on exercise, sleep, stress management , and healthy social connections.      

## 2023-08-18 NOTE — Progress Notes (Signed)
08/18/2023, 11:06 AM   Endocrinology follow-up note  Subjective:    Patient ID: Shawn Hughes, male    DOB: 12-01-1965.  Shawn Hughes is being seen in follow-up after he was seen in consultation for management of currently uncontrolled symptomatic diabetes .   Past Medical History:  Diagnosis Date   CAD (coronary artery disease)    a. 08/2016: cath showing 40% prox RCA, 10% LCx, 20% LAD and 99% RI stenosis. DES to RI.   COVID-14 October 2019 - was not hospitalized   Essential hypertension    GERD (gastroesophageal reflux disease)    History of nephrolithiasis    Mixed hyperlipidemia    Type 2 diabetes mellitus (HCC)     Past Surgical History:  Procedure Laterality Date   CARDIAC CATHETERIZATION N/A 08/30/2016   Procedure: Left Heart Cath and Coronary Angiography;  Surgeon: Kathleene Hazel, MD;  Location: [facility] INVASIVE CV LAB;  Service: Cardiovascular;  Laterality: N/A;   CARDIAC CATHETERIZATION N/A 08/30/2016   Procedure: Coronary Stent Intervention;  Surgeon: Kathleene Hazel, MD;  Location: [facility] INVASIVE CV LAB;  Service: Cardiovascular; 90% prox RI - > DES PCI Promus Premier 2.5 x 16 (2.75 mm)   CORONARY PRESSURE/FFR STUDY N/A 10/12/2021   Procedure: INTRAVASCULAR PRESSURE WIRE/FFR STUDY;  Surgeon: Marykay Lex, MD;  Location: [facility] INVASIVE CV LAB;  Service: Cardiovascular;  Laterality: N/A;   ESOPHAGOGASTRODUODENOSCOPY  01/10/2012   [id number]:[id number] in the distal esophagus2O TO GERD/Mild gastritis   LACERATION REPAIR     Right arm   LEFT HEART CATH AND CORS/GRAFTS ANGIOGRAPHY N/A 10/12/2021   Procedure: LEFT HEART CATH AND CORS/GRAFTS ANGIOGRAPHY;  Surgeon: Marykay Lex, MD;  Location: [facility] INVASIVE CV LAB;  Service: Cardiovascular;  Laterality: N/A;    Social History   Socioeconomic History   Marital status: Married    Spouse name: Not on file   Number of  children: Not on file   Years of education: Not on file   Highest education level: Not on file  Occupational History   Not on file  Tobacco Use   Smoking status: Former    Current packs/day: 0.00    Types: Cigarettes    Start date: 09/11/1983    Quit date: 08/30/2016    Years since quitting: 6.9   Smokeless tobacco: Never  Vaping Use   Vaping status: Never Used  Substance and Sexual Activity   Alcohol use: Yes    Comment: 16-32oz daily   Drug use: No   Sexual activity: Yes  Other Topics Concern   Not on file  Social History Narrative   Not on file   Social Determinants of Health   Financial Resource Strain: Not on file  Food Insecurity: Not on file  Transportation Needs: Not on file  Physical Activity: Not on file  Stress: Not on file  Social Connections: Not on file    Family History  Problem Relation Age of Onset   Hypertension Mother    CAD Mother 16   Heart failure Mother    Heart failure Father  Hypertension Father    Diabetes Father    CAD Father 36   Hyperlipidemia Father    Hypertension Brother    Colon cancer Neg Hx    Colon polyps Neg Hx    Esophageal cancer Neg Hx    Pancreatic cancer Neg Hx    Stomach cancer Neg Hx     Outpatient Encounter Medications as of 08/18/2023  Medication Sig   Continuous Glucose Receiver (FREESTYLE LIBRE 3 READER) DEVI 1 Piece by Does not apply route once as needed for up to 1 dose.   Continuous Glucose Sensor (FREESTYLE LIBRE 3 SENSOR) MISC 1 Piece by Does not apply route every 14 (fourteen) days. Place 1 sensor on the skin every 14 days. Use to check glucose continuously   aspirin EC 81 MG tablet Take 1 tablet (81 mg total) by mouth daily. Swallow whole.   clotrimazole-betamethasone (LOTRISONE) cream Apply 1 Application topically daily. Apply to affected area twice daily   empagliflozin (JARDIANCE) 25 MG TABS tablet Take 1 tablet (25 mg total) by mouth daily before breakfast.   insulin degludec (TRESIBA FLEXTOUCH) 100  UNIT/ML FlexTouch Pen Inject 30 Units into the skin at bedtime.   Insulin Pen Needle (BD PEN NEEDLE NANO U/F) 32G X 4 MM MISC 1 each by Does not apply route 4 (four) times daily.   isosorbide mononitrate (IMDUR) 30 MG 24 hr tablet Take 1 tablet (30 mg total) by mouth daily.   nitroGLYCERIN (NITROSTAT) 0.4 MG SL tablet Place 1 tablet (0.4 mg total) under the tongue every 5 (five) minutes as needed for chest pain.   Vitamin D, Ergocalciferol, (DRISDOL) 1.25 MG (50000 UNIT) CAPS capsule Take 1 capsule (50,000 Units total) by mouth every 7 (seven) days.   [DISCONTINUED] empagliflozin (JARDIANCE) 10 MG TABS tablet Take 1 tablet (10 mg total) by mouth daily before breakfast.   [DISCONTINUED] insulin degludec (TRESIBA FLEXTOUCH) 100 UNIT/ML FlexTouch Pen Inject 20 Units into the skin at bedtime.   No facility-administered encounter medications on file as of 08/18/2023.    ALLERGIES: Allergies  Allergen Reactions   Insulin Glargine Anaphylaxis    Anaphylaxis    Lortab [Hydrocodone-Acetaminophen] Itching   Lipitor [Atorvastatin Calcium] Other (See Comments)    Extreme muscle weakness, passing out, generalized pain all through body   Hydrocodone Itching   Zocor [Simvastatin]     MYALGIAS   Iodinated Contrast Media Itching    Pt given contrast and began itching after injection.     VACCINATION STATUS: There is no immunization history for the selected administration types on file for this patient.  Diabetes He presents for his follow-up diabetic visit. He has type 2 diabetes mellitus. Onset time: He was diagnosed at approximate age of 57 years. His disease course has been improving. There are no hypoglycemic associated symptoms. Pertinent negatives for hypoglycemia include no confusion, headaches, pallor or seizures. Associated symptoms include blurred vision, polydipsia and polyuria. Pertinent negatives for diabetes include no chest pain, no fatigue, no polyphagia and no weakness. There are no  hypoglycemic complications. Symptoms are improving. Diabetic complications include heart disease. Risk factors for coronary artery disease include dyslipidemia, diabetes mellitus, hypertension, male sex and tobacco exposure. Current diabetic treatment includes insulin injections (Patient is not on any treatment currently, for several months.  His point-of-care A1c was 13.1%.  He presents with a page of blood glucose readings averaging greater than 350 mg per DL.). His weight is fluctuating minimally. He is following a generally unhealthy diet. When asked about meal planning,  he reported none. He has not had a previous visit with a dietitian. He participates in exercise intermittently. His home blood glucose trend is decreasing steadily. His breakfast blood glucose range is generally >200 mg/dl. His lunch blood glucose range is generally >200 mg/dl. His dinner blood glucose range is generally >200 mg/dl. His bedtime blood glucose range is generally >200 mg/dl. His overall blood glucose range is >200 mg/dl. (He brought a page of blood glucose logs showing significant improvement in his glycemic profile after he was initiated on basal insulin and Jardiance.   His logs are showing glycemia still significantly above target. -  His recent point-of-care A1c was 13.1%.     He does have a history of coronary artery disease status post stent placement in 2017.  ) An ACE inhibitor/angiotensin II receptor blocker is not being taken.  Hyperlipidemia This is a chronic problem. The current episode started more than 1 year ago. The problem is uncontrolled. Exacerbating diseases include diabetes. Pertinent negatives include no chest pain, myalgias or shortness of breath. He is currently on no antihyperlipidemic treatment (He does have history of statin intolerance.). Risk factors for coronary artery disease include dyslipidemia, diabetes mellitus, family history, hypertension and male sex.  Hypertension This is a chronic  problem. The current episode started more than 1 year ago. Associated symptoms include blurred vision. Pertinent negatives include no chest pain, headaches, neck pain, palpitations or shortness of breath. Risk factors for coronary artery disease include dyslipidemia, diabetes mellitus, male gender and smoking/tobacco exposure. Past treatments include direct vasodilators and beta blockers. Hypertensive end-organ damage includes CAD/MI.     Review of Systems  Constitutional:  Negative for chills, fatigue, fever and unexpected weight change.  HENT:  Negative for dental problem, mouth sores and trouble swallowing.   Eyes:  Positive for blurred vision. Negative for visual disturbance.  Respiratory:  Negative for cough, choking, chest tightness, shortness of breath and wheezing.   Cardiovascular:  Negative for chest pain, palpitations and leg swelling.  Gastrointestinal:  Negative for abdominal distention, abdominal pain, constipation, diarrhea, nausea and vomiting.  Endocrine: Positive for polydipsia and polyuria. Negative for polyphagia.  Genitourinary:  Negative for dysuria, flank pain, hematuria and urgency.  Musculoskeletal:  Negative for back pain, gait problem, myalgias and neck pain.  Skin:  Negative for pallor, rash and wound.  Neurological:  Negative for seizures, syncope, weakness, numbness and headaches.  Psychiatric/Behavioral:  Negative for confusion and dysphoric mood.     Objective:       08/18/2023    9:53 AM 07/25/2023    9:08 AM 07/02/2023    1:16 PM  Vitals with BMI  Height 5\' 9"  5\' 9"  5\' 9"   Weight 184 lbs 10 oz 181 lbs 6 oz 184 lbs 3 oz  BMI 27.25 26.78 27.19  Systolic 122 134 161  Diastolic 80 76 84  Pulse 80 64 88    BP 122/80   Pulse 80   Ht 5\' 9"  (1.753 m)   Wt 184 lb 9.6 oz (83.7 kg)   BMI 27.26 kg/m   Wt Readings from Last 3 Encounters:  08/18/23 184 lb 9.6 oz (83.7 kg)  07/25/23 181 lb 6.4 oz (82.3 kg)  07/02/23 184 lb 3.2 oz (83.6 kg)     CMP ( most  recent) CMP     Component Value Date/Time   NA 133 (L) 07/02/2023 1426   K 4.3 07/02/2023 1426   CL 98 07/02/2023 1426   CO2 26 07/02/2023 1426  GLUCOSE 396 (H) 07/02/2023 1426   BUN 15 07/02/2023 1426   CREATININE 0.83 07/02/2023 1426   CALCIUM 9.5 07/02/2023 1426   PROT 7.1 10/08/2022 1211   ALBUMIN 4.3 10/08/2022 1211   AST 17 10/08/2022 1211   ALT 21 10/08/2022 1211   ALKPHOS 74 10/08/2022 1211   BILITOT 0.7 10/08/2022 1211   GFRNONAA >60 07/02/2023 1426   GFRAA >60 09/21/2019 0555   Diabetic Labs (most recent): Lab Results  Component Value Date   HGBA1C 13.1 (A) 07/25/2023   HGBA1C 9.8 (H) 10/11/2021   HGBA1C 13.9 (H) 12/14/2016     Lipid Panel     Component Value Date/Time   CHOL 250 (H) 04/23/2022 1144   TRIG 214 (H) 04/23/2022 1144   HDL 38 (L) 04/23/2022 1144   CHOLHDL 6.6 (H) 04/23/2022 1144   CHOLHDL 8.0 09/21/2019 0555   VLDL 50 (H) 09/21/2019 0555   LDLCALC 172 (H) 04/23/2022 1144   LABVLDL 40 04/23/2022 1144      Lab Results  Component Value Date   TSH 0.867 07/02/2023   TSH 1.711 10/12/2021   TSH 1.925 09/21/2019     Assessment & Plan:   1. DM type 2 causing vascular disease (HCC)   - Shawn Hughes has currently uncontrolled symptomatic type 2 DM since  57 years of age.  He brought a page of blood glucose logs showing significant improvement in his glycemic profile after he was initiated on basal insulin and Jardiance.   His logs are showing glycemia still significantly above target. -  His recent point-of-care A1c was 13.1%.     He does have a history of coronary artery disease status post stent placement in 2017.    - I had a long discussion with him about the progressive nature of diabetes and the pathology behind its complications. -his diabetes is complicated by coronary artery disease which needed stent placement in 2017, chronic, severe hyperglycemic burden, comorbid hyperlipidemia with statin intolerance, hypertension and he  remains at extremely high risk for more acute and chronic complications which include CAD, CVA, CKD, retinopathy, and neuropathy. These are all discussed in detail with him.  - I discussed all available options of managing his diabetes including de-escalation of medications. I have counseled him on diet  and weight management  by adopting a Whole Food , Plant Predominant  ( WFPP) nutrition as recommended by [employer] of Lifestyle Medicine. Patient is encouraged to switch to  unprocessed or minimally processed  complex starch, adequate protein intake (mainly plant source), minimal liquid fat ( mainly vegetable oils), plenty of fruits, and vegetables. -  he is advised to stick to a routine mealtimes to eat 3 complete meals a day and snack only when necessary ( to snack only to correct hypoglycemia BG <70 day time or <100 at night).   - he acknowledges that there is a room for improvement in his food and drink choices. - Further Specific Suggestion is made for him to avoid simple carbohydrates  from his diet including Cakes, Sweet Desserts, Ice Cream, Soda (diet and regular), Sweet Tea, Candies, Chips, Cookies, Store Bought Juices, Alcohol in Excess of  1-2 drinks a day, Artificial Sweeteners,  Coffee Creamer, and "Sugar-free" Products. This will help patient to have more stable blood glucose profile and potentially avoid unintended weight gain.  - he will be scheduled with Norm Salt, RDN, CDE for individualized diabetes education.  - I have approached him with the following plan to manage  his diabetes and patient agrees:   -In light of his presentation with severe, persistent hyperglycemia, he will need multiple modality treatment in order for him to achieve control of diabetes to target.    -After his commitment is assured, he will be considered for either premixed insulin twice a day or basal/bolus insulin. - In preparation, I advised him to increase his [place] to 30 units nightly  associated with monitoring of blood glucose 4 times a day-before meals and at bedtime. -He will benefit from a CGM.  I discussed and prescribed the freestyle libre device for him.   -He is tolerating and benefiting from [place].  I discussed and increase his Jardiance to 25 mg p.o. daily at breakfast.   - he is encouraged to call clinic for blood glucose levels less than 70 or above 200 mg /dl.   -He will be considered for other additional options as appropriate on subsequent visits. - Specific targets for  A1c;  LDL, HDL,  and Triglycerides were discussed with the patient.  2) Blood Pressure /Hypertension: -His blood pressure is controlled to target.    he is advised to continue his current medications including metoprolol 25 mg p.o. once a day, isosorbide 30 mg p.o. twice daily.  He reports intolerance to lisinopril.   3) Lipids/Hyperlipidemia: His recent lipid panel showed uncontrolled LDL at 173.    He reports significant side effects from statins.  The severe dyslipidemia puts him at additional risk of coronary disease.  This makes it even more important for him to adopt plant-based nutrition discussed above.  This patient needs to be reapproached for the option.   He would also be considered for Repatha injection given high LDL.   4)  Weight/Diet:  Body mass index is 27.26 kg/m.  -   clearly complicating his diabetes care.   he is  a candidate for weight loss. I discussed with him the fact that loss of 5 - 10% of his  current body weight will have the most impact on his diabetes management.  The above detailed WFPP Nutrition will help manage weight as well. Optimal Exercise, Restorative Sleep  information was detailed on discharge instructions.  5) Chronic Care/Health Maintenance:  -he  is not on ACEI/ARB and Statin medications and  is encouraged to initiate and continue to follow up with Ophthalmology, Dentist,  Podiatrist at least yearly or according to recommendations, and advised  to   stay away from smoking. I have recommended yearly flu vaccine and pneumonia vaccine at least every 5 years; moderate intensity exercise for up to 150 minutes weekly; and  sleep for 7- 9 hours a day.  - he is  advised to maintain close follow up with Pcp, No for primary care needs, as well as his other providers for optimal and coordinated care.   I spent  26  minutes in the care of the patient today including review of labs from CMP, Lipids, Thyroid Function, Hematology (current and previous including abstractions from other facilities); face-to-face time discussing  his blood glucose readings/logs, discussing hypoglycemia and hyperglycemia episodes and symptoms, medications doses, his options of short and long term treatment based on the latest standards of care / guidelines;  discussion about incorporating lifestyle medicine;  and documenting the encounter. Risk reduction counseling performed per USPSTF guidelines to reduce  cardiovascular risk factors.     Please refer to Patient Instructions for Blood Glucose Monitoring and Insulin/Medications Dosing Guide"  in media tab for additional information. Please  also refer to " Patient Self Inventory" in the Media  tab for reviewed elements of pertinent patient history.  Shawn Hughes participated in the discussions, expressed understanding, and voiced agreement with the above plans.  All questions were answered to his satisfaction. he is encouraged to contact clinic should he have any questions or concerns prior to his return visit.   Follow up plan: - Return in about 4 months (around 12/18/2023) for Bring Meter/CGM Device/Logs- A1c in Office.  Marquis Lunch, MD [facility] [address] [place], [place] [address] Phone: [phone]  Fax: [phone]    08/18/2023, 11:06 AM  This note was partially dictated with voice recognition software. Similar sounding words can be transcribed  inadequately or may not  be corrected upon review.

## 2023-08-22 ENCOUNTER — Other Ambulatory Visit: Payer: Self-pay

## 2023-08-22 DIAGNOSIS — E1159 Type 2 diabetes mellitus with other circulatory complications: Secondary | ICD-10-CM

## 2023-08-22 MED ORDER — FREESTYLE LIBRE 3 PLUS SENSOR MISC: 2 refills | Status: DC

## 2023-10-05 ENCOUNTER — Other Ambulatory Visit: Payer: Self-pay | Admitting: "Endocrinology

## 2023-10-05 DIAGNOSIS — E1159 Type 2 diabetes mellitus with other circulatory complications: Secondary | ICD-10-CM

## 2023-10-17 ENCOUNTER — Ambulatory Visit: Payer: BC Managed Care – PPO | Admitting: Cardiology

## 2023-11-21 ENCOUNTER — Other Ambulatory Visit: Payer: Self-pay | Admitting: "Endocrinology

## 2023-12-22 ENCOUNTER — Encounter: Payer: Self-pay | Admitting: "Endocrinology

## 2023-12-22 ENCOUNTER — Ambulatory Visit (INDEPENDENT_AMBULATORY_CARE_PROVIDER_SITE_OTHER): Payer: BC Managed Care – PPO | Admitting: "Endocrinology

## 2023-12-22 VITALS — BP 142/88 | HR 108 | Ht 69.0 in | Wt 196.2 lb

## 2023-12-22 DIAGNOSIS — E782 Mixed hyperlipidemia: Secondary | ICD-10-CM

## 2023-12-22 DIAGNOSIS — E559 Vitamin D deficiency, unspecified: Secondary | ICD-10-CM | POA: Diagnosis not present

## 2023-12-22 DIAGNOSIS — E1159 Type 2 diabetes mellitus with other circulatory complications: Secondary | ICD-10-CM

## 2023-12-22 DIAGNOSIS — Z789 Other specified health status: Secondary | ICD-10-CM

## 2023-12-22 DIAGNOSIS — I10 Essential (primary) hypertension: Secondary | ICD-10-CM | POA: Diagnosis not present

## 2023-12-22 DIAGNOSIS — Z794 Long term (current) use of insulin: Secondary | ICD-10-CM

## 2023-12-22 LAB — POCT GLYCOSYLATED HEMOGLOBIN (HGB A1C): HbA1c, POC (controlled diabetic range): 8.9 % — AB (ref 0.0–7.0)

## 2023-12-22 MED ORDER — TRESIBA FLEXTOUCH 100 UNIT/ML ~~LOC~~ SOPN
40.0000 [IU] | PEN_INJECTOR | Freq: Every day | SUBCUTANEOUS | 1 refills | Status: DC
Start: 2023-12-22 — End: 2024-01-29

## 2023-12-22 MED ORDER — BD PEN NEEDLE NANO U/F 32G X 4 MM MISC: 1.0000 | Freq: Four times a day (QID) | 1 refills | Status: DC

## 2023-12-22 NOTE — Patient Instructions (Signed)

## 2023-12-22 NOTE — Progress Notes (Signed)
12/22/2023, 4:08 PM   Endocrinology follow-up note  Subjective:    Patient ID: Shawn Hughes, male    DOB: 03-Dec-1965.  Shawn Hughes is being seen in follow-up after he was seen in consultation for management of currently uncontrolled symptomatic diabetes .   Past Medical History:  Diagnosis Date   CAD (coronary artery disease)    a. 08/2016: cath showing 40% prox RCA, 10% LCx, 20% LAD and 99% RI stenosis. DES to RI.   COVID-14 October 2019 - was not hospitalized   Essential hypertension    GERD (gastroesophageal reflux disease)    History of nephrolithiasis    Mixed hyperlipidemia    Type 2 diabetes mellitus (HCC)     Past Surgical History:  Procedure Laterality Date   CARDIAC CATHETERIZATION N/A 08/30/2016   Procedure: Left Heart Cath and Coronary Angiography;  Surgeon: Kathleene Hazel, MD;  Location: [facility] INVASIVE CV LAB;  Service: Cardiovascular;  Laterality: N/A;   CARDIAC CATHETERIZATION N/A 08/30/2016   Procedure: Coronary Stent Intervention;  Surgeon: Kathleene Hazel, MD;  Location: [facility] INVASIVE CV LAB;  Service: Cardiovascular; 90% prox RI - > DES PCI Promus Premier 2.5 x 16 (2.75 mm)   CORONARY PRESSURE/FFR STUDY N/A 10/12/2021   Procedure: INTRAVASCULAR PRESSURE WIRE/FFR STUDY;  Surgeon: Marykay Lex, MD;  Location: [facility] INVASIVE CV LAB;  Service: Cardiovascular;  Laterality: N/A;   ESOPHAGOGASTRODUODENOSCOPY  01/10/2012   [id number] in the distal esophagus2O TO GERD/Mild gastritis   LACERATION REPAIR     Right arm   LEFT HEART CATH AND CORS/GRAFTS ANGIOGRAPHY N/A 10/12/2021   Procedure: LEFT HEART CATH AND CORS/GRAFTS ANGIOGRAPHY;  Surgeon: Marykay Lex, MD;  Location: [facility] INVASIVE CV LAB;  Service: Cardiovascular;  Laterality: N/A;    Social History   Socioeconomic History   Marital status: Married    Spouse name: Not on file   Number of  children: Not on file   Years of education: Not on file   Highest education level: Not on file  Occupational History   Not on file  Tobacco Use   Smoking status: Former    Current packs/day: 0.00    Types: Cigarettes    Start date: 09/11/1983    Quit date: 08/30/2016    Years since quitting: 7.3   Smokeless tobacco: Never  Vaping Use   Vaping status: Never Used  Substance and Sexual Activity   Alcohol use: Yes    Comment: 16-32oz daily   Drug use: No   Sexual activity: Yes  Other Topics Concern   Not on file  Social History Narrative   Not on file   Social Drivers of Health   Financial Resource Strain: Not on file  Food Insecurity: Not on file  Transportation Needs: Not on file  Physical Activity: Not on file  Stress: Not on file  Social Connections: Not on file    Family History  Problem Relation Age of Onset   Hypertension Mother    CAD Mother 31   Heart failure Mother    Heart failure Father  Hypertension Father    Diabetes Father    CAD Father 54   Hyperlipidemia Father    Hypertension Brother    Colon cancer Neg Hx    Colon polyps Neg Hx    Esophageal cancer Neg Hx    Pancreatic cancer Neg Hx    Stomach cancer Neg Hx     Outpatient Encounter Medications as of 12/22/2023  Medication Sig   aspirin EC 81 MG tablet Take 1 tablet (81 mg total) by mouth daily. Swallow whole.   clotrimazole-betamethasone (LOTRISONE) cream Apply 1 Application topically daily. Apply to affected area twice daily   Continuous Glucose Receiver (FREESTYLE LIBRE 3 READER) DEVI 1 Piece by Does not apply route once as needed for up to 1 dose.   Continuous Glucose Sensor (FREESTYLE LIBRE 3 PLUS SENSOR) MISC Use to monitor glucose continuously. Change sensor every 15 days.   empagliflozin (JARDIANCE) 25 MG TABS tablet Take 1 tablet (25 mg total) by mouth daily before breakfast.   insulin degludec (TRESIBA FLEXTOUCH) 100 UNIT/ML FlexTouch Pen Inject 40 Units into the skin at bedtime.    Insulin Pen Needle (BD PEN NEEDLE NANO U/F) 32G X 4 MM MISC 1 each by Does not apply route 4 (four) times daily.   isosorbide mononitrate (IMDUR) 30 MG 24 hr tablet Take 1 tablet (30 mg total) by mouth daily.   nitroGLYCERIN (NITROSTAT) 0.4 MG SL tablet Place 1 tablet (0.4 mg total) under the tongue every 5 (five) minutes as needed for chest pain.   Vitamin D, Ergocalciferol, (DRISDOL) 1.25 MG (50000 UNIT) CAPS capsule TAKE 1 CAPSULE (50,000 UNITS TOTAL) BY MOUTH EVERY 7 (SEVEN) DAYS   [DISCONTINUED] insulin degludec (TRESIBA FLEXTOUCH) 100 UNIT/ML FlexTouch Pen Inject 30 Units into the skin at bedtime.   [DISCONTINUED] Insulin Pen Needle (BD PEN NEEDLE NANO U/F) 32G X 4 MM MISC 1 each by Does not apply route 4 (four) times daily.   No facility-administered encounter medications on file as of 12/22/2023.    ALLERGIES: Allergies  Allergen Reactions   Insulin Glargine Anaphylaxis    Anaphylaxis    Lortab [Hydrocodone-Acetaminophen] Itching   Lipitor [Atorvastatin Calcium] Other (See Comments)    Extreme muscle weakness, passing out, generalized pain all through body   Hydrocodone Itching   Zocor [Simvastatin]     MYALGIAS   Iodinated Contrast Media Itching    Pt given contrast and began itching after injection.     VACCINATION STATUS: There is no immunization history for the selected administration types on file for this patient.  Diabetes He presents for his follow-up diabetic visit. He has type 2 diabetes mellitus. Onset time: He was diagnosed at approximate age of 58 years. His disease course has been improving. There are no hypoglycemic associated symptoms. Pertinent negatives for hypoglycemia include no confusion, headaches, pallor or seizures. Associated symptoms include blurred vision. Pertinent negatives for diabetes include no chest pain, no fatigue, no polydipsia, no polyphagia, no polyuria and no weakness. There are no hypoglycemic complications. Symptoms are improving. Diabetic  complications include heart disease. Risk factors for coronary artery disease include dyslipidemia, diabetes mellitus, hypertension, male sex and tobacco exposure. Current diabetic treatment includes insulin injections (Patient is not on any treatment currently, for several months.  His point-of-care A1c was 13.1%.  He presents with a page of blood glucose readings averaging greater than 350 mg per DL.). His weight is fluctuating minimally. He is following a generally unhealthy diet. When asked about meal planning, he reported none. He has not  had a previous visit with a dietitian. He participates in exercise intermittently. His home blood glucose trend is decreasing steadily. His breakfast blood glucose range is generally 140-180 mg/dl. His lunch blood glucose range is generally 180-200 mg/dl. His dinner blood glucose range is generally 180-200 mg/dl. His bedtime blood glucose range is generally 180-200 mg/dl. His overall blood glucose range is 180-200 mg/dl. (He brought a page of blood glucose logs showing significant improvement in his glycemic profile after he was initiated on basal insulin and Jardiance.   His logs are showing glycemia still significantly above target.  His point-of-care A1c is 8.9% improving from 13.1%.   He does have a history of coronary artery disease status post stent placement in 2017.  ) An ACE inhibitor/angiotensin II receptor blocker is not being taken.  Hyperlipidemia This is a chronic problem. The current episode started more than 1 year ago. The problem is uncontrolled. Exacerbating diseases include diabetes. Pertinent negatives include no chest pain, myalgias or shortness of breath. He is currently on no antihyperlipidemic treatment (He does have history of statin intolerance.). Risk factors for coronary artery disease include dyslipidemia, diabetes mellitus, family history, hypertension and male sex.  Hypertension This is a chronic problem. The current episode started more  than 1 year ago. Associated symptoms include blurred vision. Pertinent negatives include no chest pain, headaches, neck pain, palpitations or shortness of breath. Risk factors for coronary artery disease include dyslipidemia, diabetes mellitus, male gender and smoking/tobacco exposure. Past treatments include direct vasodilators and beta blockers. Hypertensive end-organ damage includes CAD/MI.     Review of Systems  Constitutional:  Negative for chills, fatigue, fever and unexpected weight change.  HENT:  Negative for dental problem, mouth sores and trouble swallowing.   Eyes:  Positive for blurred vision. Negative for visual disturbance.  Respiratory:  Negative for cough, choking, chest tightness, shortness of breath and wheezing.   Cardiovascular:  Negative for chest pain, palpitations and leg swelling.  Gastrointestinal:  Negative for abdominal distention, abdominal pain, constipation, diarrhea, nausea and vomiting.  Endocrine: Negative for polydipsia, polyphagia and polyuria.  Genitourinary:  Negative for dysuria, flank pain, hematuria and urgency.  Musculoskeletal:  Negative for back pain, gait problem, myalgias and neck pain.  Skin:  Negative for pallor, rash and wound.  Neurological:  Negative for seizures, syncope, weakness, numbness and headaches.  Psychiatric/Behavioral:  Negative for confusion and dysphoric mood.     Objective:       12/22/2023    3:46 PM 08/18/2023    9:53 AM 07/25/2023    9:08 AM  Vitals with BMI  Height 5\' 9"  5\' 9"  5\' 9"   Weight 196 lbs 3 oz 184 lbs 10 oz 181 lbs 6 oz  BMI 28.96 27.25 26.78  Systolic 142 122 324  Diastolic 88 80 76  Pulse 108 80 64    BP (!) 142/88   Pulse (!) 108   Ht 5\' 9"  (1.753 m)   Wt 196 lb 3.2 oz (89 kg)   BMI 28.97 kg/m   Wt Readings from Last 3 Encounters:  12/22/23 196 lb 3.2 oz (89 kg)  08/18/23 184 lb 9.6 oz (83.7 kg)  07/25/23 181 lb 6.4 oz (82.3 kg)     CMP ( most recent) CMP     Component Value Date/Time    NA 133 (L) 07/02/2023 1426   K 4.3 07/02/2023 1426   CL 98 07/02/2023 1426   CO2 26 07/02/2023 1426   GLUCOSE 396 (H) 07/02/2023 1426  BUN 15 07/02/2023 1426   CREATININE 0.83 07/02/2023 1426   CALCIUM 9.5 07/02/2023 1426   PROT 7.1 10/08/2022 1211   ALBUMIN 4.3 10/08/2022 1211   AST 17 10/08/2022 1211   ALT 21 10/08/2022 1211   ALKPHOS 74 10/08/2022 1211   BILITOT 0.7 10/08/2022 1211   GFRNONAA >60 07/02/2023 1426   GFRAA >60 09/21/2019 0555   Diabetic Labs (most recent): Lab Results  Component Value Date   HGBA1C 8.9 (A) 12/22/2023   HGBA1C 13.1 (A) 07/25/2023   HGBA1C 9.8 (H) 10/11/2021     Lipid Panel     Component Value Date/Time   CHOL 250 (H) 04/23/2022 1144   TRIG 214 (H) 04/23/2022 1144   HDL 38 (L) 04/23/2022 1144   CHOLHDL 6.6 (H) 04/23/2022 1144   CHOLHDL 8.0 09/21/2019 0555   VLDL 50 (H) 09/21/2019 0555   LDLCALC 172 (H) 04/23/2022 1144   LABVLDL 40 04/23/2022 1144      Lab Results  Component Value Date   TSH 0.867 07/02/2023   TSH 1.711 10/12/2021   TSH 1.925 09/21/2019     Assessment & Plan:   1. DM type 2 causing vascular disease (HCC)   - Shawn Hughes has currently uncontrolled symptomatic type 2 DM since  58 years of age.  He brought a page of blood glucose logs showing significant improvement in his glycemic profile after he was initiated on basal insulin and Jardiance.   His logs are showing glycemia still significantly above target.  His point-of-care A1c is 8.9% improving from 13.1%.   He does have a history of coronary artery disease status post stent placement in 2017.    - I had a long discussion with him about the progressive nature of diabetes and the pathology behind its complications. -his diabetes is complicated by coronary artery disease which needed stent placement in 2017, chronic, severe hyperglycemic burden, comorbid hyperlipidemia with statin intolerance, hypertension and he remains at extremely high risk for more  acute and chronic complications which include CAD, CVA, CKD, retinopathy, and neuropathy. These are all discussed in detail with him.  - I discussed all available options of managing his diabetes including de-escalation of medications. I have counseled him on diet  and weight management  by adopting a Whole Food , Plant Predominant  ( WFPP) nutrition as recommended by [employer] of Lifestyle Medicine. Patient is encouraged to switch to  unprocessed or minimally processed  complex starch, adequate protein intake (mainly plant source), minimal liquid fat ( mainly vegetable oils), plenty of fruits, and vegetables. -  he is advised to stick to a routine mealtimes to eat 3 complete meals a day and snack only when necessary ( to snack only to correct hypoglycemia BG <70 day time or <100 at night).   - he acknowledges that there is a room for improvement in his food and drink choices. - Further Specific Suggestion is made for him to avoid simple carbohydrates  from his diet including Cakes, Sweet Desserts, Ice Cream, Soda (diet and regular), Sweet Tea, Candies, Chips, Cookies, Store Bought Juices, Alcohol in Excess of  1-2 drinks a day, Artificial Sweeteners,  Coffee Creamer, and "Sugar-free" Products. This will help patient to have more stable blood glucose profile and potentially avoid unintended weight gain.  - he will be scheduled with Norm Salt, RDN, CDE for individualized diabetes education.  - I have approached him with the following plan to manage  his diabetes and patient agrees:   -In  light of his presentation with severe, persistent hyperglycemia, he will need multiple modality treatment in order for him to achieve control of diabetes to target.    -He is comfortable taking his insulin injections.  I advised him to increase [place] to 40 units nightly,  associated with monitoring of blood glucose 2 times a day-before breakfast and at bedtime. -He was not able to keep his CGM sensors on  his skin, did not utilize his supplies and would prefer to stay on using his glucometer.   -He is tolerating and benefiting from [place].  He is advised to continue Jardiance 25 mg p.o. daily at breakfast.     - he is encouraged to call clinic for blood glucose levels less than 70 or above 200 mg /dl.   -He will be considered for other additional options as appropriate on subsequent visits. - Specific targets for  A1c;  LDL, HDL,  and Triglycerides were discussed with the patient.  2) Blood Pressure /Hypertension: -His blood pressure is not on target.    he is advised to continue his current medications including metoprolol 25 mg p.o. once a day, isosorbide 30 mg p.o. twice daily.  He reports intolerance to lisinopril.   3) Lipids/Hyperlipidemia: His recent lipid panel showed uncontrolled LDL at 173.    He reports significant side effects from statins.  The severe dyslipidemia puts him at additional risk of coronary disease.  This makes it even more important for him to adopt plant-based nutrition discussed above.  This patient needs to be reapproached for the option.   He would also be considered for Repatha injection given high LDL.   4)  Weight/Diet:  Body mass index is 28.97 kg/m.  -   clearly complicating his diabetes care.   he is  a candidate for weight loss. I discussed with him the fact that loss of 5 - 10% of his  current body weight will have the most impact on his diabetes management.  The above detailed WFPP Nutrition will help manage weight as well. Optimal Exercise, Restorative Sleep  information was detailed on discharge instructions.  5) Chronic Care/Health Maintenance:  -he  is not on ACEI/ARB and Statin medications and  is encouraged to initiate and continue to follow up with Ophthalmology, Dentist,  Podiatrist at least yearly or according to recommendations, and advised to   stay away from smoking. I have recommended yearly flu vaccine and pneumonia vaccine at least  every 5 years; moderate intensity exercise for up to 150 minutes weekly; and  sleep for 7- 9 hours a day.  -He reports some blurring of vision on right eye.  He is advised to reconnect with his ophthalmologist for better evaluation. - he is  advised to maintain close follow up with Donetta Potts, MD for primary care needs, as well as his other providers for optimal and coordinated care.   I spent  26  minutes in the care of the patient today including review of labs from CMP, Lipids, Thyroid Function, Hematology (current and previous including abstractions from other facilities); face-to-face time discussing  his blood glucose readings/logs, discussing hypoglycemia and hyperglycemia episodes and symptoms, medications doses, his options of short and long term treatment based on the latest standards of care / guidelines;  discussion about incorporating lifestyle medicine;  and documenting the encounter. Risk reduction counseling performed per USPSTF guidelines to reduce cardiovascular risk factors.     Please refer to Patient Instructions for Blood Glucose Monitoring and Insulin/Medications Dosing  Guide"  in media tab for additional information. Please  also refer to " Patient Self Inventory" in the Media  tab for reviewed elements of pertinent patient history.  Shawn Hughes participated in the discussions, expressed understanding, and voiced agreement with the above plans.  All questions were answered to his satisfaction. he is encouraged to contact clinic should he have any questions or concerns prior to his return visit.   Follow up plan: - Return in about 4 months (around 04/20/2024) for F/U with Pre-visit Labs, Meter/CGM/Logs, A1c here.  Marquis Lunch, MD [facility] [address] [place], [place] [address] Phone: [phone]  Fax: [phone]    12/22/2023, 4:08 PM  This note was partially dictated with voice recognition  software. Similar sounding words can be transcribed inadequately or may not  be corrected upon review.

## 2024-01-15 ENCOUNTER — Other Ambulatory Visit: Payer: Self-pay | Admitting: "Endocrinology

## 2024-01-29 ENCOUNTER — Other Ambulatory Visit: Payer: Self-pay

## 2024-01-29 DIAGNOSIS — E1159 Type 2 diabetes mellitus with other circulatory complications: Secondary | ICD-10-CM

## 2024-01-29 MED ORDER — TRESIBA FLEXTOUCH 100 UNIT/ML ~~LOC~~ SOPN: 40.0000 [IU] | PEN_INJECTOR | Freq: Every day | SUBCUTANEOUS | 0 refills | Status: DC

## 2024-02-10 ENCOUNTER — Other Ambulatory Visit: Payer: Self-pay | Admitting: "Endocrinology

## 2024-02-10 DIAGNOSIS — E1159 Type 2 diabetes mellitus with other circulatory complications: Secondary | ICD-10-CM

## 2024-03-22 ENCOUNTER — Other Ambulatory Visit: Payer: Self-pay | Admitting: "Endocrinology

## 2024-03-23 ENCOUNTER — Emergency Department (HOSPITAL_COMMUNITY)
Admission: EM | Admit: 2024-03-23 | Discharge: 2024-03-23 | Disposition: A | Discharge status: Home / Self Care | Attending: Emergency Medicine | Admitting: Emergency Medicine

## 2024-03-23 ENCOUNTER — Encounter (HOSPITAL_COMMUNITY): Payer: Self-pay | Admitting: *Deleted

## 2024-03-23 ENCOUNTER — Other Ambulatory Visit: Payer: Self-pay

## 2024-03-23 ENCOUNTER — Emergency Department (HOSPITAL_COMMUNITY)

## 2024-03-23 DIAGNOSIS — R079 Chest pain, unspecified: Secondary | ICD-10-CM

## 2024-03-23 DIAGNOSIS — R0602 Shortness of breath: Secondary | ICD-10-CM | POA: Insufficient documentation

## 2024-03-23 DIAGNOSIS — R0789 Other chest pain: Secondary | ICD-10-CM | POA: Diagnosis present

## 2024-03-23 DIAGNOSIS — Z7982 Long term (current) use of aspirin: Secondary | ICD-10-CM | POA: Diagnosis not present

## 2024-03-23 DIAGNOSIS — Z794 Long term (current) use of insulin: Secondary | ICD-10-CM | POA: Diagnosis not present

## 2024-03-23 DIAGNOSIS — I2510 Atherosclerotic heart disease of native coronary artery without angina pectoris: Secondary | ICD-10-CM | POA: Insufficient documentation

## 2024-03-23 LAB — CBC WITH DIFFERENTIAL/PLATELET
Abs Immature Granulocytes: 0.01 10*3/uL (ref 0.00–0.07)
Basophils Absolute: 0.1 10*3/uL (ref 0.0–0.1)
Basophils Relative: 1 %
Eosinophils Absolute: 0.3 10*3/uL (ref 0.0–0.5)
Eosinophils Relative: 5 %
HCT: 51 % (ref 39.0–52.0)
Hemoglobin: 17.3 g/dL — ABNORMAL HIGH (ref 13.0–17.0)
Immature Granulocytes: 0 %
Lymphocytes Relative: 20 %
Lymphs Abs: 1.3 10*3/uL (ref 0.7–4.0)
MCH: 32.1 pg (ref 26.0–34.0)
MCHC: 33.9 g/dL (ref 30.0–36.0)
MCV: 94.6 fL (ref 80.0–100.0)
Monocytes Absolute: 0.5 10*3/uL (ref 0.1–1.0)
Monocytes Relative: 8 %
Neutro Abs: 4.3 10*3/uL (ref 1.7–7.7)
Neutrophils Relative %: 66 %
Platelets: 297 10*3/uL (ref 150–400)
RBC: 5.39 MIL/uL (ref 4.22–5.81)
RDW: 13.1 % (ref 11.5–15.5)
WBC: 6.5 10*3/uL (ref 4.0–10.5)
nRBC: 0 % (ref 0.0–0.2)

## 2024-03-23 LAB — COMPREHENSIVE METABOLIC PANEL WITH GFR
ALT: 19 U/L (ref 0–44)
AST: 16 U/L (ref 15–41)
Albumin: 4.3 g/dL (ref 3.5–5.0)
Alkaline Phosphatase: 68 U/L (ref 38–126)
Anion gap: 10 (ref 5–15)
BUN: 15 mg/dL (ref 6–20)
CO2: 23 mmol/L (ref 22–32)
Calcium: 9.7 mg/dL (ref 8.9–10.3)
Chloride: 102 mmol/L (ref 98–111)
Creatinine, Ser: 0.89 mg/dL (ref 0.61–1.24)
GFR, Estimated: >60 mL/min (ref >60)
Glucose, Bld: 268 mg/dL — ABNORMAL HIGH (ref 70–99)
Potassium: 4.5 mmol/L (ref 3.5–5.1)
Sodium: 135 mmol/L (ref 135–145)
Total Bilirubin: 0.5 mg/dL (ref 0.0–1.2)
Total Protein: 7.5 g/dL (ref 6.5–8.1)

## 2024-03-23 LAB — TROPONIN I (HIGH SENSITIVITY)
Troponin I (High Sensitivity): 3 ng/L (ref <18)
Troponin I (High Sensitivity): 3 ng/L (ref <18)

## 2024-03-23 MED ORDER — MORPHINE SULFATE (PF) 4 MG/ML IV SOLN
4.0000 mg | Freq: Once | INTRAVENOUS | Status: AC
  Administered 2024-03-23: 4 mg via INTRAVENOUS
  Filled 2024-03-23: qty 1

## 2024-03-23 MED ORDER — ISOSORBIDE MONONITRATE ER 30 MG PO TB24
30.0000 mg | ORAL_TABLET | Freq: Every day | ORAL | Status: DC
  Administered 2024-03-23: 30 mg via ORAL
  Filled 2024-03-23: qty 1

## 2024-03-23 MED ORDER — NITROGLYCERIN 2 % TD OINT
1.0000 [in_us] | TOPICAL_OINTMENT | Freq: Once | TRANSDERMAL | Status: AC
  Administered 2024-03-23: 1 [in_us] via TOPICAL
  Filled 2024-03-23: qty 1

## 2024-03-23 MED ORDER — ONDANSETRON HCL 4 MG/2ML IJ SOLN
4.0000 mg | Freq: Once | INTRAMUSCULAR | Status: AC
  Administered 2024-03-23: 4 mg via INTRAVENOUS
  Filled 2024-03-23: qty 2

## 2024-03-23 MED ORDER — ASPIRIN 81 MG PO CHEW
324.0000 mg | CHEWABLE_TABLET | Freq: Once | ORAL | Status: AC
  Administered 2024-03-23: 324 mg via ORAL
  Filled 2024-03-23: qty 4

## 2024-03-23 MED ORDER — SODIUM CHLORIDE 0.9 % IV BOLUS
500.0000 mL | Freq: Once | INTRAVENOUS | Status: AC
  Administered 2024-03-23: 500 mL via INTRAVENOUS

## 2024-03-23 MED ORDER — ISOSORBIDE MONONITRATE ER 30 MG PO TB24: 30.0000 mg | ORAL_TABLET | Freq: Every day | ORAL | 0 refills | Status: DC

## 2024-03-23 NOTE — ED Provider Notes (Signed)
 [place] EMERGENCY DEPARTMENT AT [facility] Provider Note   CSN: [id number] Arrival date & time: 03/23/24  8416     History  Chief Complaint  Patient presents with   Chest Pain    Shawn Hughes is a 58 y.o. male.  Patient is a 58 year old male who presents to the emergency department with a chief complaint of chest pain which has been ongoing since yesterday.  He does note that the pain has been worse with exertion and improves with rest.  He also notes that the pain does improve when he takes his nitroglycerin .  Patient does have a history of coronary artery disease.  His most recent cardiac cath was 3 years ago.  Patient notes that he has not seen his cardiologist in quite some time.  He denies any associated recent cough, congestion, rhinorrhea, sore throat.  He does note that he has had some shortness of breath and intermittent dizziness.  He denies any lower extremity pain or edema, hemoptysis.  He denies any vomiting but does admit to associated nausea.   Chest Pain      Home Medications Prior to Admission medications   Medication Sig Start Date End Date Taking? Authorizing Provider  aspirin  EC 81 MG tablet Take 1 tablet (81 mg total) by mouth daily. Swallow whole. 07/02/23   Strader, Dimple Francis, PA-C  clotrimazole -betamethasone  (LOTRISONE ) cream Apply 1 Application topically daily. Apply to affected area twice daily 07/25/23   Nida, Gebreselassie W, MD  Continuous Glucose Receiver (FREESTYLE LIBRE 3 READER) DEVI 1 Piece by Does not apply route once as needed for up to 1 dose. 08/18/23   Nida, Gebreselassie W, MD  Continuous Glucose Sensor (FREESTYLE LIBRE 3 PLUS SENSOR) MISC Use to monitor glucose continuously. Change sensor every 15 days. 08/22/23   Nida, Gebreselassie W, MD  insulin  degludec (TRESIBA  FLEXTOUCH) 100 UNIT/ML FlexTouch Pen Inject 40 Units into the skin at bedtime. 01/29/24   Nida, Gebreselassie W, MD  Insulin  Pen Needle (BD PEN NEEDLE NANO U/F) 32G X 4  MM MISC 1 each by Does not apply route 4 (four) times daily. 12/22/23   Nida, Gebreselassie W, MD  isosorbide  mononitrate (IMDUR ) 30 MG 24 hr tablet Take 1 tablet (30 mg total) by mouth daily. 07/02/23 09/30/23  Strader, Dimple Francis, PA-C  JARDIANCE  25 MG TABS tablet TAKE 1 TABLET BY MOUTH DAILY BEFORE BREAKFAST. 02/11/24   Nida, Gebreselassie W, MD  nitroGLYCERIN  (NITROSTAT ) 0.4 MG SL tablet Place 1 tablet (0.4 mg total) under the tongue every 5 (five) minutes as needed for chest pain. 07/26/20   Gerard Knight, MD  Vitamin D , Ergocalciferol , (DRISDOL ) 1.25 MG (50000 UNIT) CAPS capsule TAKE 1 CAPSULE (50,000 UNITS TOTAL) BY MOUTH EVERY 7 (SEVEN) DAYS 01/16/24   Nida, Gebreselassie W, MD      Allergies    Insulin  glargine, Lortab [hydrocodone -acetaminophen ], Lipitor [atorvastatin  calcium ], Hydrocodone , Zocor [simvastatin], and Iodinated contrast media    Review of Systems   Review of Systems  Cardiovascular:  Positive for chest pain.  All other systems reviewed and are negative.   Physical Exam Updated Vital Signs BP (!) 141/95   Pulse 100   Temp 97.6 F (36.4 C) (Oral)   Resp 16   Ht 5\' 9"  (1.753 m)   Wt 92.5 kg   SpO2 98%   BMI 30.13 kg/m  Physical Exam Vitals and nursing note reviewed.  Constitutional:      Appearance: Normal appearance.  HENT:     Head:  Normocephalic and atraumatic.     Nose: Nose normal.     Mouth/Throat:     Mouth: Mucous membranes are moist.  Eyes:     Extraocular Movements: Extraocular movements intact.     Conjunctiva/sclera: Conjunctivae normal.     Pupils: Pupils are equal, round, and reactive to light.  Cardiovascular:     Rate and Rhythm: Normal rate and regular rhythm.     Pulses: Normal pulses.     Heart sounds: Normal heart sounds. Heart sounds not distant. No murmur heard. Pulmonary:     Effort: Pulmonary effort is normal. No tachypnea or respiratory distress.     Breath sounds: Normal breath sounds. No decreased breath sounds, wheezing,  rhonchi or rales.  Abdominal:     General: Abdomen is flat. Bowel sounds are normal.     Palpations: Abdomen is soft. There is no mass.     Tenderness: There is no abdominal tenderness. There is no guarding.  Musculoskeletal:        General: Normal range of motion.     Cervical back: Normal range of motion and neck supple.     Right lower leg: No tenderness. No edema.     Left lower leg: No tenderness. No edema.  Skin:    General: Skin is warm and dry.     Findings: No rash.  Neurological:     General: No focal deficit present.     Mental Status: He is alert and oriented to person, place, and time. Mental status is at baseline.  Psychiatric:        Mood and Affect: Mood normal.        Behavior: Behavior normal.        Thought Content: Thought content normal.        Judgment: Judgment normal.     ED Results / Procedures / Treatments   Labs (all labs ordered are listed, but only abnormal results are displayed) Labs Reviewed  COMPREHENSIVE METABOLIC PANEL WITH GFR  CBC WITH DIFFERENTIAL/PLATELET  TROPONIN I (HIGH SENSITIVITY)    EKG EKG Interpretation Date/Time:  Tuesday March 23 2024 09:51:39 EDT Ventricular Rate:  102 PR Interval:  118 QRS Duration:  96 QT Interval:  320 QTC Calculation: 417 R Axis:   71  Text Interpretation: Sinus tachycardia Nonspecific T wave abnormality Abnormal ECG When compared with ECG of 15-May-2023 09:40, nonspecific t wave inversions inferior new Confirmed by Racheal Buddle [phone]) on 03/23/2024 9:53:07 AM  Radiology No results found.  Procedures Procedures    Medications Ordered in ED Medications  morphine  (PF) 4 MG/ML injection 4 mg (has no administration in time range)  ondansetron  (ZOFRAN ) injection 4 mg (has no administration in time range)  sodium chloride  0.9 % bolus 500 mL (has no administration in time range)  aspirin  chewable tablet 324 mg (has no administration in time range)  nitroGLYCERIN  (NITROGLYN) 2 % ointment 1 inch  (has no administration in time range)    ED Course/ Medical Decision Making/ A&P Clinical Course as of 03/23/24 1422  Tue Mar 23, 2024  1019 EKG interpretation: Sinus tachycardia, rate of 102, normal PR/QRS interval, normal QTc, T wave inversions in inferior leads, no ST changes, no STEMI, normal axis [CR]    Clinical Course User Index [CR] Roselynn Connors, PA-C                                 Medical Decision Making  Amount and/or Complexity of Data Reviewed Labs: ordered. Radiology: ordered.  Risk OTC drugs. Prescription drug management.   This patient presents to the ED for concern of chest pain differential diagnosis includes ACS, pulmonary embolus, pericarditis, myocarditis, endocarditis, sepsis, stable angina, pneumonia, pneumothorax, hemothorax, chest wall pain    Additional history obtained:  Additional history obtained from medical records External records from outside source obtained and reviewed including medical records   Lab Tests:  I Ordered, and personally interpreted labs.  The pertinent results include: No leukocytosis, no anemia, hyperglycemia, normal electrolytes, normal kidney function liver function, negative serial troponins   Imaging Studies ordered:  I ordered imaging studies including chest x-ray I independently visualized and interpreted imaging which showed no acute cardiopulmonary process I agree with the radiologist interpretation   Medicines ordered and prescription drug management:  I ordered medication including morphine , Nitropaste, Zofran , IV fluids for chest pain Reevaluation of the patient after these medicines showed that the patient resolved I have reviewed the patients home medicines and have made adjustments as needed   Problem List / ED Course:  Patient is feeling much better at this time and notes that chest pain has completely resolved.  Discussed with patient all workup in the emergency department has been  unremarkable.  Did discuss patient case with Dr. Renna Cary with cardiology who did recommend as long as he had negative serial troponins he could be discharged home with outpatient follow-up.  On reevaluation patient is chest pain-free at this time.  Cardiology did recommend placing patient back on his Imdur .  Chest x-ray demonstrated no signs of pneumonia, pneumothorax, hemothorax.  Suspect that pulmonary embolus is less likely in this patient.  Do not suspect aortic aneurysm or dissection.  Patient's abdominal exam is benign with no focal tenderness throughout do not suspect an acute abdominal surgical process.  The need for close follow-up with cardiology on outpatient basis was discussed as well as strict turn precautions for any new or worsening symptoms.  Patient voiced understanding to the plan and had no additional questions.   Social Determinants of Health:  None           Final Clinical Impression(s) / ED Diagnoses Final diagnoses:  None    Rx / DC Orders ED Discharge Orders     None         Emmalene Hare 03/23/24 1425    Tonya Fredrickson, MD 03/23/24 1715

## 2024-03-23 NOTE — Discharge Instructions (Addendum)
 Please follow-up closely with cardiology on an outpatient basis.  Please begin taking the Imdur  daily.  Return to emergency department immediately for any new or worsening symptoms.

## 2024-03-23 NOTE — ED Triage Notes (Signed)
 Pt c/o chest pain that started yesterday and he describes it has tighness, aching and sharp  Pt has taken approximately 6 nitroglycerin  since yesterday with the last one at 0907 today  Pt c/o weakness, nausea, sob and dizziness

## 2024-04-05 ENCOUNTER — Other Ambulatory Visit: Payer: Self-pay | Admitting: "Endocrinology

## 2024-04-05 DIAGNOSIS — E1159 Type 2 diabetes mellitus with other circulatory complications: Secondary | ICD-10-CM

## 2024-04-15 LAB — T4, FREE: Free T4: 1.12 ng/dL (ref 0.82–1.77)

## 2024-04-15 LAB — COMPREHENSIVE METABOLIC PANEL WITH GFR
ALT: 24 IU/L (ref 0–44)
AST: 16 IU/L (ref 0–40)
Albumin: 4.9 g/dL (ref 3.8–4.9)
Alkaline Phosphatase: 95 IU/L (ref 44–121)
BUN/Creatinine Ratio: 16 (ref 9–20)
BUN: 15 mg/dL (ref 6–24)
Bilirubin Total: 0.3 mg/dL (ref 0.0–1.2)
CO2: 23 mmol/L (ref 20–29)
Calcium: 10.3 mg/dL — ABNORMAL HIGH (ref 8.7–10.2)
Chloride: 103 mmol/L (ref 96–106)
Creatinine, Ser: 0.96 mg/dL (ref 0.76–1.27)
Globulin, Total: 2.7 g/dL (ref 1.5–4.5)
Glucose: 108 mg/dL — ABNORMAL HIGH (ref 70–99)
Potassium: 5.1 mmol/L (ref 3.5–5.2)
Sodium: 141 mmol/L (ref 134–144)
Total Protein: 7.6 g/dL (ref 6.0–8.5)
eGFR: 93 mL/min/{1.73_m2} (ref >59)

## 2024-04-15 LAB — TSH: TSH: 1.28 u[IU]/mL (ref 0.450–4.500)

## 2024-04-15 LAB — LIPID PANEL
Chol/HDL Ratio: 7 ratio — ABNORMAL HIGH (ref 0.0–5.0)
Cholesterol, Total: 244 mg/dL — ABNORMAL HIGH (ref 100–199)
HDL: 35 mg/dL — ABNORMAL LOW (ref >39)
LDL Chol Calc (NIH): 168 mg/dL — ABNORMAL HIGH (ref 0–99)
Triglycerides: 218 mg/dL — ABNORMAL HIGH (ref 0–149)
VLDL Cholesterol Cal: 41 mg/dL — ABNORMAL HIGH (ref 5–40)

## 2024-04-22 ENCOUNTER — Ambulatory Visit (INDEPENDENT_AMBULATORY_CARE_PROVIDER_SITE_OTHER): Payer: BC Managed Care – PPO | Admitting: "Endocrinology

## 2024-04-22 ENCOUNTER — Encounter: Payer: Self-pay | Admitting: "Endocrinology

## 2024-04-22 VITALS — BP 128/88 | HR 112 | Ht 69.0 in | Wt 197.2 lb

## 2024-04-22 DIAGNOSIS — I10 Essential (primary) hypertension: Secondary | ICD-10-CM

## 2024-04-22 DIAGNOSIS — E782 Mixed hyperlipidemia: Secondary | ICD-10-CM | POA: Diagnosis not present

## 2024-04-22 DIAGNOSIS — E1159 Type 2 diabetes mellitus with other circulatory complications: Secondary | ICD-10-CM

## 2024-04-22 DIAGNOSIS — Z7984 Long term (current) use of oral hypoglycemic drugs: Secondary | ICD-10-CM | POA: Diagnosis not present

## 2024-04-22 DIAGNOSIS — Z794 Long term (current) use of insulin: Secondary | ICD-10-CM

## 2024-04-22 LAB — POCT GLYCOSYLATED HEMOGLOBIN (HGB A1C): HbA1c, POC (controlled diabetic range): 8.9 % — AB (ref 0.0–7.0)

## 2024-04-22 MED ORDER — ACCU-CHEK GUIDE TEST VI STRP: ORAL_STRIP | 2 refills | Status: AC

## 2024-04-22 MED ORDER — ACCU-CHEK GUIDE ME W/DEVICE KIT: 1.0000 | PACK | 0 refills | Status: DC

## 2024-04-22 NOTE — Progress Notes (Signed)
 04/22/2024, 6:30 PM   Endocrinology follow-up note  Subjective:    Patient ID: Shawn Hughes, male    DOB: Mar 29, 1966.  Azim D Gutman is being seen in follow-up after he was seen in consultation for management of currently uncontrolled symptomatic diabetes .   Past Medical History:  Diagnosis Date   CAD (coronary artery disease)    a. 08/2016: cath showing 40% prox RCA, 10% LCx, 20% LAD and 99% RI stenosis. DES to RI.   COVID-14 October 2019 - was not hospitalized   Essential hypertension    GERD (gastroesophageal reflux disease)    History of nephrolithiasis    Mixed hyperlipidemia    Type 2 diabetes mellitus (HCC)     Past Surgical History:  Procedure Laterality Date   CARDIAC CATHETERIZATION N/A 08/30/2016   Procedure: Left Heart Cath and Coronary Angiography;  Surgeon: Odie Benne, MD;  Location: [facility] INVASIVE CV LAB;  Service: Cardiovascular;  Laterality: N/A;   CARDIAC CATHETERIZATION N/A 08/30/2016   Procedure: Coronary Stent Intervention;  Surgeon: Odie Benne, MD;  Location: [facility] INVASIVE CV LAB;  Service: Cardiovascular; 90% prox RI - > DES PCI Promus Premier 2.5 x 16 (2.75 mm)   CORONARY PRESSURE/FFR STUDY N/A 10/12/2021   Procedure: INTRAVASCULAR PRESSURE WIRE/FFR STUDY;  Surgeon: Arleen Lacer, MD;  Location: [facility] INVASIVE CV LAB;  Service: Cardiovascular;  Laterality: N/A;   ESOPHAGOGASTRODUODENOSCOPY  01/10/2012   [id number] in the distal esophagus2O TO GERD/Mild gastritis   LACERATION REPAIR     Right arm   LEFT HEART CATH AND CORS/GRAFTS ANGIOGRAPHY N/A 10/12/2021   Procedure: LEFT HEART CATH AND CORS/GRAFTS ANGIOGRAPHY;  Surgeon: Arleen Lacer, MD;  Location: [facility] INVASIVE CV LAB;  Service: Cardiovascular;  Laterality: N/A;    Social History   Socioeconomic History   Marital status: Married    Spouse name: Not on file   Number of  children: Not on file   Years of education: Not on file   Highest education level: Not on file  Occupational History   Not on file  Tobacco Use   Smoking status: Former    Current packs/day: 0.00    Types: Cigarettes    Start date: 09/11/1983    Quit date: 08/30/2016    Years since quitting: 7.6   Smokeless tobacco: Never  Vaping Use   Vaping status: Never Used  Substance and Sexual Activity   Alcohol use: Yes    Comment: 16-32oz daily   Drug use: No   Sexual activity: Yes  Other Topics Concern   Not on file  Social History Narrative   Not on file   Social Drivers of Health   Financial Resource Strain: Not on file  Food Insecurity: Low Risk  (12/24/2023)   Received from [facility]   Hunger Vital Sign    Worried About Running Out of Food in the Last Year: Never true    Ran Out of Food in the Last Year: Never true  Transportation Needs: No Transportation Needs (12/24/2023)   Received from [facility]  In the past 12 months, has lack of reliable transportation kept you from medical appointments, meetings, work or from getting things needed for daily living? : No  Physical Activity: Not on file  Stress: Not on file  Social Connections: Not on file    Family History  Problem Relation Age of Onset   Hypertension Mother    CAD Mother 60   Heart failure Mother    Heart failure Father    Hypertension Father    Diabetes Father    CAD Father 80   Hyperlipidemia Father    Hypertension Brother    Colon cancer Neg Hx    Colon polyps Neg Hx    Esophageal cancer Neg Hx    Pancreatic cancer Neg Hx    Stomach cancer Neg Hx     Outpatient Encounter Medications as of 04/22/2024  Medication Sig   Blood Glucose Monitoring Suppl (ACCU-CHEK GUIDE ME) w/Device KIT 1 Piece by Does not apply route as directed.   glucose blood (ACCU-CHEK GUIDE TEST) test strip Use to monitor glucose 4 times daily as instructed   insulin  degludec (TRESIBA  FLEXTOUCH) 100 UNIT/ML  FlexTouch Pen Inject 50 Units into the skin at bedtime.   aspirin  EC 81 MG tablet Take 1 tablet (81 mg total) by mouth daily. Swallow whole.   clotrimazole -betamethasone  (LOTRISONE ) cream Apply 1 Application topically daily. Apply to affected area twice daily   Insulin  Pen Needle (BD PEN NEEDLE NANO 2ND GEN) 32G X 4 MM MISC Use to inject insulin  daily as directed.   isosorbide  mononitrate (IMDUR ) 30 MG 24 hr tablet Take 1 tablet (30 mg total) by mouth daily.   JARDIANCE  25 MG TABS tablet TAKE 1 TABLET BY MOUTH DAILY BEFORE BREAKFAST.   nitroGLYCERIN  (NITROSTAT ) 0.4 MG SL tablet Place 1 tablet (0.4 mg total) under the tongue every 5 (five) minutes as needed for chest pain.   Vitamin D , Ergocalciferol , (DRISDOL ) 1.25 MG (50000 UNIT) CAPS capsule TAKE 1 CAPSULE (50,000 UNITS TOTAL) BY MOUTH EVERY 7 (SEVEN) DAYS   [DISCONTINUED] Continuous Glucose Receiver (FREESTYLE LIBRE 3 READER) DEVI 1 Piece by Does not apply route once as needed for up to 1 dose.   [DISCONTINUED] Continuous Glucose Sensor (FREESTYLE LIBRE 3 PLUS SENSOR) MISC Use to monitor glucose continuously. Change sensor every 15 days.   [DISCONTINUED] insulin  degludec (TRESIBA  FLEXTOUCH) 100 UNIT/ML FlexTouch Pen INJECT 40 UNITS INTO THE SKIN AT BEDTIME.   No facility-administered encounter medications on file as of 04/22/2024.    ALLERGIES: Allergies  Allergen Reactions   Insulin  Glargine Anaphylaxis    Anaphylaxis    Lortab [Hydrocodone -Acetaminophen ] Itching   Lipitor [Atorvastatin  Calcium ] Other (See Comments)    Extreme muscle weakness, passing out, generalized pain all through body   Hydrocodone  Itching   Zocor [Simvastatin]     MYALGIAS   Iodinated Contrast Media Itching    Pt given contrast and began itching after injection.     VACCINATION STATUS: There is no immunization history for the selected administration types on file for this patient.  Diabetes He presents for his follow-up diabetic visit. He has type 2  diabetes mellitus. Onset time: He was diagnosed at approximate age of 58 years. His disease course has been worsening. There are no hypoglycemic associated symptoms. Pertinent negatives for hypoglycemia include no confusion, headaches, pallor or seizures. Pertinent negatives for diabetes include no blurred vision, no chest pain, no fatigue, no polydipsia, no polyphagia, no polyuria and no weakness. There are no hypoglycemic complications. Symptoms are worsening.  Diabetic complications include heart disease. Risk factors for coronary artery disease include dyslipidemia, diabetes mellitus, hypertension, male sex and tobacco exposure. Current diabetic treatment includes insulin  injections. His weight is fluctuating minimally. He is following a generally unhealthy diet. When asked about meal planning, he reported none. He has not had a previous visit with a dietitian. He participates in exercise intermittently. His home blood glucose trend is fluctuating minimally. His breakfast blood glucose range is generally 180-200 mg/dl. His lunch blood glucose range is generally 180-200 mg/dl. His dinner blood glucose range is generally 180-200 mg/dl. His bedtime blood glucose range is generally 180-200 mg/dl. His overall blood glucose range is 180-200 mg/dl. (He brought a page of blood glucose logs showing above target glycemic profile, monitoring randomly.  His point-of-care A1c is unchanged at 8.9%.  However, this is an improvement from 13.1% during her prior visit.  He did not document hypoglycemia.  He does have a history of coronary artery disease status post stent placement in 2017.  He did not keep his CGM, trying to monitor using a meter which is difficult to read from.) An ACE inhibitor/angiotensin II receptor blocker is not being taken.  Hyperlipidemia This is a chronic problem. The current episode started more than 1 year ago. The problem is uncontrolled. Exacerbating diseases include diabetes. Pertinent negatives  include no chest pain, myalgias or shortness of breath. He is currently on no antihyperlipidemic treatment (He does have history of statin intolerance.). Risk factors for coronary artery disease include dyslipidemia, diabetes mellitus, family history, hypertension and male sex.  Hypertension This is a chronic problem. The current episode started more than 1 year ago. Pertinent negatives include no blurred vision, chest pain, headaches, neck pain, palpitations or shortness of breath. Risk factors for coronary artery disease include dyslipidemia, diabetes mellitus, male gender and smoking/tobacco exposure. Past treatments include direct vasodilators and beta blockers. Hypertensive end-organ damage includes CAD/MI.       Objective:       04/22/2024    3:46 PM 03/23/2024    1:00 PM 03/23/2024   12:45 PM  Vitals with BMI  Height 5\' 9"     Weight 197 lbs 3 oz    BMI 29.11    Systolic 128 135 956  Diastolic 88 92 88  Pulse 112 92 92    BP 128/88   Pulse (!) 112   Ht 5\' 9"  (1.753 m)   Wt 197 lb 3.2 oz (89.4 kg)   BMI 29.12 kg/m   Wt Readings from Last 3 Encounters:  04/22/24 197 lb 3.2 oz (89.4 kg)  03/23/24 204 lb (92.5 kg)  12/22/23 196 lb 3.2 oz (89 kg)     CMP ( most recent) CMP     Component Value Date/Time   NA 141 04/14/2024 1600   K 5.1 04/14/2024 1600   CL 103 04/14/2024 1600   CO2 23 04/14/2024 1600   GLUCOSE 108 (H) 04/14/2024 1600   GLUCOSE 268 (H) 03/23/2024 1023   BUN 15 04/14/2024 1600   CREATININE 0.96 04/14/2024 1600   CALCIUM  10.3 (H) 04/14/2024 1600   PROT 7.6 04/14/2024 1600   ALBUMIN 4.9 04/14/2024 1600   AST 16 04/14/2024 1600   ALT 24 04/14/2024 1600   ALKPHOS 95 04/14/2024 1600   BILITOT 0.3 04/14/2024 1600   GFRNONAA >60 03/23/2024 1023   GFRAA >60 09/21/2019 0555   Diabetic Labs (most recent): Lab Results  Component Value Date   HGBA1C 8.9 (A) 04/22/2024   HGBA1C 8.9 (A) 12/22/2023  HGBA1C 13.1 (A) 07/25/2023     Lipid Panel      Component Value Date/Time   CHOL 244 (H) 04/14/2024 1600   TRIG 218 (H) 04/14/2024 1600   HDL 35 (L) 04/14/2024 1600   CHOLHDL 7.0 (H) 04/14/2024 1600   CHOLHDL 8.0 09/21/2019 0555   VLDL 50 (H) 09/21/2019 0555   LDLCALC 168 (H) 04/14/2024 1600   LABVLDL 41 (H) 04/14/2024 1600      Lab Results  Component Value Date   TSH 1.280 04/14/2024   TSH 0.867 07/02/2023   TSH 1.711 10/12/2021   TSH 1.925 09/21/2019   FREET4 1.12 04/14/2024     Assessment & Plan:   1. DM type 2 causing vascular disease (HCC)   - Rocky D Santone has currently uncontrolled symptomatic type 2 DM since  58 years of age.  He brought a page of blood glucose logs showing above target glycemic profile, monitoring randomly.  His point-of-care A1c is unchanged at 8.9%.  However, this is an improvement from 13.1% during her prior visit.  He did not document hypoglycemia.  He does have a history of coronary artery disease status post stent placement in 2017.  He did not keep his CGM, trying to monitor using a meter which is difficult to read from.   - I had a long discussion with him about the progressive nature of diabetes and the pathology behind its complications. -his diabetes is complicated by coronary artery disease which needed stent placement in 2017, chronic, severe hyperglycemic burden, comorbid hyperlipidemia with statin intolerance, hypertension and he remains at extremely high risk for more acute and chronic complications which include CAD, CVA, CKD, retinopathy, and neuropathy. These are all discussed in detail with him.  - I discussed all available options of managing his diabetes including de-escalation of medications. I have counseled him on diet  and weight management  by adopting a Whole Food , Plant Predominant  ( WFPP) nutrition as recommended by [employer] of Lifestyle Medicine. Patient is encouraged to switch to  unprocessed or minimally processed  complex starch, adequate protein intake  (mainly plant source), minimal liquid fat ( mainly vegetable oils), plenty of fruits, and vegetables. -  he is advised to stick to a routine mealtimes to eat 3 complete meals a day and snack only when necessary ( to snack only to correct hypoglycemia BG <70 day time or <100 at night).   - he acknowledges that there is a room for improvement in his food and drink choices. - Further Specific Suggestion is made for him to avoid simple carbohydrates  from his diet including Cakes, Sweet Desserts, Ice Cream, Soda (diet and regular), Sweet Tea, Candies, Chips, Cookies, Store Bought Juices, Alcohol in Excess of  1-2 drinks a day, Artificial Sweeteners,  Coffee Creamer, and "Sugar-free" Products. This will help patient to have more stable blood glucose profile and potentially avoid unintended weight gain.  - he will be scheduled with Penny Crumpton, RDN, CDE for individualized diabetes education.  - I have approached him with the following plan to manage  his diabetes and patient agrees:   -In light of his presentation with severe, persistent hyperglycemia, he will need multiple modality treatment in order for him to achieve control of diabetes to target.    - In preparation, he is advised to finish his current supplies of Tresiba  by taking 50 units nightly, associated with monitoring of blood glucose 4 times a day-daily before meals and at bedtime and return in  3 weeks with his meter and logs.  I discussed and prescribed a new meter for him.    He was not able to keep his CGM sensors on his skin and would like to stay off of it.   -He is tolerating and benefiting from Jardiance .  He is advised to continue Jardiance  25 mg p.o. daily at breakfast.     - he is encouraged to call clinic for blood glucose levels less than 70 or above 200 mg /dl. - This patient will be considered for Humalog  75/25 to use with breakfast and supper if he returns with significantly above target glycemic profile.     - Specific  targets for  A1c;  LDL, HDL,  and Triglycerides were discussed with the patient.  2) Blood Pressure /Hypertension: - His blood pressure is controlled to target.    he is advised to continue his current medications including metoprolol  25 mg p.o. once a day, isosorbide  30 mg p.o. twice daily.  He reports intolerance to lisinopril.   3) Lipids/Hyperlipidemia: His recent lipid panel showed uncontrolled LDL still high at 168.  He reports significant side effects from statins.  The severe dyslipidemia puts him at additional risk of coronary disease.  This makes it even more important for him to adopt plant-based nutrition discussed above.  This patient needs to be reapproached for the option.   He would also be considered for Repatha  injection given high LDL.   4)  Weight/Diet:  Body mass index is 29.12 kg/m.  -   clearly complicating his diabetes care.   he is  a candidate for weight loss. I discussed with him the fact that loss of 5 - 10% of his  current body weight will have the most impact on his diabetes management.  The above detailed WFPP Nutrition will help manage weight as well. Optimal Exercise, Restorative Sleep  information was detailed on discharge instructions.  5) Chronic Care/Health Maintenance:  -he  is not on ACEI/ARB and Statin medications and  is encouraged to initiate and continue to follow up with Ophthalmology, Dentist,  Podiatrist at least yearly or according to recommendations, and advised to   stay away from smoking. I have recommended yearly flu vaccine and pneumonia vaccine at least every 5 years; moderate intensity exercise for up to 150 minutes weekly; and  sleep for 7- 9 hours a day.  - he is  advised to maintain close follow up with his PCP for primary care needs, does not tolerate gabapentin, may need referral to pain clinic for pain management.   I spent  26  minutes in the care of the patient today including review of labs from CMP, Lipids, Thyroid  Function, Hematology  (current and previous including abstractions from other facilities); face-to-face time discussing  his blood glucose readings/logs, discussing hypoglycemia and hyperglycemia episodes and symptoms, medications doses, his options of short and long term treatment based on the latest standards of care / guidelines;  discussion about incorporating lifestyle medicine;  and documenting the encounter. Risk reduction counseling performed per USPSTF guidelines to reduce  cardiovascular risk factors.     Please refer to Patient Instructions for Blood Glucose Monitoring and Insulin /Medications Dosing Guide"  in media tab for additional information. Please  also refer to " Patient Self Inventory" in the Media  tab for reviewed elements of pertinent patient history.  Bevin D Plascencia participated in the discussions, expressed understanding, and voiced agreement with the above plans.  All questions were answered to his  satisfaction. he is encouraged to contact clinic should he have any questions or concerns prior to his return visit.   Follow up plan: - Return in about 3 weeks (around 05/13/2024) for F/U with Meter/CGM Rommie Coats Only - no Labs.  Kalvin Orf, MD [facility] [address] [place], [place] [address] Phone: [phone]  Fax: [phone]    04/22/2024, 6:30 PM  This note was partially dictated with voice recognition software. Similar sounding words can be transcribed inadequately or may not  be corrected upon review.

## 2024-04-22 NOTE — Patient Instructions (Signed)

## 2024-04-23 ENCOUNTER — Other Ambulatory Visit: Payer: Self-pay | Admitting: "Endocrinology

## 2024-04-26 ENCOUNTER — Telehealth: Payer: Self-pay

## 2024-04-26 NOTE — Telephone Encounter (Signed)
  Pt called with high BG readings.   Date Before breakfast Before lunch Before supper Bedtime  04/24/24 182 130 142 210  04/25/24 300 [phone]  04/26/24 229 290            Pt taking: Tresiba  50 units at bedtime and Jardiance  25mg  daily.  Pt states he has been experiencing increased pain in bilateral legs over the past 2 days and has been taking Ibuprofen .

## 2024-04-27 NOTE — Telephone Encounter (Signed)
 Left a message requesting pt return call to the office.

## 2024-04-28 NOTE — Telephone Encounter (Signed)
 Left a message making pt aware to increase his Tresiba  to 60 units at bedtime also if pt would like to try Lyrica to call the office back otherwise he may benefit from a referral to a pain clinic from his PCP to address the pain in his legs per Dr.Nida.

## 2024-05-05 ENCOUNTER — Ambulatory Visit

## 2024-05-05 VITALS — BP 114/74 | HR 100 | Ht 69.0 in | Wt 197.1 lb

## 2024-05-05 DIAGNOSIS — N521 Erectile dysfunction due to diseases classified elsewhere: Secondary | ICD-10-CM | POA: Diagnosis not present

## 2024-05-05 DIAGNOSIS — E1169 Type 2 diabetes mellitus with other specified complication: Secondary | ICD-10-CM | POA: Diagnosis not present

## 2024-05-05 DIAGNOSIS — E1140 Type 2 diabetes mellitus with diabetic neuropathy, unspecified: Secondary | ICD-10-CM | POA: Diagnosis not present

## 2024-05-05 DIAGNOSIS — Z794 Long term (current) use of insulin: Secondary | ICD-10-CM | POA: Diagnosis not present

## 2024-05-05 MED ORDER — AMITRIPTYLINE HCL 25 MG PO TABS: 25.0000 mg | ORAL_TABLET | Freq: Every day | ORAL | 3 refills | Status: DC

## 2024-05-05 NOTE — Progress Notes (Signed)
 New Patient Office Visit  Subjective    Patient ID: Shawn Hughes, male    DOB: [birth year]  Age: 58 y.o. MRN: [id number]  CC:  Chief Complaint  Patient presents with   Establish Care    Pt here to establish care also pt states His diabetes dr wanted him to get a couple of things checked out     HPI Shawn Hughes presents to establish care His endocrinologist wanted him to establish with PCP to determine possible treatment for his ongoing neuropathy and both feet and hands.  Outpatient Encounter Medications as of 05/05/2024  Medication Sig   amitriptyline (ELAVIL) 25 MG tablet Take 1 tablet (25 mg total) by mouth at bedtime.   aspirin  EC 81 MG tablet Take 1 tablet (81 mg total) by mouth daily. Swallow whole.   clotrimazole -betamethasone  (LOTRISONE ) cream Apply 1 Application topically daily. Apply to affected area twice daily   glucose blood (ACCU-CHEK GUIDE TEST) test strip Use to monitor glucose 4 times daily as instructed   insulin  degludec (TRESIBA  FLEXTOUCH) 100 UNIT/ML FlexTouch Pen Inject 50 Units into the skin at bedtime.   Insulin  Pen Needle (BD PEN NEEDLE NANO 2ND GEN) 32G X 4 MM MISC Use to inject insulin  daily as directed.   isosorbide  mononitrate (IMDUR ) 30 MG 24 hr tablet Take 1 tablet (30 mg total) by mouth daily.   JARDIANCE  25 MG TABS tablet TAKE 1 TABLET BY MOUTH DAILY BEFORE BREAKFAST.   nitroGLYCERIN  (NITROSTAT ) 0.4 MG SL tablet Place 1 tablet (0.4 mg total) under the tongue every 5 (five) minutes as needed for chest pain.   Vitamin D , Ergocalciferol , (DRISDOL ) 1.25 MG (50000 UNIT) CAPS capsule TAKE 1 CAPSULE (50,000 UNITS TOTAL) BY MOUTH EVERY 7 (SEVEN) DAYS   [DISCONTINUED] Blood Glucose Monitoring Suppl (FREESTYLE INSULINX SYSTEM) w/Device KIT 1 each by Other route in the morning, at noon, in the evening, and at bedtime.   No facility-administered encounter medications on file as of 05/05/2024.    Past Medical History:  Diagnosis Date   CAD (coronary  artery disease)    a. 08/2016: cath showing 40% prox RCA, 10% LCx, 20% LAD and 99% RI stenosis. DES to RI.   COVID-14 October 2019 - was not hospitalized   Essential hypertension    GERD (gastroesophageal reflux disease)    History of nephrolithiasis    Mixed hyperlipidemia    Type 2 diabetes mellitus (HCC)     Past Surgical History:  Procedure Laterality Date   CARDIAC CATHETERIZATION N/A 08/30/2016   Procedure: Left Heart Cath and Coronary Angiography;  Surgeon: Odie Benne, MD;  Location: [facility] INVASIVE CV LAB;  Service: Cardiovascular;  Laterality: N/A;   CARDIAC CATHETERIZATION N/A 08/30/2016   Procedure: Coronary Stent Intervention;  Surgeon: Odie Benne, MD;  Location: [facility] INVASIVE CV LAB;  Service: Cardiovascular; 90% prox RI - > DES PCI Promus Premier 2.5 x 16 (2.75 mm)   CORONARY PRESSURE/FFR STUDY N/A 10/12/2021   Procedure: INTRAVASCULAR PRESSURE WIRE/FFR STUDY;  Surgeon: Arleen Lacer, MD;  Location: [facility] INVASIVE CV LAB;  Service: Cardiovascular;  Laterality: N/A;   ESOPHAGOGASTRODUODENOSCOPY  01/10/2012   YQM:VHQIONGEX in the distal esophagus2O TO GERD/Mild gastritis   LACERATION REPAIR     Right arm   LEFT HEART CATH AND CORS/GRAFTS ANGIOGRAPHY N/A 10/12/2021   Procedure: LEFT HEART CATH AND CORS/GRAFTS ANGIOGRAPHY;  Surgeon: Arleen Lacer, MD;  Location: [facility] INVASIVE CV LAB;  Service: Cardiovascular;  Laterality: N/A;  Family History  Problem Relation Age of Onset   Hypertension Mother    CAD Mother 45   Heart failure Mother    Heart failure Father    Hypertension Father    Diabetes Father    CAD Father 50   Hyperlipidemia Father    Hypertension Brother    Colon cancer Neg Hx    Colon polyps Neg Hx    Esophageal cancer Neg Hx    Pancreatic cancer Neg Hx    Stomach cancer Neg Hx     Social History   Socioeconomic History   Marital status: Married    Spouse name: Not on file   Number of children: Not on file   Years of  education: Not on file   Highest education level: Associate degree: occupational, Scientist, product/process development, or vocational program  Occupational History   Not on file  Tobacco Use   Smoking status: Former    Current packs/day: 0.00    Types: Cigarettes    Start date: 09/11/1983    Quit date: 08/30/2016    Years since quitting: 7.6   Smokeless tobacco: Never  Vaping Use   Vaping status: Never Used  Substance and Sexual Activity   Alcohol use: Yes    Comment: 16-32oz daily   Drug use: No   Sexual activity: Not Currently  Other Topics Concern   Not on file  Social History Narrative   Not on file   Social Drivers of Health   Financial Resource Strain: Low Risk  (05/04/2024)   Overall Financial Resource Strain (CARDIA)    Difficulty of Paying Living Expenses: Not very hard  Food Insecurity: No Food Insecurity (05/04/2024)   Hunger Vital Sign    Worried About Running Out of Food in the Last Year: Never true    Ran Out of Food in the Last Year: Never true  Transportation Needs: No Transportation Needs (05/04/2024)   PRAPARE - Administrator, Civil Service (Medical): No    Lack of Transportation (Non-Medical): No  Physical Activity: Not on file  Stress: Stress Concern Present (05/04/2024)   [employer] of Occupational Health - Occupational Stress Questionnaire    Feeling of Stress : Rather much  Social Connections: Socially Isolated (05/04/2024)   Social Connection and Isolation Panel    Frequency of Communication with Friends and Family: Never    Frequency of Social Gatherings with Friends and Family: Never    Attends Religious Services: Never    Database administrator or Organizations: No    Attends Engineer, structural: Not on file    Marital Status: Married  Catering manager Violence: Not on file    ROS      Objective    BP 114/74   Pulse 100   Ht 5' 9 (1.753 m)   Wt 197 lb 1.9 oz (89.4 kg)   SpO2 93%   BMI 29.11 kg/m   Physical Exam Vitals and  nursing note reviewed.  Constitutional:      Appearance: Normal appearance.   Eyes:     Extraocular Movements: Extraocular movements intact.     Pupils: Pupils are equal, round, and reactive to light.    Cardiovascular:     Rate and Rhythm: Normal rate and regular rhythm.     Pulses:          Dorsalis pedis pulses are 1+ on the right side and 1+ on the left side.       Posterior tibial pulses are  1+ on the right side and 1+ on the left side.  Pulmonary:     Effort: Pulmonary effort is normal.     Breath sounds: Normal breath sounds.   Musculoskeletal:     Cervical back: Normal range of motion and neck supple.  Feet:     Right foot:     Protective Sensation: 6 sites tested.  6 sites sensed.     Skin integrity: Skin integrity normal.     Left foot:     Protective Sensation: 6 sites tested.  6 sites sensed.     Skin integrity: Skin integrity normal.     Comments: Pt's feet were very sensitive to touch  Neurological:     Mental Status: He is alert and oriented to person, place, and time.   Psychiatric:        Mood and Affect: Mood normal.        Thought Content: Thought content normal.         Assessment & Plan:   Problem List Items Addressed This Visit       Endocrine   Type 2 diabetes mellitus with diabetic neuropathy, with long-term current use of insulin  (HCC) - Primary   Patient's diabetes is managed by Endo.  Unfortunately he is unable to tolerate gabapentin for neuropathy due to severe side effects.  For this reason I am hesitant to try Lyrica.  Will add amitriptyline at bedtime, which he states he has taken in the past.  Recommend follow-up in 2 to 3 months or sooner if symptoms are not improving      Relevant Medications   amitriptyline (ELAVIL) 25 MG tablet   Other Visit Diagnoses       Erectile dysfunction associated with type 2 diabetes mellitus (HCC)       We cannot add Viagra or Cialis due to him being on nitrates for angina.  He has an appointment  with cardiology next month and recommend discussing options       No follow-ups on file.   Alison Irvine, FNP

## 2024-05-06 DIAGNOSIS — E1140 Type 2 diabetes mellitus with diabetic neuropathy, unspecified: Secondary | ICD-10-CM | POA: Insufficient documentation

## 2024-05-06 NOTE — Assessment & Plan Note (Signed)
 Patient's diabetes is managed by Endo.  Unfortunately he is unable to tolerate gabapentin for neuropathy due to severe side effects.  For this reason I am hesitant to try Lyrica.  Will add amitriptyline at bedtime, which he states he has taken in the past.  Recommend follow-up in 2 to 3 months or sooner if symptoms are not improving

## 2024-05-18 ENCOUNTER — Ambulatory Visit (INDEPENDENT_AMBULATORY_CARE_PROVIDER_SITE_OTHER): Admitting: "Endocrinology

## 2024-05-18 ENCOUNTER — Encounter: Payer: Self-pay | Admitting: "Endocrinology

## 2024-05-18 VITALS — BP 136/84 | HR 104 | Ht 69.0 in | Wt 199.0 lb

## 2024-05-18 DIAGNOSIS — Z794 Long term (current) use of insulin: Secondary | ICD-10-CM

## 2024-05-18 DIAGNOSIS — Z789 Other specified health status: Secondary | ICD-10-CM

## 2024-05-18 DIAGNOSIS — E782 Mixed hyperlipidemia: Secondary | ICD-10-CM | POA: Diagnosis not present

## 2024-05-18 DIAGNOSIS — E1159 Type 2 diabetes mellitus with other circulatory complications: Secondary | ICD-10-CM

## 2024-05-18 DIAGNOSIS — E559 Vitamin D deficiency, unspecified: Secondary | ICD-10-CM

## 2024-05-18 DIAGNOSIS — I10 Essential (primary) hypertension: Secondary | ICD-10-CM

## 2024-05-18 MED ORDER — REPATHA SURECLICK 140 MG/ML ~~LOC~~ SOAJ: 140.0000 mg | SUBCUTANEOUS | 2 refills | Status: DC

## 2024-05-18 NOTE — Patient Instructions (Signed)

## 2024-05-18 NOTE — Progress Notes (Signed)
 05/18/2024, 7:26 PM   Endocrinology follow-up note  Subjective:    Patient ID: Shawn Hughes, male    DOB: 1966-08-23.  Shawn Hughes is being seen in follow-up after he was seen in consultation for management of currently uncontrolled symptomatic diabetes .   Past Medical History:  Diagnosis Date   CAD (coronary artery disease)    a. 08/2016: cath showing 40% prox RCA, 10% LCx, 20% LAD and 99% RI stenosis. DES to RI.   COVID-14 October 2019 - was not hospitalized   Essential hypertension    GERD (gastroesophageal reflux disease)    History of nephrolithiasis    Mixed hyperlipidemia    Type 2 diabetes mellitus (HCC)     Past Surgical History:  Procedure Laterality Date   CARDIAC CATHETERIZATION N/A 08/30/2016   Procedure: Left Heart Cath and Coronary Angiography;  Surgeon: Lonni JONETTA Cash, MD;  Location: [facility] INVASIVE CV LAB;  Service: Cardiovascular;  Laterality: N/A;   CARDIAC CATHETERIZATION N/A 08/30/2016   Procedure: Coronary Stent Intervention;  Surgeon: Lonni JONETTA Cash, MD;  Location: [facility] INVASIVE CV LAB;  Service: Cardiovascular; 90% prox RI - > DES PCI Promus Premier 2.5 x 16 (2.75 mm)   CORONARY PRESSURE/FFR STUDY N/A 10/12/2021   Procedure: INTRAVASCULAR PRESSURE WIRE/FFR STUDY;  Surgeon: Anner Alm ORN, MD;  Location: [facility] INVASIVE CV LAB;  Service: Cardiovascular;  Laterality: N/A;   ESOPHAGOGASTRODUODENOSCOPY  01/10/2012   DOQ:Dumprulmz in the distal esophagus2O TO GERD/Mild gastritis   LACERATION REPAIR     Right arm   LEFT HEART CATH AND CORS/GRAFTS ANGIOGRAPHY N/A 10/12/2021   Procedure: LEFT HEART CATH AND CORS/GRAFTS ANGIOGRAPHY;  Surgeon: Anner Alm ORN, MD;  Location: [facility] INVASIVE CV LAB;  Service: Cardiovascular;  Laterality: N/A;    Social History   Socioeconomic History   Marital status: Married    Spouse name: Not on file   Number of  children: Not on file   Years of education: Not on file   Highest education level: Associate degree: occupational, Scientist, product/process development, or vocational program  Occupational History   Not on file  Tobacco Use   Smoking status: Former    Current packs/day: 0.00    Types: Cigarettes    Start date: 09/11/1983    Quit date: 08/30/2016    Years since quitting: 7.7   Smokeless tobacco: Never  Vaping Use   Vaping status: Never Used  Substance and Sexual Activity   Alcohol use: Yes    Comment: 16-32oz daily   Drug use: No   Sexual activity: Not Currently  Other Topics Concern   Not on file  Social History Narrative   Not on file   Social Drivers of Health   Financial Resource Strain: Low Risk  (05/04/2024)   Overall Financial Resource Strain (CARDIA)    Difficulty of Paying Living Expenses: Not very hard  Food Insecurity: No Food Insecurity (05/04/2024)   Hunger Vital Sign    Worried About Running Out of Food in the Last Year: Never true    Ran Out of Food in the Last Year: Never true  Transportation Needs: No Transportation Needs (05/04/2024)   PRAPARE - Administrator, Civil Service (Medical): No    Lack of Transportation (Non-Medical): No  Physical Activity: Not on file  Stress: Stress Concern Present (05/04/2024)   [employer] of Occupational Health - Occupational Stress Questionnaire    Feeling of Stress : Rather much  Social Connections: Socially Isolated (05/04/2024)   Social Connection and Isolation Panel    Frequency of Communication with Friends and Family: Never    Frequency of Social Gatherings with Friends and Family: Never    Attends Religious Services: Never    Database administrator or Organizations: No    Attends Engineer, structural: Not on file    Marital Status: Married    Family History  Problem Relation Age of Onset   Hypertension Mother    CAD Mother 36   Heart failure Mother    Heart failure Father    Hypertension Father    Diabetes  Father    CAD Father 5   Hyperlipidemia Father    Hypertension Brother    Colon cancer Neg Hx    Colon polyps Neg Hx    Esophageal cancer Neg Hx    Pancreatic cancer Neg Hx    Stomach cancer Neg Hx     Outpatient Encounter Medications as of 05/18/2024  Medication Sig   Evolocumab  (REPATHA  SURECLICK) 140 MG/ML SOAJ Inject 140 mg into the skin every 14 (fourteen) days.   insulin  degludec (TRESIBA  FLEXTOUCH) 100 UNIT/ML FlexTouch Pen Inject 60 Units into the skin at bedtime.   amitriptyline  (ELAVIL ) 25 MG tablet Take 1 tablet (25 mg total) by mouth at bedtime.   aspirin  EC 81 MG tablet Take 1 tablet (81 mg total) by mouth daily. Swallow whole.   clotrimazole -betamethasone  (LOTRISONE ) cream Apply 1 Application topically daily. Apply to affected area twice daily   glucose blood (ACCU-CHEK GUIDE TEST) test strip Use to monitor glucose 4 times daily as instructed   Insulin  Pen Needle (BD PEN NEEDLE NANO 2ND GEN) 32G X 4 MM MISC Use to inject insulin  daily as directed.   isosorbide  mononitrate (IMDUR ) 30 MG 24 hr tablet Take 1 tablet (30 mg total) by mouth daily.   JARDIANCE  25 MG TABS tablet TAKE 1 TABLET BY MOUTH DAILY BEFORE BREAKFAST.   nitroGLYCERIN  (NITROSTAT ) 0.4 MG SL tablet Place 1 tablet (0.4 mg total) under the tongue every 5 (five) minutes as needed for chest pain.   Vitamin D , Ergocalciferol , (DRISDOL ) 1.25 MG (50000 UNIT) CAPS capsule TAKE 1 CAPSULE (50,000 UNITS TOTAL) BY MOUTH EVERY 7 (SEVEN) DAYS   No facility-administered encounter medications on file as of 05/18/2024.    ALLERGIES: Allergies  Allergen Reactions   Insulin  Glargine Anaphylaxis    Anaphylaxis    Lortab [Hydrocodone -Acetaminophen ] Itching   Lipitor [Atorvastatin  Calcium ] Other (See Comments)    Extreme muscle weakness, passing out, generalized pain all through body   Gabapentin Other (See Comments)    Nightmares and suicidal thoughts   Hydrocodone  Itching   Zocor [Simvastatin]     MYALGIAS   Iodinated  Contrast Media Itching    Pt given contrast and began itching after injection.     VACCINATION STATUS: There is no immunization history for the selected administration types on file for this patient.  Diabetes He presents for his follow-up diabetic visit. He has type 2 diabetes mellitus. Onset time: He was diagnosed at approximate age of 47 years. His disease course has been improving. There  are no hypoglycemic associated symptoms. Pertinent negatives for hypoglycemia include no confusion, headaches, pallor or seizures. Pertinent negatives for diabetes include no blurred vision, no chest pain, no fatigue, no polydipsia, no polyphagia, no polyuria and no weakness. There are no hypoglycemic complications. Symptoms are improving. Diabetic complications include heart disease. Risk factors for coronary artery disease include dyslipidemia, diabetes mellitus, hypertension, male sex and tobacco exposure. Current diabetic treatment includes insulin  injections. His weight is fluctuating minimally. He is following a generally unhealthy diet. When asked about meal planning, he reported none. He has not had a previous visit with a dietitian. He participates in exercise intermittently. His home blood glucose trend is decreasing steadily. His breakfast blood glucose range is generally 140-180 mg/dl. His lunch blood glucose range is generally 140-180 mg/dl. His dinner blood glucose range is generally 140-180 mg/dl. His bedtime blood glucose range is generally 140-180 mg/dl. His overall blood glucose range is 140-180 mg/dl. (He brought in his meter and logs showing significant improvement in his glycemic profile averaging 165 for the last 2 weeks.  Recently, his point-of-care A1c was unchanged at 8.9%.  However, this is an improvement from 13.1% during her prior visit.  He did not document hypoglycemia.  He does have a history of coronary artery disease status post stent placement in 2017.  He did not keep his CGM, trying to  monitor using a meter .) An ACE inhibitor/angiotensin II receptor blocker is not being taken.  Hyperlipidemia This is a chronic problem. The current episode started more than 1 year ago. The problem is uncontrolled. Exacerbating diseases include diabetes. Pertinent negatives include no chest pain, myalgias or shortness of breath. He is currently on no antihyperlipidemic treatment (He does have history of statin intolerance.). Risk factors for coronary artery disease include dyslipidemia, diabetes mellitus, family history, hypertension and male sex.  Hypertension This is a chronic problem. The current episode started more than 1 year ago. Pertinent negatives include no blurred vision, chest pain, headaches, neck pain, palpitations or shortness of breath. Risk factors for coronary artery disease include dyslipidemia, diabetes mellitus, male gender and smoking/tobacco exposure. Past treatments include direct vasodilators and beta blockers. Hypertensive end-organ damage includes CAD/MI.       Objective:       05/18/2024    3:49 PM 05/05/2024    3:51 PM 04/22/2024    3:46 PM  Vitals with BMI  Height 5' 9 5' 9 5' 9  Weight 199 lbs 197 lbs 2 oz 197 lbs 3 oz  BMI 29.37 29.1 29.11  Systolic 136 114 871  Diastolic 84 74 88  Pulse 104 100 112    BP 136/84   Pulse (!) 104   Ht 5' 9 (1.753 m)   Wt 199 lb (90.3 kg)   BMI 29.39 kg/m   Wt Readings from Last 3 Encounters:  05/18/24 199 lb (90.3 kg)  05/05/24 197 lb 1.9 oz (89.4 kg)  04/22/24 197 lb 3.2 oz (89.4 kg)     CMP ( most recent) CMP     Component Value Date/Time   NA 141 04/14/2024 1600   K 5.1 04/14/2024 1600   CL 103 04/14/2024 1600   CO2 23 04/14/2024 1600   GLUCOSE 108 (H) 04/14/2024 1600   GLUCOSE 268 (H) 03/23/2024 1023   BUN 15 04/14/2024 1600   CREATININE 0.96 04/14/2024 1600   CALCIUM  10.3 (H) 04/14/2024 1600   PROT 7.6 04/14/2024 1600   ALBUMIN 4.9 04/14/2024 1600   AST 16 04/14/2024 1600  ALT 24 04/14/2024  1600   ALKPHOS 95 04/14/2024 1600   BILITOT 0.3 04/14/2024 1600   GFRNONAA >60 03/23/2024 1023   GFRAA >60 09/21/2019 0555   Diabetic Labs (most recent): Lab Results  Component Value Date   HGBA1C 8.9 (A) 04/22/2024   HGBA1C 8.9 (A) 12/22/2023   HGBA1C 13.1 (A) 07/25/2023     Lipid Panel     Component Value Date/Time   CHOL 244 (H) 04/14/2024 1600   TRIG 218 (H) 04/14/2024 1600   HDL 35 (L) 04/14/2024 1600   CHOLHDL 7.0 (H) 04/14/2024 1600   CHOLHDL 8.0 09/21/2019 0555   VLDL 50 (H) 09/21/2019 0555   LDLCALC 168 (H) 04/14/2024 1600   LABVLDL 41 (H) 04/14/2024 1600      Lab Results  Component Value Date   TSH 1.280 04/14/2024   TSH 0.867 07/02/2023   TSH 1.711 10/12/2021   TSH 1.925 09/21/2019   FREET4 1.12 04/14/2024     Assessment & Plan:   1. DM type 2 causing vascular disease (HCC)   - Shawn Hughes has currently uncontrolled symptomatic type 2 DM since  58 years of age.  He brought in his meter and logs showing significant improvement in his glycemic profile averaging 165 for the last 2 weeks.  Recently, his point-of-care A1c was unchanged at 8.9%.  However, this is an improvement from 13.1% during her prior visit.  He did not document hypoglycemia.  He does have a history of coronary artery disease status post stent placement in 2017.  He did not keep his CGM, trying to monitor using a meter .   - I had a long discussion with him about the progressive nature of diabetes and the pathology behind its complications. -his diabetes is complicated by coronary artery disease which needed stent placement in 2017, chronic, severe hyperglycemic burden, comorbid hyperlipidemia with statin intolerance, hypertension and he remains at extremely high risk for more acute and chronic complications which include CAD, CVA, CKD, retinopathy, and neuropathy. These are all discussed in detail with him.  - I discussed all available options of managing his diabetes including  de-escalation of medications. I have counseled him on diet  and weight management  by adopting a Whole Food , Plant Predominant  ( WFPP) nutrition as recommended by [employer] of Lifestyle Medicine. Patient is encouraged to switch to  unprocessed or minimally processed  complex starch, adequate protein intake (mainly plant source), minimal liquid fat ( mainly vegetable oils), plenty of fruits, and vegetables. -  he is advised to stick to a routine mealtimes to eat 3 complete meals a day and snack only when necessary ( to snack only to correct hypoglycemia BG <70 day time or <100 at night).   - he acknowledges that there is a room for improvement in his food and drink choices. - Further Specific Suggestion is made for him to avoid simple carbohydrates  from his diet including Cakes, Sweet Desserts, Ice Cream, Soda (diet and regular), Sweet Tea, Candies, Chips, Cookies, Store Bought Juices, Alcohol in Excess of  1-2 drinks a day, Artificial Sweeteners,  Coffee Creamer, and Sugar-free Products. This will help patient to have more stable blood glucose profile and potentially avoid unintended weight gain.   - I have approached him with the following plan to manage  his diabetes and patient agrees:   -In light of his presentation with significant improvement in his glycemic profile with only basal insulin , he will not be considered for prandial  insulin  for now.    - He is advised to continue Tresiba  60 units nightly, associated with monitoring of blood glucose at least twice a day-daily before breakfast and at bedtime. He was not able to keep his CGM sensors on his skin and would like to stay off of it.   -He is tolerating and benefiting from Jardiance .  He is advised to continue Jardiance  25 mg p.o. daily at breakfast.     - he is encouraged to call clinic for blood glucose levels less than 70 or above 200 mg /dl. - This patient will be considered for Humalog  75/25 to use with breakfast and  supper if he returns with significantly above target glycemic profile.     - Specific targets for  A1c;  LDL, HDL,  and Triglycerides were discussed with the patient.  2) Blood Pressure /Hypertension: - His blood pressure is controlled to target.    he is advised to continue his current medications including metoprolol  25 mg p.o. once a day, isosorbide  30 mg p.o. twice daily.  He reports intolerance to lisinopril.   3) Lipids/Hyperlipidemia: His recent lipid panel showed uncontrolled LDL still high at 168.  He reports significant side effects from statins.  The severe dyslipidemia puts him at additional risk of coronary disease.  This makes it even more important for him to adopt plant-based nutrition discussed above.  This patient will need intervention with the rest effective antilipid management.  I discussed and prescribed Repatha  140 mg subcutaneously every other week for him.  Apparently, he has used this product in the past.     4)  Weight/Diet:  Body mass index is 29.39 kg/m.  -     he is  a candidate for some weight loss. I discussed with him the fact that loss of 5 - 10% of his  current body weight will have the most impact on his diabetes management.  The above detailed WFPP Nutrition will help manage weight as well. Optimal Exercise, Restorative Sleep  information was detailed on discharge instructions.  5) Chronic Care/Health Maintenance:  -he  is not on ACEI/ARB and Statin medications and  is encouraged to initiate and continue to follow up with Ophthalmology, Dentist,  Podiatrist at least yearly or according to recommendations, and advised to   stay away from smoking. I have recommended yearly flu vaccine and pneumonia vaccine at least every 5 years; moderate intensity exercise for up to 150 minutes weekly; and  sleep for 7- 9 hours a day.  - he is  advised to maintain close follow up with his PCP for primary care needs, does not tolerate gabapentin, may need referral to pain  clinic for pain management.    I spent  26  minutes in the care of the patient today including review of labs from CMP, Lipids, Thyroid  Function, Hematology (current and previous including abstractions from other facilities); face-to-face time discussing  his blood glucose readings/logs, discussing hypoglycemia and hyperglycemia episodes and symptoms, medications doses, his options of short and long term treatment based on the latest standards of care / guidelines;  discussion about incorporating lifestyle medicine;  and documenting the encounter. Risk reduction counseling performed per USPSTF guidelines to reduce cardiovascular risk factors.     Please refer to Patient Instructions for Blood Glucose Monitoring and Insulin /Medications Dosing Guide  in media tab for additional information. Please  also refer to  Patient Self Inventory in the Media  tab for reviewed elements of pertinent patient history.  Shawn Hughes participated in the discussions, expressed understanding, and voiced agreement with the above plans.  All questions were answered to his satisfaction. he is encouraged to contact clinic should he have any questions or concerns prior to his return visit.   Follow up plan: - Return in about 3 months (around 08/18/2024) for Fasting Labs  in AM B4 8, A1c -NV.  Ranny Earl, MD [facility] [address] [place], [place] [address] Phone: [phone]  Fax: [phone]    05/18/2024, 7:26 PM  This note was partially dictated with voice recognition software. Similar sounding words can be transcribed inadequately or may not  be corrected upon review.

## 2024-05-19 ENCOUNTER — Other Ambulatory Visit: Payer: Self-pay | Admitting: "Endocrinology

## 2024-05-21 ENCOUNTER — Other Ambulatory Visit (HOSPITAL_COMMUNITY): Payer: Self-pay

## 2024-05-21 ENCOUNTER — Telehealth: Payer: Self-pay

## 2024-05-21 NOTE — Telephone Encounter (Signed)
 Pharmacy Patient Advocate Encounter   Received notification from RX Request Messages that prior authorization for Repatha  is required/requested.   Insurance verification completed.   The patient is insured through [employer] .   Per test claim: The current 30 day co-pay is, $35.  No PA needed at this time. This test claim was processed through [facility]- copay amounts may vary at other pharmacies due to pharmacy/plan contracts, or as the patient moves through the different stages of their insurance plan.

## 2024-05-24 ENCOUNTER — Other Ambulatory Visit: Payer: Self-pay

## 2024-05-24 DIAGNOSIS — E1159 Type 2 diabetes mellitus with other circulatory complications: Secondary | ICD-10-CM

## 2024-05-24 MED ORDER — PEN NEEDLES 32G X 6 MM MISC: 2 refills | Status: AC

## 2024-05-24 NOTE — Telephone Encounter (Signed)
 Pt made aware

## 2024-05-26 ENCOUNTER — Encounter: Payer: Self-pay | Admitting: Student

## 2024-05-26 ENCOUNTER — Ambulatory Visit: Attending: Student | Admitting: Student

## 2024-05-26 VITALS — BP 122/68 | HR 104 | Ht 69.0 in | Wt 198.0 lb

## 2024-05-26 DIAGNOSIS — N529 Male erectile dysfunction, unspecified: Secondary | ICD-10-CM

## 2024-05-26 DIAGNOSIS — I25119 Atherosclerotic heart disease of native coronary artery with unspecified angina pectoris: Secondary | ICD-10-CM | POA: Diagnosis not present

## 2024-05-26 DIAGNOSIS — R079 Chest pain, unspecified: Secondary | ICD-10-CM | POA: Diagnosis not present

## 2024-05-26 DIAGNOSIS — E782 Mixed hyperlipidemia: Secondary | ICD-10-CM | POA: Diagnosis not present

## 2024-05-26 DIAGNOSIS — I10 Essential (primary) hypertension: Secondary | ICD-10-CM

## 2024-05-26 NOTE — Patient Instructions (Signed)
 Medication Instructions:  Your physician recommends that you continue on your current medications as directed. Please refer to the Current Medication list given to you today.  *If you need a refill on your cardiac medications before your next appointment, please call your pharmacy*  Lab Work: NONE   If you have labs (blood work) drawn today and your tests are completely normal, you will receive your results only by: MyChart Message (if you have MyChart) OR A paper copy in the mail If you have any lab test that is abnormal or we need to change your treatment, we will call you to review the results.  Testing/Procedures: NONE    Follow-Up: At [facility], you and your health needs are our priority.  As part of our continuing mission to provide you with exceptional heart care, our providers are all part of one team.  This team includes your primary Cardiologist (physician) and Advanced Practice Providers or APPs (Physician Assistants and Nurse Practitioners) who all work together to provide you with the care you need, when you need it.  Your next appointment:   5-6  month(s)  Provider:   You may see Jayson Sierras, MD or one of the following Advanced Practice Providers on your designated Care Team:   Laymon Qua, PA-C  [employer], [place] Olivia Pavy, [place]     We recommend signing up for the patient portal called MyChart.  Sign up information is provided on this After Visit Summary.  MyChart is used to connect with patients for Virtual Visits (Telemedicine).  Patients are able to view lab/test results, encounter notes, upcoming appointments, etc.  Non-urgent messages can be sent to your provider as well.   To learn more about what you can do with MyChart, go to [email or web address].   Other Instructions Thank you for choosing [facility]!

## 2024-05-26 NOTE — Progress Notes (Signed)
 Cardiology Office Note    Date:  05/26/2024  ID:  Shawn Hughes, DOB 15-Apr-1966, MRN [id number] Cardiologist: Jayson Sierras, MD    History of Present Illness:    Shawn Hughes is a 58 y.o. male with past medical history of CAD (s/p DES to RI in 08/2016, cath in 09/2021 showing small caliber nondominant RCA with 90% stenosis and too small for PCI and he did have moderate ISR in the OM1/RI but not significant by FFR and medical management recommended), HTN, HLD, Type 2 DM and GERD who presents to the office today for follow-up.   He was last examined by myself in 06/2023 and reported episodes of numbness and tingling along his upper and lower extremities and had been without insulin  due to difficulty with insurance coverage. Was having some intermittent episodes of chest pain which would typically occur at rest. He had stopped all of his medical therapy in the interim and was restarted on ASA 81 mg daily and Imdur  30 mg daily. He had previously been approved for Repatha  but was not interested in restarting at that time.  In the interim, he was evaluated at [facility] ED on 03/23/2024 for chest pain. Hs troponin values were negative and EKG showed no acute ischemic changes. Did report being off all medications again and was restarted on Imdur  30 mg daily.  In talking with the patient today, he reports his episodes of chest pain actually resolved approximately 4 weeks ago after being started on Amitriptyline  for neuropathy by his PCP. Denies any recurrent episodes of pain since. Reports his breathing has been stable with no specific dyspnea on exertion, orthopnea, PND or pitting edema. He was recently prescribed Repatha  by Endocrinology but has not yet started this due to insurance issues. Reports he was informed earlier this week it was recently approved. He questions if he would be a candidate for Cialis or Viagra for ED but reviewed that this would not be an option given he is on  Imdur .  Studies Reviewed:   EKG: EKG is ordered today and demonstrates:   EKG Interpretation Date/Time:  Wednesday May 26 2024 11:29:51 EDT Ventricular Rate:  104 PR Interval:  112 QRS Duration:  82 QT Interval:  330 QTC Calculation: 433 R Axis:   70  Text Interpretation: Sinus tachycardia TWI along lateral leads. Confirmed by Johnson Grate ([id number]) on 05/26/2024 11:39:36 AM       Echocardiogram: 09/2021 IMPRESSIONS     1. Left ventricular ejection fraction, by estimation, is 55 to 60%. The  left ventricle has normal function. The left ventricle has no regional  wall motion abnormalities. There is mild left ventricular hypertrophy.  Left ventricular diastolic parameters  were normal.   2. Right ventricular systolic function is normal. The right ventricular  size is normal. Tricuspid regurgitation signal is inadequate for assessing  PA pressure.   3. The mitral valve is normal in structure. Trivial mitral valve  regurgitation. No evidence of mitral stenosis.   4. The aortic valve is tricuspid. Aortic valve regurgitation is not  visualized. No aortic stenosis is present.   5. The inferior vena cava is normal in size with greater than 50%  respiratory variability, suggesting right atrial pressure of 3 mmHg.    LHC: 09/2021   Prox LAD to Mid LAD lesion is 20% stenosed with 20% stenosed side branch in 2nd Diag.   LPAV lesion is 55% stenosed with 55% stenosed side branch in 1st LPL. ->  RFR not  significant (1.0)   1st Mrg stent is~ 60% stenosed.  RFR 0.91, 0.92 with FFR of 0.83.  Not significant   Very small caliber, nondominant prox RCA lesion is 90% stenosed.   LV end diastolic pressure is low.   There is no aortic valve stenosis.   SUMMARY Likely culprit lesion is very small caliber nondominant RCA with proximal 90% stenosis.  Too small for PCI Moderate in-stent restenosis in OM1/RI -> RFR 0.91-0.92.  R0.83.  NOT SIGNIFICANT Moderate distal LCx 50 to 60% prior to  propagation into LPL 1 and PDA -> RFR 1.  NOT SIGNIFICANT. With systolic blood pressures in the 90s to low 100s, LVEDP low.    Physical Exam:   VS:  BP 100/70   Pulse (!) 104   Ht 5' 9 (1.753 m)   Wt 198 lb (89.8 kg)   SpO2 97%   BMI 29.24 kg/m    Wt Readings from Last 3 Encounters:  05/26/24 198 lb (89.8 kg)  05/18/24 199 lb (90.3 kg)  05/05/24 197 lb 1.9 oz (89.4 kg)     GEN: Well nourished, well developed male appearing in no acute distress NECK: No JVD; No carotid bruits CARDIAC: RRR, no murmurs, rubs, gallops RESPIRATORY:  Clear to auscultation without rales, wheezing or rhonchi  ABDOMEN: Appears non-distended. No obvious abdominal masses. EXTREMITIES: No clubbing or cyanosis. No pitting edema. Distal pedal pulses are 2+ bilaterally.   Assessment and Plan:   1. CAD/Chest Pain - He previously underwent DES to RI in 08/2016 and cath in 09/2021 showed a small caliber nondominant RCA with 90% stenosis and too small for PCI and he did have moderate ISR in the OM1/RI but not significant by FFR and medical management recommended. - He was previously having chest pain but as discussed above, symptoms resolved with initiation of Amitriptyline . Will continue current medical therapy for now with ASA 81 mg daily, Imdur  30 mg daily and newly started Repatha  (previously intolerant to multiple statins due to myalgias). He also previously had significant fatigue with beta-blocker therapy.  2. HTN - Blood pressure is well-controlled at 122/68 during today's visit. Continue current medical therapy with Imdur  30 mg daily.  3. HLD - LDL was elevated at 168 when checked in 03/2024 and he was recently approved for Repatha . Was encouraged to start this. Goal LDL is less than 70 given his history of CAD.  4. ED - We reviewed that he would not be a current candidate for Cialis or Viagra given the concurrent use of Imdur . Can possibly stop this and switch to Ranexa but he wishes to continue  current medical therapy for now. Could also consider referral to Urology.  Signed, Laymon CHRISTELLA Qua, PA-C

## 2024-05-27 ENCOUNTER — Other Ambulatory Visit: Payer: Self-pay

## 2024-05-27 DIAGNOSIS — E1140 Type 2 diabetes mellitus with diabetic neuropathy, unspecified: Secondary | ICD-10-CM

## 2024-06-02 ENCOUNTER — Other Ambulatory Visit: Payer: Self-pay

## 2024-06-02 DIAGNOSIS — E1159 Type 2 diabetes mellitus with other circulatory complications: Secondary | ICD-10-CM

## 2024-06-02 DIAGNOSIS — E782 Mixed hyperlipidemia: Secondary | ICD-10-CM

## 2024-06-02 MED ORDER — REPATHA SURECLICK 140 MG/ML ~~LOC~~ SOAJ: 140.0000 mg | SUBCUTANEOUS | 2 refills | Status: DC

## 2024-06-04 ENCOUNTER — Ambulatory Visit: Admitting: Cardiology

## 2024-06-15 ENCOUNTER — Telehealth: Payer: Self-pay | Admitting: Cardiology

## 2024-06-15 MED ORDER — NITROGLYCERIN 0.4 MG SL SUBL
0.4000 mg | SUBLINGUAL_TABLET | SUBLINGUAL | 3 refills | Status: AC | PRN
Start: 2024-06-15 — End: ?

## 2024-06-15 NOTE — Telephone Encounter (Signed)
*  STAT* If patient is at the pharmacy, call can be transferred to refill team.   1. Which medications need to be refilled? (please list name of each medication and dose if known) nitroGLYCERIN (NITROSTAT) 0.4 MG SL tablet  2. Which pharmacy/location (including street and city if local pharmacy) is medication to be sent to? [id number] - [place],  - [address]  3. Do they need a 30 day or 90 day supply? 90   

## 2024-06-15 NOTE — Telephone Encounter (Signed)
 Pt's medication was sent to pt's pharmacy as requested. Confirmation received.

## 2024-06-22 ENCOUNTER — Other Ambulatory Visit: Payer: Self-pay | Admitting: Student

## 2024-08-04 ENCOUNTER — Other Ambulatory Visit: Payer: Self-pay | Admitting: "Endocrinology

## 2024-08-04 DIAGNOSIS — E1159 Type 2 diabetes mellitus with other circulatory complications: Secondary | ICD-10-CM

## 2024-08-09 ENCOUNTER — Ambulatory Visit (INDEPENDENT_AMBULATORY_CARE_PROVIDER_SITE_OTHER)

## 2024-08-09 VITALS — BP 126/85 | HR 115 | Ht 69.0 in | Wt 199.0 lb

## 2024-08-09 DIAGNOSIS — I10 Essential (primary) hypertension: Secondary | ICD-10-CM | POA: Diagnosis not present

## 2024-08-09 DIAGNOSIS — I25119 Atherosclerotic heart disease of native coronary artery with unspecified angina pectoris: Secondary | ICD-10-CM

## 2024-08-09 DIAGNOSIS — E1159 Type 2 diabetes mellitus with other circulatory complications: Secondary | ICD-10-CM

## 2024-08-09 NOTE — Progress Notes (Unsigned)
   Established Patient Office Visit  Subjective   Patient ID: Shawn Hughes, male    DOB: 12/10/1965  Age: 58 y.o. MRN: [id number]  Chief Complaint  Patient presents with   Diabetes    Three month follow up   Fatigue    Feeling fatigued and having no energy     HPI  {History (Optional):23778}  ROS    Objective:     BP 126/85   Pulse (!) 115   Ht 5' 9 (1.753 m)   Wt 199 lb (90.3 kg)   SpO2 94%   BMI 29.39 kg/m  {Vitals History (Optional):23777}  Physical Exam   No results found for any visits on 08/09/24.  {Labs (Optional):23779}  The ASCVD Risk score (Arnett DK, et al., 2019) failed to calculate for the following reasons:   Risk score cannot be calculated because patient has a medical history suggesting prior/existing ASCVD    Assessment & Plan:   Problem List Items Addressed This Visit   None   No follow-ups on file.    Leita Longs, FNP

## 2024-08-11 NOTE — Assessment & Plan Note (Signed)
 Blood pressure is well-controlled at this time.  No medication changes made today.

## 2024-08-11 NOTE — Assessment & Plan Note (Addendum)
 Blood glucose levels in the 200s, A1c 8.1. Insurance limits Tresiba  dosage increase. Hyperglycemia may cause fatigue and blurred vision. High-carb diet exacerbates hyperglycemia. - Recommended scheduling an eye exam with optometrist. - Discussed dietary modifications to reduce carbohydrate intake, avoiding breakfast bars and cereal. - Encouraged high-protein foods such as boiled eggs and [place] yogurt. - Suggested low-carb, low-sugar protein drinks. - Plan to discuss medication adjustments with endocrinologist.

## 2024-08-11 NOTE — Assessment & Plan Note (Signed)
 Managed with Repatha , but insurance denied coverage. Statin intolerance due to muscle cramps.

## 2024-09-08 LAB — LIPID PANEL
Chol/HDL Ratio: 6.5 ratio — ABNORMAL HIGH (ref 0.0–5.0)
Cholesterol, Total: 261 mg/dL — ABNORMAL HIGH (ref 100–199)
HDL: 40 mg/dL (ref >39)
LDL Chol Calc (NIH): 193 mg/dL — ABNORMAL HIGH (ref 0–99)
Triglycerides: 152 mg/dL — ABNORMAL HIGH (ref 0–149)
VLDL Cholesterol Cal: 28 mg/dL (ref 5–40)

## 2024-09-08 LAB — FIB-4 W/REFLEX TO ELF
ALT: 27 IU/L (ref 0–44)
AST: 22 IU/L (ref 0–40)
FIB-4 Index: 0.69 (ref 0.00–2.67)
Platelets: 346 x10E3/uL (ref 150–450)

## 2024-09-13 ENCOUNTER — Ambulatory Visit: Admitting: "Endocrinology

## 2024-09-13 ENCOUNTER — Encounter: Payer: Self-pay | Admitting: "Endocrinology

## 2024-09-13 VITALS — BP 138/96 | HR 56 | Ht 69.0 in | Wt 201.2 lb

## 2024-09-13 DIAGNOSIS — Z794 Long term (current) use of insulin: Secondary | ICD-10-CM

## 2024-09-13 DIAGNOSIS — E1159 Type 2 diabetes mellitus with other circulatory complications: Secondary | ICD-10-CM | POA: Diagnosis not present

## 2024-09-13 DIAGNOSIS — I10 Essential (primary) hypertension: Secondary | ICD-10-CM | POA: Diagnosis not present

## 2024-09-13 DIAGNOSIS — E782 Mixed hyperlipidemia: Secondary | ICD-10-CM

## 2024-09-13 DIAGNOSIS — K760 Fatty (change of) liver, not elsewhere classified: Secondary | ICD-10-CM | POA: Insufficient documentation

## 2024-09-13 LAB — POCT GLYCOSYLATED HEMOGLOBIN (HGB A1C): HbA1c, POC (controlled diabetic range): 8 % — AB (ref 0.0–7.0)

## 2024-09-13 MED ORDER — TRESIBA FLEXTOUCH 100 UNIT/ML ~~LOC~~ SOPN: 64.0000 [IU] | PEN_INJECTOR | Freq: Every day | SUBCUTANEOUS | 1 refills | Status: DC

## 2024-09-13 MED ORDER — LOSARTAN POTASSIUM 25 MG PO TABS: 25.0000 mg | ORAL_TABLET | Freq: Every day | ORAL | 1 refills | Status: DC

## 2024-09-13 MED ORDER — REPATHA 140 MG/ML ~~LOC~~ SOSY: 140.0000 mg | PREFILLED_SYRINGE | SUBCUTANEOUS | 3 refills | Status: AC

## 2024-09-13 NOTE — Patient Instructions (Signed)

## 2024-09-13 NOTE — Progress Notes (Signed)
 09/13/2024, 5:34 PM   Endocrinology follow-up note  Subjective:    Patient ID: Shawn Hughes, male    DOB: August 12, 1966.  Shawn Hughes is being seen in follow-up after he was seen in consultation for management of currently uncontrolled symptomatic diabetes .   Past Medical History:  Diagnosis Date   CAD (coronary artery disease)    a. 08/2016: cath showing 40% prox RCA, 10% LCx, 20% LAD and 99% RI stenosis. DES to RI.   COVID-14 October 2019 - was not hospitalized   Essential hypertension    GERD (gastroesophageal reflux disease)    History of nephrolithiasis    Mixed hyperlipidemia    Type 2 diabetes mellitus (HCC)     Past Surgical History:  Procedure Laterality Date   CARDIAC CATHETERIZATION N/A 08/30/2016   Procedure: Left Heart Cath and Coronary Angiography;  Surgeon: Lonni JONETTA Cash, MD;  Location: [facility] INVASIVE CV LAB;  Service: Cardiovascular;  Laterality: N/A;   CARDIAC CATHETERIZATION N/A 08/30/2016   Procedure: Coronary Stent Intervention;  Surgeon: Lonni JONETTA Cash, MD;  Location: [facility] INVASIVE CV LAB;  Service: Cardiovascular; 90% prox RI - > DES PCI Promus Premier 2.5 x 16 (2.75 mm)   CORONARY PRESSURE/FFR STUDY N/A 10/12/2021   Procedure: INTRAVASCULAR PRESSURE WIRE/FFR STUDY;  Surgeon: Anner Alm ORN, MD;  Location: [facility] INVASIVE CV LAB;  Service: Cardiovascular;  Laterality: N/A;   ESOPHAGOGASTRODUODENOSCOPY  01/10/2012   DOQ:Dumprulmz in the distal esophagus2O TO GERD/Mild gastritis   LACERATION REPAIR     Right arm   LEFT HEART CATH AND CORS/GRAFTS ANGIOGRAPHY N/A 10/12/2021   Procedure: LEFT HEART CATH AND CORS/GRAFTS ANGIOGRAPHY;  Surgeon: Anner Alm ORN, MD;  Location: [facility] INVASIVE CV LAB;  Service: Cardiovascular;  Laterality: N/A;    Social History   Socioeconomic History   Marital status: Married    Spouse name: Not on file   Number of  children: Not on file   Years of education: Not on file   Highest education level: Some college, no degree  Occupational History   Not on file  Tobacco Use   Smoking status: Former    Current packs/day: 0.00    Types: Cigarettes    Start date: 09/11/1983    Quit date: 08/30/2016    Years since quitting: 8.0   Smokeless tobacco: Never  Vaping Use   Vaping status: Never Used  Substance and Sexual Activity   Alcohol use: Yes    Comment: 16-32oz daily   Drug use: No   Sexual activity: Not Currently  Other Topics Concern   Not on file  Social History Narrative   Not on file   Social Drivers of Health   Financial Resource Strain: Low Risk  (08/08/2024)   Overall Financial Resource Strain (CARDIA)    Difficulty of Paying Living Expenses: Not hard at all  Food Insecurity: No Food Insecurity (08/08/2024)   Hunger Vital Sign    Worried About Running Out of Food in the Last Year: Never true    Ran Out of Food in the Last Year: Never true  Transportation  Needs: No Transportation Needs (08/08/2024)   PRAPARE - Administrator, Civil Service (Medical): No    Lack of Transportation (Non-Medical): No  Physical Activity: Insufficiently Active (08/08/2024)   Exercise Vital Sign    Days of Exercise per Week: 1 day    Minutes of Exercise per Session: 30 min  Stress: Stress Concern Present (08/08/2024)   [employer] of Occupational Health - Occupational Stress Questionnaire    Feeling of Stress: Very much  Social Connections: Socially Isolated (08/08/2024)   Social Connection and Isolation Panel    Frequency of Communication with Friends and Family: Never    Frequency of Social Gatherings with Friends and Family: Never    Attends Religious Services: Never    Database administrator or Organizations: No    Attends Engineer, structural: Not on file    Marital Status: Married    Family History  Problem Relation Age of Onset   Hypertension Mother    CAD Mother 85    Heart failure Mother    Heart failure Father    Hypertension Father    Diabetes Father    CAD Father 27   Hyperlipidemia Father    Hypertension Brother    Colon cancer Neg Hx    Colon polyps Neg Hx    Esophageal cancer Neg Hx    Pancreatic cancer Neg Hx    Stomach cancer Neg Hx     Outpatient Encounter Medications as of 09/13/2024  Medication Sig   Evolocumab  (REPATHA ) 140 MG/ML SOSY Inject 140 mg into the skin every 14 (fourteen) days.   losartan  (COZAAR ) 25 MG tablet Take 1 tablet (25 mg total) by mouth daily.   [DISCONTINUED] insulin  degludec (TRESIBA  FLEXTOUCH) 100 UNIT/ML FlexTouch Pen Inject 60 Units into the skin at bedtime. (Patient taking differently: Inject 60-62 Units into the skin at bedtime.)   aspirin  EC 81 MG tablet Take 1 tablet (81 mg total) by mouth daily. Swallow whole.   clotrimazole -betamethasone  (LOTRISONE ) cream Apply 1 Application topically daily. Apply to affected area twice daily   glucose blood (ACCU-CHEK GUIDE TEST) test strip Use to monitor glucose 4 times daily as instructed   insulin  degludec (TRESIBA  FLEXTOUCH) 100 UNIT/ML FlexTouch Pen Inject 64 Units into the skin at bedtime.   Insulin  Pen Needle (PEN NEEDLES) 32G X 6 MM MISC Use daily to inject insulin    JARDIANCE  25 MG TABS tablet TAKE 1 TABLET BY MOUTH DAILY BEFORE BREAKFAST.   nitroGLYCERIN  (NITROSTAT ) 0.4 MG SL tablet Place 1 tablet (0.4 mg total) under the tongue every 5 (five) minutes as needed for chest pain.   [DISCONTINUED] amitriptyline  (ELAVIL ) 25 MG tablet TAKE 1 TABLET BY MOUTH EVERYDAY AT BEDTIME   [DISCONTINUED] isosorbide  mononitrate (IMDUR ) 30 MG 24 hr tablet Take 1 tablet (30 mg total) by mouth daily.   No facility-administered encounter medications on file as of 09/13/2024.    ALLERGIES: Allergies  Allergen Reactions   Insulin  Glargine Anaphylaxis    Anaphylaxis    Lortab [Hydrocodone -Acetaminophen ] Itching   Lipitor [Atorvastatin  Calcium ] Other (See Comments)    Extreme  muscle weakness, passing out, generalized pain all through body   Gabapentin Other (See Comments)    Nightmares and suicidal thoughts   Hydrocodone  Itching   Zocor [Simvastatin]     MYALGIAS   Iodinated Contrast Media Itching    Pt given contrast and began itching after injection.     VACCINATION STATUS: There is no immunization history for the selected administration types  on file for this patient.  Diabetes He presents for his follow-up diabetic visit. He has type 2 diabetes mellitus. Onset time: He was diagnosed at approximate age of 47 years. His disease course has been improving. There are no hypoglycemic associated symptoms. Pertinent negatives for hypoglycemia include no confusion, headaches, pallor or seizures. Pertinent negatives for diabetes include no blurred vision, no chest pain, no fatigue, no polydipsia, no polyphagia, no polyuria and no weakness. There are no hypoglycemic complications. Symptoms are improving. Diabetic complications include heart disease. Risk factors for coronary artery disease include dyslipidemia, diabetes mellitus, hypertension, male sex and tobacco exposure. Current diabetic treatment includes insulin  injections. His weight is fluctuating minimally. He is following a generally unhealthy diet. When asked about meal planning, he reported none. He has not had a previous visit with a dietitian. He participates in exercise intermittently. His home blood glucose trend is decreasing steadily. His breakfast blood glucose range is generally 140-180 mg/dl. His bedtime blood glucose range is generally 140-180 mg/dl. His overall blood glucose range is 140-180 mg/dl. (He brought in his meter and logs showing significant improvement in his glycemic profile averaging 139-152 mg/day for the last 90 days.  His point-of-care A1c is 8% progressively improving from 13.1%.  He did not document any hypoglycemia.   He does have a history of coronary artery disease status post stent  placement in 2017.  He did not keep his CGM, trying to monitor using a meter .) An ACE inhibitor/angiotensin II receptor blocker is not being taken.  Hyperlipidemia This is a chronic problem. The current episode started more than 1 year ago. The problem is uncontrolled. Exacerbating diseases include diabetes. Pertinent negatives include no chest pain, myalgias or shortness of breath. He is currently on no antihyperlipidemic treatment (He does have history of statin intolerance.). Risk factors for coronary artery disease include dyslipidemia, diabetes mellitus, family history, hypertension and male sex.  Hypertension This is a chronic problem. The current episode started more than 1 year ago. Pertinent negatives include no blurred vision, chest pain, headaches, neck pain, palpitations or shortness of breath. Risk factors for coronary artery disease include dyslipidemia, diabetes mellitus, male gender and smoking/tobacco exposure. Past treatments include direct vasodilators and beta blockers. Hypertensive end-organ damage includes CAD/MI.     Objective:       09/13/2024    3:57 PM 08/09/2024    3:55 PM 05/26/2024   11:52 AM  Vitals with BMI  Height 5' 9 5' 9   Weight 201 lbs 3 oz 199 lbs   BMI 29.7 29.37   Systolic 138 126 877  Diastolic 96 85 68  Pulse 56 115     BP (!) 138/96   Pulse (!) 56   Ht 5' 9 (1.753 m)   Wt 201 lb 3.2 oz (91.3 kg)   BMI 29.71 kg/m   Wt Readings from Last 3 Encounters:  09/13/24 201 lb 3.2 oz (91.3 kg)  08/09/24 199 lb (90.3 kg)  05/26/24 198 lb (89.8 kg)     CMP ( most recent) CMP     Component Value Date/Time   NA 141 04/14/2024 1600   K 5.1 04/14/2024 1600   CL 103 04/14/2024 1600   CO2 23 04/14/2024 1600   GLUCOSE 108 (H) 04/14/2024 1600   GLUCOSE 268 (H) 03/23/2024 1023   BUN 15 04/14/2024 1600   CREATININE 0.96 04/14/2024 1600   CALCIUM  10.3 (H) 04/14/2024 1600   PROT 7.6 04/14/2024 1600   ALBUMIN 4.9 04/14/2024 1600  AST 22 09/07/2024  1046   ALT 27 09/07/2024 1046   ALKPHOS 95 04/14/2024 1600   BILITOT 0.3 04/14/2024 1600   GFRNONAA >60 03/23/2024 1023   GFRAA >60 09/21/2019 0555   Diabetic Labs (most recent): Lab Results  Component Value Date   HGBA1C 8.0 (A) 09/13/2024   HGBA1C 8.9 (A) 04/22/2024   HGBA1C 8.9 (A) 12/22/2023     Lipid Panel     Component Value Date/Time   CHOL 261 (H) 09/07/2024 1046   TRIG 152 (H) 09/07/2024 1046   HDL 40 09/07/2024 1046   CHOLHDL 6.5 (H) 09/07/2024 1046   CHOLHDL 8.0 09/21/2019 0555   VLDL 50 (H) 09/21/2019 0555   LDLCALC 193 (H) 09/07/2024 1046   LABVLDL 28 09/07/2024 1046      Lab Results  Component Value Date   TSH 1.280 04/14/2024   TSH 0.867 07/02/2023   TSH 1.711 10/12/2021   TSH 1.925 09/21/2019   FREET4 1.12 04/14/2024     Assessment & Plan:   1. DM type 2 causing vascular disease (HCC)   - Shawn Hughes has currently uncontrolled symptomatic type 2 DM since  58 years of age.  He brought in his meter and logs showing significant improvement in his glycemic profile averaging 139-152 mg/day for the last 90 days.  His point-of-care A1c is 8% progressively improving from 13.1%.  He did not document any hypoglycemia.   He does have a history of coronary artery disease status post stent placement in 2017.  He did not keep his CGM, trying to monitor using a meter .   - I had a long discussion with him about the progressive nature of diabetes and the pathology behind its complications. -his diabetes is complicated by coronary artery disease which needed stent placement in 2017, chronic, severe hyperglycemic burden, comorbid hyperlipidemia with statin intolerance, hypertension and he remains at extremely high risk for more acute and chronic complications which include CAD, CVA, CKD, retinopathy, and neuropathy. These are all discussed in detail with him.  - I discussed all available options of managing his diabetes including de-escalation of medications.   Patient is encouraged to switch to  unprocessed or minimally processed  complex starch, adequate protein intake (mainly plant source), minimal liquid fat ( mainly vegetable oils), plenty of fruits, and vegetables. -  he is advised to stick to a routine mealtimes to eat 3 complete meals a day and snack only when necessary ( to snack only to correct hypoglycemia BG <70 day time or <100 at night).   - he acknowledges that there is a room for improvement in his food and drink choices. - Further Specific Suggestion is made for him to avoid simple carbohydrates  from his diet including Cakes, Sweet Desserts, Ice Cream, Soda (diet and regular), Sweet Tea, Candies, Chips, Cookies, Store Bought Juices, Alcohol in Excess of  1-2 drinks a day, Artificial Sweeteners,  Coffee Creamer, and Sugar-free Products. This will help patient to have more stable blood glucose profile and potentially avoid unintended weight gain.   - I have approached him with the following plan to manage  his diabetes and patient agrees:   -In light of his presentation with significant improvement in his glycemic profile with only basal insulin , he will not be considered for prandial insulin  for now.    - To give him slightly better control, advised him to increase Tresiba  to 64 units nightly,  associated with monitoring of blood glucose at least twice a day-daily  before breakfast and at bedtime. He was not able to keep his CGM sensors on his skin and would like to stay off of it.   - He is benefiting and tolerating from SGLT2 inhibitor therapy.  He is advised to continue Jardiance  25 mg p.o. daily at breakfast.     - he is encouraged to call clinic for blood glucose levels less than 70 or above 200 mg /dl. - This patient will be considered for Humalog  75/25 to use with breakfast and supper if he returns with significantly above target glycemic profile.     - Specific targets for  A1c;  LDL, HDL,  and Triglycerides were discussed with  the patient.  2) Blood Pressure /Hypertension: His blood pressure is not controlled to target.  He may benefit from low-dose losartan  25 mg p.o. daily, since he did not continue his isosorbide .  He reports intolerance to lisinopril.   3) Lipids/Hyperlipidemia: His recent lipid panel showed uncontrolled LDL worsening to 193.  He has medical history of statin intolerance.  This patient will need intervention with PCSK9 inhibitors.  I discussed and prescribed Repatha  140 mg subcutaneously every other week for him.  Side effects and precautions discussed with him.   4)  Weight/Diet:  Body mass index is 29.71 kg/m.  -     he is  a candidate for some weight loss. I discussed with him the fact that loss of 5 - 10% of his  current body weight will have the most impact on his diabetes management.  The above detailed [facility] Nutrition will help manage weight as well. Optimal Exercise, Restorative Sleep  information was detailed on discharge instructions.  5) Chronic Care/Health Maintenance:  -he  is not on ACEI/ARB and Statin medications and  is encouraged to initiate and continue to follow up with Ophthalmology, Dentist,  Podiatrist at least yearly or according to recommendations, and advised to   stay away from smoking. I have recommended yearly flu vaccine and pneumonia vaccine at least every 5 years; moderate intensity exercise for up to 150 minutes weekly; and  sleep for 7- 9 hours a day.  - he is  advised to maintain close follow up with his PCP for primary care needs, does not tolerate gabapentin, may need referral to pain clinic for pain management.    I spent  26  minutes in the care of the patient today including review of labs from CMP, Lipids, Thyroid  Function, Hematology (current and previous including abstractions from other facilities); face-to-face time discussing  his blood glucose readings/logs, discussing hypoglycemia and hyperglycemia episodes and symptoms, medications doses, his options of  short and long term treatment based on the latest standards of care / guidelines;  discussion about incorporating lifestyle medicine;  and documenting the encounter. Risk reduction counseling performed per USPSTF guidelines to reduce cardiovascular risk factors.     Please refer to Patient Instructions for Blood Glucose Monitoring and Insulin /Medications Dosing Guide  in media tab for additional information. Please  also refer to  Patient Self Inventory in the Media  tab for reviewed elements of pertinent patient history.  Shawn Hughes participated in the discussions, expressed understanding, and voiced agreement with the above plans.  All questions were answered to his satisfaction. he is encouraged to contact clinic should he have any questions or concerns prior to his return visit.   Follow up plan: - Return in about 4 months (around 01/14/2025) for Bring Meter/CGM Device/Logs- A1c in Office.  Shawn Earl, MD  [facility] [address] [place], [place] [address] Phone: [phone]  Fax: [phone]    09/13/2024, 5:34 PM  This note was partially dictated with voice recognition software. Similar sounding words can be transcribed inadequately or may not  be corrected upon review.

## 2024-09-15 ENCOUNTER — Encounter: Payer: Self-pay | Admitting: "Endocrinology

## 2024-10-07 ENCOUNTER — Other Ambulatory Visit: Payer: Self-pay

## 2024-10-07 DIAGNOSIS — K760 Fatty (change of) liver, not elsewhere classified: Secondary | ICD-10-CM

## 2024-10-07 MED ORDER — TRESIBA FLEXTOUCH 100 UNIT/ML ~~LOC~~ SOPN: 64.0000 [IU] | PEN_INJECTOR | Freq: Every day | SUBCUTANEOUS | 1 refills | Status: DC

## 2024-10-11 ENCOUNTER — Ambulatory Visit: Payer: Self-pay

## 2024-10-11 NOTE — Telephone Encounter (Signed)
 FYI Only or Action Required?: FYI only for provider: appointment scheduled on 11/19.  Patient was last seen in primary care on 08/09/2024 by Bevely Doffing, FNP.  Called Nurse Triage reporting Hip Pain.  Symptoms began a couple of months ago.  Symptoms are: gradually worsening.  Triage Disposition: See PCP When Office is Open (Within 3 Days)  Patient/caregiver understands and will follow disposition?: Yes    Copied from CRM #[id number]. Topic: Clinical - Red Word Triage >> Oct 11, 2024  3:14 PM Hadassah PARAS wrote: Red Word that prompted transfer to Nurse Triage: Excruciating pain, Trouble with right hip and both knees, unable to climb stairs, dress himself. Pt has also fell a few times due to this.     Reason for Disposition  [1] MODERATE pain (e.g., interferes with normal activities, limping) AND [2] present > 3 days  Answer Assessment - Initial Assessment Questions 1. LOCATION and RADIATION: Where is the pain located? Does the pain spread (shoot) anywhere else?     Right hip 3. SEVERITY: How bad is the pain? What does it keep you from doing?   (Scale 1-10; or mild, moderate, severe)     Moderate to severe  4. ONSET: When did the pain start? Does it come and go, or is it there all the time?     About 2 months ago  5. WORK OR EXERCISE: Has there been any recent work or exercise that involved this part of the body?      No 6. CAUSE: What do you think is causing the hip pain?      Unsure  7. AGGRAVATING FACTORS: What makes the hip pain worse? (e.g., walking, climbing stairs, running)     Walking, climbing stairs  8. OTHER SYMPTOMS: Do you have any other symptoms? (e.g., back pain, pain shooting down leg,  fever, rash)     Bilateral knee pain, has fallen due to pain  Protocols used: Hip Pain-A-AH

## 2024-10-11 NOTE — Telephone Encounter (Signed)
Noted patient scheduled

## 2024-10-13 ENCOUNTER — Ambulatory Visit: Payer: Self-pay | Admitting: Internal Medicine

## 2024-10-13 ENCOUNTER — Ambulatory Visit (HOSPITAL_COMMUNITY)
Admission: RE | Admit: 2024-10-13 | Discharge: 2024-10-13 | Disposition: A | Discharge status: Home / Self Care | Source: Ambulatory Visit | Attending: Internal Medicine | Admitting: Internal Medicine

## 2024-10-13 VITALS — BP 128/86 | HR 102 | Ht 69.0 in | Wt 199.0 lb

## 2024-10-13 DIAGNOSIS — M25561 Pain in right knee: Secondary | ICD-10-CM | POA: Diagnosis present

## 2024-10-13 DIAGNOSIS — M25551 Pain in right hip: Secondary | ICD-10-CM | POA: Insufficient documentation

## 2024-10-13 DIAGNOSIS — G8929 Other chronic pain: Secondary | ICD-10-CM | POA: Insufficient documentation

## 2024-10-13 DIAGNOSIS — M25562 Pain in left knee: Secondary | ICD-10-CM | POA: Diagnosis present

## 2024-10-13 MED ORDER — CELECOXIB 100 MG PO CAPS: 100.0000 mg | ORAL_CAPSULE | Freq: Two times a day (BID) | ORAL | 2 refills | Status: AC | PRN

## 2024-10-13 NOTE — Assessment & Plan Note (Addendum)
 Could be related to OA of hip and/or DDD of lumbar spine Likely related to frequent bending/squatting at workplace Check x-ray of pelvis Celebrex as needed for pain

## 2024-10-13 NOTE — Assessment & Plan Note (Addendum)
 Has R > L knee pain ROM intact, but has pain upon extension Celebrex  as needed for pain Check x-ray of bilateral knees Referred to orthopedic surgery for further evaluation - unclear if meniscal or cruciate ligament tear

## 2024-10-13 NOTE — Patient Instructions (Signed)
 Please take Celecoxib as needed for knee pain.  Please follow up with Orthopedic surgery.
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
## 2024-10-19 ENCOUNTER — Ambulatory Visit: Payer: Self-pay | Admitting: Internal Medicine

## 2024-10-26 ENCOUNTER — Encounter: Payer: Self-pay | Admitting: "Endocrinology

## 2024-10-27 ENCOUNTER — Ambulatory Visit: Admitting: Orthopedic Surgery

## 2024-10-27 ENCOUNTER — Encounter: Payer: Self-pay | Admitting: Orthopedic Surgery

## 2024-10-27 ENCOUNTER — Other Ambulatory Visit: Payer: Self-pay | Admitting: "Endocrinology

## 2024-10-27 ENCOUNTER — Ambulatory Visit: Admitting: Cardiology

## 2024-10-27 ENCOUNTER — Telehealth: Payer: Self-pay

## 2024-10-27 VITALS — BP 147/93 | HR 106 | Ht 69.0 in | Wt 204.0 lb

## 2024-10-27 DIAGNOSIS — G8929 Other chronic pain: Secondary | ICD-10-CM

## 2024-10-27 DIAGNOSIS — M25561 Pain in right knee: Secondary | ICD-10-CM | POA: Diagnosis not present

## 2024-10-27 DIAGNOSIS — M25562 Pain in left knee: Secondary | ICD-10-CM | POA: Diagnosis not present

## 2024-10-27 NOTE — Patient Instructions (Signed)
Instructions Following Joint Injections  In clinic today, you received an injection in one of your joints (sometimes more than one).  Occasionally, you can have some pain at the injection site, this is normal.  You can place ice at the injection site, or take over-the-counter medications such as Tylenol (acetaminophen) or Advil (ibuprofen).  Please follow all directions listed on the bottle.  If your joint (knee or shoulder) becomes swollen, red or very painful, please contact the clinic for additional assistance.   Two medications were injected, including lidocaine and a steroid (often referred to as cortisone).  Lidocaine is effective almost immediately but wears off quickly.  However, the steroid can take a few days to improve your symptoms.  In some cases, it can make your pain worse for a couple of days.  Do not be concerned if this happens as it is common.  You can apply ice or take some over-the-counter medications as needed.   Injections in the same joint cannot be repeated for 3 months.  This helps to limit the risk of an infection in the joint.  If you were to develop an infection in your joint, the best treatment option would be surgery.      Knee Exercises  Ask your health care provider which exercises are safe for you. Do exercises exactly as told by your health care provider and adjust them as directed. It is normal to feel mild stretching, pulling, tightness, or discomfort as you do these exercises. Stop right away if you feel sudden pain or your pain gets worse. Do not begin these exercises until told by your health care provider.  Stretching and range-of-motion exercises These exercises warm up your muscles and joints and improve the movement and flexibility of your knee. These exercises also help to relieve pain and swelling.  Knee extension, prone Lie on your abdomen (prone position) on a bed. Place your left / right knee just beyond the edge of the surface so your knee is  not on the bed. You can put a towel under your left / right thigh just above your kneecap for comfort. Relax your leg muscles and allow gravity to straighten your knee (extension). You should feel a stretch behind your left / right knee. Hold this position for 10 seconds. Scoot up so your knee is supported between repetitions. Repeat 10 times. Complete this exercise 3-4 times per week.     Knee flexion, active Lie on your back with both legs straight. If this causes back discomfort, bend your left / right knee so your foot is flat on the floor. Slowly slide your left / right heel back toward your buttocks. Stop when you feel a gentle stretch in the front of your knee or thigh (flexion). Hold this position for 10 seconds. Slowly slide your left / right heel back to the starting position. Repeat 10 times. Complete this exercise 3-4 times per week.      Quadriceps stretch, prone Lie on your abdomen on a firm surface, such as a bed or padded floor. Bend your left / right knee and hold your ankle. If you cannot reach your ankle or pant leg, loop a belt around your foot and grab the belt instead. Gently pull your heel toward your buttocks. Your knee should not slide out to the side. You should feel a stretch in the front of your thigh and knee (quadriceps). Hold this position for 10 seconds. Repeat 10 times. Complete this exercise 3-4 times per week.  Hamstring, supine Lie on your back (supine position). Loop a belt or towel over the ball of your left / right foot. The ball of your foot is on the walking surface, right under your toes. Straighten your left / right knee and slowly pull on the belt to raise your leg until you feel a gentle stretch behind your knee (hamstring). Do not let your knee bend while you do this. Keep your other leg flat on the floor. Hold this position for 10 seconds. Repeat 10 times. Complete this exercise 3-4 times per week.   Strengthening exercises These  exercises build strength and endurance in your knee. Endurance is the ability to use your muscles for a long time, even after they get tired.  Quadriceps, isometric This exercise stretches the muscles in front of your thigh (quadriceps) without moving your knee joint (isometric). Lie on your back with your left / right leg extended and your other knee bent. Put a rolled towel or small pillow under your knee if told by your health care provider. Slowly tense the muscles in the front of your left / right thigh. You should see your kneecap slide up toward your hip or see increased dimpling just above the knee. This motion will push the back of the knee toward the floor. For 10 seconds, hold the muscle as tight as you can without increasing your pain. Relax the muscles slowly and completely. Repeat 10 times. Complete this exercise 3-4 times per week. .     Straight leg raises This exercise stretches the muscles in front of your thigh (quadriceps) and the muscles that move your hips (hip flexors). Lie on your back with your left / right leg extended and your other knee bent. Tense the muscles in the front of your left / right thigh. You should see your kneecap slide up or see increased dimpling just above the knee. Your thigh may even shake a bit. Keep these muscles tight as you raise your leg 4-6 inches (10-15 cm) off the floor. Do not let your knee bend. Hold this position for 10 seconds. Keep these muscles tense as you lower your leg. Relax your muscles slowly and completely after each repetition. Repeat 10 times. Complete this exercise 3-4 times per week.  Hamstring, isometric Lie on your back on a firm surface. Bend your left / right knee about 30 degrees. Dig your left / right heel into the surface as if you are trying to pull it toward your buttocks. Tighten the muscles in the back of your thighs (hamstring) to "dig" as hard as you can without increasing any pain. Hold this position for 10  seconds. Release the tension gradually and allow your muscles to relax completely for __________ seconds after each repetition. Repeat 10 times. Complete this exercise 3-4 times per week.  Hamstring curls If told by your health care provider, do this exercise while wearing ankle weights. Begin with 5 lb weights. Then increase the weight by 1 lb (0.5 kg) increments. You can also use an exercise band Lie on your abdomen with your legs straight. Bend your left / right knee as far as you can without feeling pain. Keep your hips flat against the floor. Hold this position for 10 seconds. Slowly lower your leg to the starting position. Repeat 10 times. Complete this exercise 3-4 times per week.      Squats This exercise strengthens the muscles in front of your thigh and knee (quadriceps). Stand in front of a table,  with your feet and knees pointing straight ahead. You may rest your hands on the table for balance but not for support. Slowly bend your knees and lower your hips like you are going to sit in a chair. Keep your weight over your heels, not over your toes. Keep your lower legs upright so they are parallel with the table legs. Do not let your hips go lower than your knees. Do not bend lower than told by your health care provider. If your knee pain increases, do not bend as low. Hold the squat position for 10 seconds. Slowly push with your legs to return to standing. Do not use your hands to pull yourself to standing. Repeat 10 times. Complete this exercise 3-4 times per week .     Wall slides This exercise strengthens the muscles in front of your thigh and knee (quadriceps). Lean your back against a smooth wall or door, and walk your feet out 18-24 inches (46-61 cm) from it. Place your feet hip-width apart. Slowly slide down the wall or door until your knees bend 90 degrees. Keep your knees over your heels, not over your toes. Keep your knees in line with your hips. Hold this  position for 10 seconds. Repeat 10 times. Complete this exercise 3-4 times per week.      Straight leg raises This exercise strengthens the muscles that rotate the leg at the hip and move it away from your body (hip abductors). Lie on your side with your left / right leg in the top position. Lie so your head, shoulder, knee, and hip line up. You may bend your bottom knee to help you keep your balance. Roll your hips slightly forward so your hips are stacked directly over each other and your left / right knee is facing forward. Leading with your heel, lift your top leg 4-6 inches (10-15 cm). You should feel the muscles in your outer hip lifting. Do not let your foot drift forward. Do not let your knee roll toward the ceiling. Hold this position for 10 seconds. Slowly return your leg to the starting position. Let your muscles relax completely after each repetition. Repeat 10 times. Complete this exercise 3-4 times per week.      Straight leg raises This exercise stretches the muscles that move your hips away from the front of the pelvis (hip extensors). Lie on your abdomen on a firm surface. You can put a pillow under your hips if that is more comfortable. Tense the muscles in your buttocks and lift your left / right leg about 4-6 inches (10-15 cm). Keep your knee straight as you lift your leg. Hold this position for 10 seconds. Slowly lower your leg to the starting position. Let your leg relax completely after each repetition. Repeat 10 times. Complete this exercise 3-4 times per week.

## 2024-10-27 NOTE — Telephone Encounter (Signed)
 Patient wants to know if it is possible for him to get an injection in his other knee this month before his insurance changes.  Stated he got one injection today and would like to get the other one as soon as possible. Stated we could send him a message thru his MyChart because he checks it regularly.  Please let him know.

## 2024-10-27 NOTE — Telephone Encounter (Signed)
 Dr. Onesimo states pt should wait at least a week to do opposite knee or when his glucose levels normalize. Attempted to call pt, no answer and VM was full.
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
## 2024-11-01 ENCOUNTER — Ambulatory Visit: Admitting: Cardiology

## 2025-01-03 ENCOUNTER — Ambulatory Visit: Admitting: Cardiology

## 2025-01-17 ENCOUNTER — Ambulatory Visit: Admitting: "Endocrinology

## 2025-02-07 ENCOUNTER — Ambulatory Visit
